# Patient Record
Sex: Female | Born: 2000
Health system: Southern US, Community
[De-identification: ages and names within clinical notes are randomized; demographics above are authoritative.]

## PROBLEM LIST (undated history)

## (undated) DIAGNOSIS — F329 Major depressive disorder, single episode, unspecified: Secondary | ICD-10-CM

## (undated) DIAGNOSIS — F32A Depression, unspecified: Secondary | ICD-10-CM

## (undated) DIAGNOSIS — N946 Dysmenorrhea, unspecified: Secondary | ICD-10-CM

## (undated) DIAGNOSIS — F419 Anxiety disorder, unspecified: Secondary | ICD-10-CM

## (undated) DIAGNOSIS — B259 Cytomegaloviral disease, unspecified: Secondary | ICD-10-CM

## (undated) HISTORY — DX: Depression, unspecified: F32.A

## (undated) HISTORY — DX: Cytomegaloviral disease, unspecified: B25.9

## (undated) HISTORY — DX: Major depressive disorder, single episode, unspecified: F32.9

## (undated) HISTORY — DX: Anxiety disorder, unspecified: F41.9

## (undated) HISTORY — PX: TONSILLECTOMY: SUR1361

---
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
## 2018-01-10 DIAGNOSIS — Z23 Encounter for immunization: Secondary | ICD-10-CM | POA: Diagnosis not present

## 2018-01-19 DIAGNOSIS — J301 Allergic rhinitis due to pollen: Secondary | ICD-10-CM | POA: Diagnosis not present

## 2018-01-19 DIAGNOSIS — J3081 Allergic rhinitis due to animal (cat) (dog) hair and dander: Secondary | ICD-10-CM | POA: Diagnosis not present

## 2018-01-19 DIAGNOSIS — J3089 Other allergic rhinitis: Secondary | ICD-10-CM | POA: Diagnosis not present

## 2018-03-10 DIAGNOSIS — J029 Acute pharyngitis, unspecified: Secondary | ICD-10-CM | POA: Diagnosis not present

## 2018-03-10 DIAGNOSIS — J069 Acute upper respiratory infection, unspecified: Secondary | ICD-10-CM | POA: Diagnosis not present

## 2018-03-11 ENCOUNTER — Emergency Department (HOSPITAL_COMMUNITY)
Admission: EM | Admit: 2018-03-11 | Discharge: 2018-03-11 | Disposition: A | Discharge status: Home / Self Care | Payer: 59 | Attending: Emergency Medicine | Admitting: Emergency Medicine

## 2018-03-11 ENCOUNTER — Other Ambulatory Visit: Payer: Self-pay

## 2018-03-11 ENCOUNTER — Encounter (HOSPITAL_COMMUNITY): Payer: Self-pay | Admitting: Emergency Medicine

## 2018-03-11 ENCOUNTER — Emergency Department (HOSPITAL_COMMUNITY): Payer: 59

## 2018-03-11 DIAGNOSIS — J111 Influenza due to unidentified influenza virus with other respiratory manifestations: Secondary | ICD-10-CM | POA: Insufficient documentation

## 2018-03-11 DIAGNOSIS — R69 Illness, unspecified: Secondary | ICD-10-CM

## 2018-03-11 DIAGNOSIS — R509 Fever, unspecified: Secondary | ICD-10-CM | POA: Diagnosis not present

## 2018-03-11 DIAGNOSIS — J029 Acute pharyngitis, unspecified: Secondary | ICD-10-CM | POA: Diagnosis not present

## 2018-03-11 DIAGNOSIS — R05 Cough: Secondary | ICD-10-CM | POA: Diagnosis not present

## 2018-03-11 HISTORY — DX: Dysmenorrhea, unspecified: N94.6

## 2018-03-11 MED ORDER — KETOROLAC TROMETHAMINE 30 MG/ML IJ SOLN
30.0000 mg | Freq: Once | INTRAMUSCULAR | Status: AC
  Administered 2018-03-11: 30 mg via INTRAVENOUS
  Filled 2018-03-11: qty 1

## 2018-03-11 MED ORDER — SODIUM CHLORIDE 0.9 % IV BOLUS
1000.0000 mL | Freq: Once | INTRAVENOUS | Status: AC
  Administered 2018-03-11: 1000 mL via INTRAVENOUS

## 2018-03-11 MED ORDER — ONDANSETRON 4 MG PO TBDP: ORAL_TABLET | ORAL | 0 refills | Status: DC

## 2018-03-11 MED ORDER — GUAIFENESIN 100 MG/5ML PO SOLN
10.0000 mL | Freq: Once | ORAL | Status: AC
  Administered 2018-03-11: 200 mg via ORAL
  Filled 2018-03-11: qty 20

## 2018-03-11 NOTE — ED Triage Notes (Signed)
Pt presents with flu like symptoms since Thursday. Patient complaining of cough with green sputum, chills, N/V and nasal congestion since Thursday. Patient complaining of tactile fever and chills.

## 2018-03-11 NOTE — Discharge Instructions (Addendum)
You have the flu this is a viral infection that will likely start to improve after 5-7 days, antibiotics are not helpful in treating viral infections.  You may use Zofran as needed for nausea. Since your symptoms have been present for more than 2 days Tamiflu will give no additional benefit.  Please make sure you are drinking plenty of fluids. You can treat your symptoms supportively with tylenol/ibuprofen for fevers and pains, Zyrtec and Flonase to heal with nasal congestion, and over the counter cough syrups and throat lozenges to help with cough. If your symptoms are not improving please follow up with you Primary doctor.  ° °If you develop persistent fevers, shortness of breath or difficulty breathing, chest pain, severe headache and neck pain, persistent nausea and vomiting or other new or concerning symptoms return to the Emergency department. ° °

## 2018-03-11 NOTE — ED Provider Notes (Signed)
[facility]-EMERGENCY DEPT Provider Note   CSN: [id number] Arrival date & time: 03/11/18  [id number]    History   Chief Complaint Chief Complaint  Patient presents with  . Flu Symptoms    HPI Julie [name] is a 17 y.o. female.  Julie [name] is a 17 y.o. Female who is otherwise healthy, presents to the emergency department for 2 days of subjective fevers, chills, myalgias, cough, rhinorrhea, sore throat, nausea with a few episodes of vomiting.  Since onset symptoms have been persistent and worsening.  She reports cough is intermittently productive of green sputum.  No chest pain or shortness of breath.  She has had 3 episodes of vomiting, no hematemesis, no abdominal pain.  She reports pain with swallowing and sore throat, saw her PCP for the symptoms yesterday and had negative strep test.  No pain in her ears.  Mild frontal headache but no neck stiffness or pain.  She is been taking Tylenol and Alka-Seltzer cold and flu with minimal improvement, denies any other aggravating or alleviating factors.  No specific sick contacts noted although she does work in [employer].     Past Medical History:  Diagnosis Date  . Dysmenorrhea     There are no active problems to display for this patient.   Past Surgical History:  Procedure Laterality Date  . TONSILLECTOMY       OB History   None      Home Medications    Prior to Admission medications   Not on File    Family History No family history on file.  Social History Social History   Tobacco Use  . Smoking status: Never Smoker  . Smokeless tobacco: Never Used  Substance Use Topics  . Alcohol use: Not Currently  . Drug use: Not Currently     Allergies   Patient has no known allergies.   Review of Systems Review of Systems  Constitutional: Positive for chills and fever.  HENT: Positive for congestion, rhinorrhea and sore throat. Negative for ear pain.   Eyes: Negative for visual disturbance.    Respiratory: Positive for cough. Negative for shortness of breath.   Cardiovascular: Negative for chest pain.  Gastrointestinal: Negative for abdominal pain, nausea and vomiting.  Genitourinary: Negative for dysuria and frequency.  Musculoskeletal: Negative for arthralgias and myalgias.  Skin: Negative for color change and rash.  Neurological: Negative for dizziness, syncope and headaches.     Physical Exam Updated Vital Signs BP (!) 152/107 (BP Location: Left Arm)   Pulse (!) 123   Temp 99.7 F (37.6 C) (Oral)   Resp (!) 30   LMP 02/15/2018   SpO2 97%   Physical Exam  Constitutional: She appears well-developed and well-nourished. No distress.  HENT:  Head: Normocephalic and atraumatic.  Mouth/Throat: Oropharynx is clear and moist.  TMs clear with good landmarks, moderate nasal mucosa edema with clear rhinorrhea, posterior oropharynx clear and moist, with some erythema, no edema or exudates  Eyes: Right eye exhibits no discharge. Left eye exhibits no discharge.  Neck: Neck supple.  No rigidity  Cardiovascular: Regular rhythm, normal heart sounds and intact distal pulses. Exam reveals no gallop and no friction rub.  No murmur heard. Tachycardic  Pulmonary/Chest: Effort normal and breath sounds normal. No respiratory distress.  Respirations equal and unlabored, patient able to speak in full sentences, lungs clear to auscultation bilaterally  Abdominal: Soft. Bowel sounds are normal. She exhibits no distension and no mass. There is no tenderness. There is  no guarding.  Abdomen soft, nondistended, nontender to palpation in all quadrants without guarding or peritoneal signs  Musculoskeletal: She exhibits no edema or deformity.  Neurological: She is alert. Coordination normal.  Skin: Skin is warm and dry. She is not diaphoretic.  Psychiatric: She has a normal mood and affect. Her behavior is normal.  Nursing note and vitals reviewed.    ED Treatments / Results  Labs (all  labs ordered are listed, but only abnormal results are displayed) Labs Reviewed - No data to display  EKG None  Radiology Dg Chest 2 View  Result Date: 03/11/2018 CLINICAL DATA:  17 year old female with cough. EXAM: CHEST - 2 VIEW COMPARISON:  None. FINDINGS: The heart size and mediastinal contours are within normal limits. Both lungs are clear. The visualized skeletal structures are unremarkable. IMPRESSION: No active cardiopulmonary disease. Electronically Signed   By: Elgie Collard M.D.   On: 03/11/2018 05:54    Procedures Procedures (including critical care time)  Medications Ordered in ED Medications  sodium chloride 0.9 % bolus 1,000 mL (1,000 mLs Intravenous New Bag/Given 03/11/18 0542)  ketorolac (TORADOL) 30 MG/ML injection 30 mg (30 mg Intravenous Given 03/11/18 0542)  guaiFENesin (ROBITUSSIN) 100 MG/5ML solution 200 mg (200 mg Oral Given 03/11/18 0532)     Initial Impression / Assessment and Plan / ED Course  I have reviewed the triage vital signs and the nursing notes.  Pertinent labs & imaging results that were available during my care of the patient were reviewed by me and considered in my medical decision making (see chart for details).  Patient with symptoms consistent with influenza.  Patient initially tachycardic with, low-grade fever. Improved with medication.  IV fluids given, pt tolerating PO.  Vitals significantly improved after treatment.  Lungs are clear. CXR without signs of pneumonia, or other active cardiopulmonary disease. The patient understands that symptoms are greater than the recommended 24-48 hour window of treatment, no tamiflu provided.  Patient will be discharged with instructions to orally hydrate, rest, and use over-the-counter medications such as anti-inflammatories ibuprofen and Aleve for muscle aches and Tylenol for fever.  Patient will also be given zofran for nausea.  Return precautions discussed.  Patient expresses understanding and is in  agreement with plan.  Stable for discharge home.    Final Clinical Impressions(s) / ED Diagnoses   Final diagnoses:  Influenza-like illness    ED Discharge Orders         Ordered    ondansetron (ZOFRAN ODT) 4 MG disintegrating tablet     03/11/18 0613           Dartha Lodge, PA-C 03/11/18 1610    Geoffery Lyons, MD 03/11/18 [phone]

## 2018-03-12 DIAGNOSIS — J029 Acute pharyngitis, unspecified: Secondary | ICD-10-CM | POA: Diagnosis not present

## 2018-03-14 ENCOUNTER — Emergency Department (HOSPITAL_COMMUNITY)
Admission: EM | Admit: 2018-03-14 | Discharge: 2018-03-14 | Disposition: A | Discharge status: Home / Self Care | Payer: 59 | Attending: Emergency Medicine | Admitting: Emergency Medicine

## 2018-03-14 ENCOUNTER — Encounter (HOSPITAL_COMMUNITY): Payer: Self-pay

## 2018-03-14 ENCOUNTER — Other Ambulatory Visit: Payer: Self-pay

## 2018-03-14 DIAGNOSIS — Z79899 Other long term (current) drug therapy: Secondary | ICD-10-CM | POA: Insufficient documentation

## 2018-03-14 DIAGNOSIS — M791 Myalgia, unspecified site: Secondary | ICD-10-CM | POA: Insufficient documentation

## 2018-03-14 DIAGNOSIS — R0981 Nasal congestion: Secondary | ICD-10-CM | POA: Diagnosis not present

## 2018-03-14 DIAGNOSIS — J029 Acute pharyngitis, unspecified: Secondary | ICD-10-CM

## 2018-03-14 DIAGNOSIS — R110 Nausea: Secondary | ICD-10-CM | POA: Insufficient documentation

## 2018-03-14 DIAGNOSIS — J028 Acute pharyngitis due to other specified organisms: Secondary | ICD-10-CM | POA: Diagnosis not present

## 2018-03-14 HISTORY — DX: Dysmenorrhea, unspecified: N94.6

## 2018-03-14 LAB — GROUP A STREP BY PCR: Group A Strep by PCR: NOT DETECTED

## 2018-03-14 MED ORDER — KETOROLAC TROMETHAMINE 30 MG/ML IJ SOLN
30.0000 mg | Freq: Once | INTRAMUSCULAR | Status: AC
  Administered 2018-03-14: 30 mg via INTRAVENOUS
  Filled 2018-03-14: qty 1

## 2018-03-14 MED ORDER — SODIUM CHLORIDE 0.9 % IV BOLUS
1000.0000 mL | Freq: Once | INTRAVENOUS | Status: AC
  Administered 2018-03-14: 1000 mL via INTRAVENOUS

## 2018-03-14 MED ORDER — LIDOCAINE VISCOUS HCL 2 % MT SOLN: 15.0000 mL | OROMUCOSAL | 0 refills | Status: DC | PRN

## 2018-03-14 NOTE — Discharge Instructions (Signed)
Your strep test was negative. Swish and spit lidocaine solution help with throat discomfort.  Do not swallow. Return to ED for worsening symptoms, trouble breathing or trouble swallowing, coughing or vomiting up blood, lightheadedness or loss of consciousness.

## 2018-03-14 NOTE — ED Triage Notes (Signed)
Patient c/o sore throat and nasal congestion x 5 days.

## 2018-03-14 NOTE — ED Provider Notes (Signed)
[facility]-EMERGENCY DEPT Provider Note   CSN: [id number] Arrival date & time: 03/14/18  1419     History   Chief Complaint Chief Complaint  Patient presents with  . Sore Throat  . Nasal Congestion    HPI Julie [name] is a 17 y.o. female who presents to ED for 5-day history of sore throat, generalized body aches, nasal congestion, subjective fever.  Patient seen and evaluated prior to my evaluation and was told she had flu symptoms.  She was given Augmentin.  She is here today because the pain in her throat has not resolved and she has decreased p.o. intake as a result of it.  Reports nausea but no vomiting.  No sick contacts with similar symptoms.  Denies any trouble breathing or trouble swallowing, wheezing, hemoptysis.  HPI  Past Medical History:  Diagnosis Date  . Painful menstrual periods     There are no active problems to display for this patient.   Past Surgical History:  Procedure Laterality Date  . TONSILLECTOMY       OB History   None      Home Medications    Prior to Admission medications   Medication Sig Start Date End Date Taking? Authorizing Provider  acetaminophen (TYLENOL) 500 MG tablet Take 1,000 mg by mouth daily as needed for mild pain or fever.   Yes [provider]  amoxicillin-clavulanate (AUGMENTIN) 875-125 MG tablet Take 1 tablet by mouth 2 (two) times daily. 03/12/18  Yes [provider]  cetirizine (ZYRTEC) 10 MG tablet Take 10 mg by mouth daily. 01/19/18  Yes [provider]  ibuprofen (ADVIL,MOTRIN) 200 MG tablet Take 800 mg by mouth daily as needed for fever or moderate pain.   Yes [provider]  menthol-cetylpyridinium (CEPACOL) 3 MG lozenge Take 1 lozenge by mouth as needed for sore throat.   Yes [provider]  Pseudoephedrine-APAP-DM (TYLENOL FLU PO) Take 2 tablets by mouth daily.   Yes [provider]  ibuprofen (ADVIL,MOTRIN) 800 MG tablet Take 1 tablet  (800 mg total) by mouth every 8 (eight) hours as needed for mild pain. Patient not taking: Reported on 03/14/2018 09/03/14   Ward, Layla Maw, DO  lidocaine (XYLOCAINE) 2 % solution Use as directed 15 mLs in the mouth or throat as needed for mouth pain. 03/14/18   Abrham Maslowski, PA-C  ondansetron (ZOFRAN ODT) 4 MG disintegrating tablet Take 1 tablet (4 mg total) by mouth every 8 (eight) hours as needed for nausea or vomiting. Patient not taking: Reported on 03/14/2018 09/03/14   Ward, Layla Maw, DO  traMADol (ULTRAM) 50 MG tablet Take 1 tablet (50 mg total) by mouth every 6 (six) hours as needed. Patient not taking: Reported on 03/14/2018 09/03/14   Ward, Layla Maw, DO    Family History Family History  Problem Relation Age of Onset  . Hypertension Mother   . Chronic Renal Failure Father     Social History Social History   Tobacco Use  . Smoking status: Never Smoker  . Smokeless tobacco: Never Used  Substance Use Topics  . Alcohol use: Never    Frequency: Never  . Drug use: Never     Allergies   Patient has no known allergies.   Review of Systems Review of Systems  Constitutional: Positive for chills. Negative for appetite change and fever.  HENT: Positive for congestion, rhinorrhea and sore throat. Negative for ear pain and sneezing.   Eyes: Negative for photophobia and visual  disturbance.  Respiratory: Positive for cough. Negative for chest tightness, shortness of breath and wheezing.   Cardiovascular: Negative for chest pain and palpitations.  Gastrointestinal: Positive for nausea. Negative for abdominal pain, blood in stool, constipation, diarrhea and vomiting.  Genitourinary: Negative for dysuria, hematuria and urgency.  Musculoskeletal: Positive for myalgias.  Skin: Negative for rash.  Neurological: Negative for dizziness, weakness and light-headedness.     Physical Exam Updated Vital Signs BP 117/81 (BP Location: Left Arm)   Pulse 100   Temp 99.4 F (37.4 C) (Oral)    Resp 16   Ht 5\' 6"  (1.676 m)   Wt 106.1 kg   LMP 02/15/2018 (Approximate)   SpO2 100%   BMI 37.77 kg/m   Physical Exam  Constitutional: She appears well-developed and well-nourished. No distress.  HENT:  Head: Normocephalic and atraumatic.  Nose: Nose normal.  Mouth/Throat: Uvula is midline and oropharynx is clear and moist. Tonsils are 1+ on the right. Tonsils are 1+ on the left. No tonsillar exudate.  Patient does not appear to be in acute distress. No trismus or drooling present. No pooling of secretions. Patient is tolerating secretions and is not in respiratory distress. No neck pain or tenderness to palpation of the neck. Full active and passive range of motion of the neck. No evidence of RPA or PTA.  Eyes: Conjunctivae and EOM are normal. Left eye exhibits no discharge. No scleral icterus.  Neck: Normal range of motion. Neck supple.  Cardiovascular: Regular rhythm, normal heart sounds and intact distal pulses. Tachycardia present. Exam reveals no gallop and no friction rub.  No murmur heard. Pulmonary/Chest: Effort normal and breath sounds normal. No respiratory distress.  Abdominal: Soft. Bowel sounds are normal. She exhibits no distension. There is no tenderness. There is no guarding.  Musculoskeletal: Normal range of motion. She exhibits no edema.  Neurological: She is alert. She exhibits normal muscle tone. Coordination normal.  Skin: Skin is warm and dry. No rash noted.  Psychiatric: She has a normal mood and affect.  Nursing note and vitals reviewed.    ED Treatments / Results  Labs (all labs ordered are listed, but only abnormal results are displayed) Labs Reviewed  GROUP A STREP BY PCR    EKG None  Radiology No results found.  Procedures Procedures (including critical care time)  Medications Ordered in ED Medications  sodium chloride 0.9 % bolus 1,000 mL (0 mLs Intravenous Stopped 03/14/18 1922)  ketorolac (TORADOL) 30 MG/ML injection 30 mg (30 mg  Intravenous Given 03/14/18 1802)     Initial Impression / Assessment and Plan / ED Course  I have reviewed the triage vital signs and the nursing notes.  Pertinent labs & imaging results that were available during my care of the patient were reviewed by me and considered in my medical decision making (see chart for details).     17 year old female presents for ongoing sore throat, nasal congestion and body aches.  She is already on Augmentin prescribed by another provider.  Patient initially tachycardic to 120s on arrival.  This improved with Toradol and IV fluids.  Suspect secondary to possible dehydration from decreased p.o. intake due to sore throat.  Pt rapid strep test negative. Pt is tolerating secretions, not in respiratory distress, no neck pain, no trismus. Presentation not concerning for peritonsillar abscess or spread of infection to deep spaces of the throat; patent airway. Pt will be discharged with lidocaine to swish and spit. Ibuprofen or Tylenol as needed for pain/fever. Specific return  precautions discussed. Recommended PCP follow up. Pt appears safe for discharge.    Portions of this note were generated with Scientist, clinical (histocompatibility and immunogenetics)Dragon dictation software. Dictation errors may occur despite best attempts at proofreading.  Final Clinical Impressions(s) / ED Diagnoses   Final diagnoses:  Viral pharyngitis    ED Discharge Orders         Ordered    lidocaine (XYLOCAINE) 2 % solution  As needed     03/14/18 1926           Dietrich PatesKhatri, Galo Sayed, PA-C 03/14/18 1930    Linwood DibblesKnapp, Jon, MD 03/17/18 1318

## 2018-03-16 ENCOUNTER — Encounter (HOSPITAL_COMMUNITY): Payer: Self-pay

## 2018-03-22 DIAGNOSIS — R509 Fever, unspecified: Secondary | ICD-10-CM | POA: Diagnosis not present

## 2018-03-22 DIAGNOSIS — R112 Nausea with vomiting, unspecified: Secondary | ICD-10-CM | POA: Diagnosis not present

## 2018-03-23 DIAGNOSIS — R509 Fever, unspecified: Secondary | ICD-10-CM | POA: Diagnosis not present

## 2018-04-06 DIAGNOSIS — R509 Fever, unspecified: Secondary | ICD-10-CM | POA: Diagnosis not present

## 2018-04-06 DIAGNOSIS — R634 Abnormal weight loss: Secondary | ICD-10-CM | POA: Diagnosis not present

## 2018-04-06 DIAGNOSIS — R112 Nausea with vomiting, unspecified: Secondary | ICD-10-CM | POA: Diagnosis not present

## 2018-04-11 ENCOUNTER — Encounter (HOSPITAL_COMMUNITY): Payer: Self-pay | Admitting: *Deleted

## 2018-04-11 ENCOUNTER — Inpatient Hospital Stay (HOSPITAL_COMMUNITY)
Admission: RE | Admit: 2018-04-11 | Discharge: 2018-04-13 | DRG: 885 | Disposition: A | Discharge status: Home / Self Care | Payer: 59 | Attending: Psychiatry | Admitting: Psychiatry

## 2018-04-11 ENCOUNTER — Other Ambulatory Visit: Payer: Self-pay

## 2018-04-11 DIAGNOSIS — F1210 Cannabis abuse, uncomplicated: Secondary | ICD-10-CM | POA: Diagnosis present

## 2018-04-11 DIAGNOSIS — F419 Anxiety disorder, unspecified: Secondary | ICD-10-CM | POA: Diagnosis present

## 2018-04-11 DIAGNOSIS — N390 Urinary tract infection, site not specified: Secondary | ICD-10-CM | POA: Diagnosis present

## 2018-04-11 DIAGNOSIS — R112 Nausea with vomiting, unspecified: Secondary | ICD-10-CM | POA: Diagnosis present

## 2018-04-11 DIAGNOSIS — F332 Major depressive disorder, recurrent severe without psychotic features: Secondary | ICD-10-CM | POA: Diagnosis present

## 2018-04-11 DIAGNOSIS — R45851 Suicidal ideations: Secondary | ICD-10-CM | POA: Diagnosis present

## 2018-04-11 DIAGNOSIS — G4700 Insomnia, unspecified: Secondary | ICD-10-CM | POA: Diagnosis present

## 2018-04-11 LAB — COMPREHENSIVE METABOLIC PANEL
ALK PHOS: 43 U/L (ref 38–126)
ALT: 14 U/L (ref 0–44)
AST: 11 U/L — ABNORMAL LOW (ref 15–41)
Albumin: 3.1 g/dL — ABNORMAL LOW (ref 3.5–5.0)
Anion gap: 5 (ref 5–15)
BUN: 7 mg/dL (ref 6–20)
CALCIUM: 8.5 mg/dL — AB (ref 8.9–10.3)
CO2: 23 mmol/L (ref 22–32)
CREATININE: 0.67 mg/dL (ref 0.44–1.00)
Chloride: 110 mmol/L (ref 98–111)
GFR calc non Af Amer: >60 mL/min (ref >60)
GLUCOSE: 103 mg/dL — AB (ref 70–99)
Potassium: 3.8 mmol/L (ref 3.5–5.1)
SODIUM: 138 mmol/L (ref 135–145)
Total Bilirubin: 0.1 mg/dL — ABNORMAL LOW (ref 0.3–1.2)
Total Protein: 7.1 g/dL (ref 6.5–8.1)

## 2018-04-11 LAB — CBC
HCT: 31.8 % — ABNORMAL LOW (ref 36.0–46.0)
HEMOGLOBIN: 9.5 g/dL — AB (ref 12.0–15.0)
MCH: 24.4 pg — ABNORMAL LOW (ref 26.0–34.0)
MCHC: 29.9 g/dL — ABNORMAL LOW (ref 30.0–36.0)
MCV: 81.5 fL (ref 80.0–100.0)
Platelets: 316 10*3/uL (ref 150–400)
RBC: 3.9 MIL/uL (ref 3.87–5.11)
RDW: 14.7 % (ref 11.5–15.5)
WBC: 7.9 10*3/uL (ref 4.0–10.5)
nRBC: 0 % (ref 0.0–0.2)

## 2018-04-11 LAB — URINALYSIS, ROUTINE W REFLEX MICROSCOPIC
Bilirubin Urine: NEGATIVE
GLUCOSE, UA: NEGATIVE mg/dL
Ketones, ur: NEGATIVE mg/dL
NITRITE: NEGATIVE
PH: 6 (ref 5.0–8.0)
Protein, ur: NEGATIVE mg/dL
SPECIFIC GRAVITY, URINE: 1.021 (ref 1.005–1.030)

## 2018-04-11 LAB — RAPID URINE DRUG SCREEN, HOSP PERFORMED
AMPHETAMINES: NOT DETECTED
BARBITURATES: NOT DETECTED
Benzodiazepines: NOT DETECTED
COCAINE: NOT DETECTED
Opiates: NOT DETECTED
Tetrahydrocannabinol: POSITIVE — AB

## 2018-04-11 LAB — LIPID PANEL
Cholesterol: 177 mg/dL (ref 0–200)
HDL: 40 mg/dL — ABNORMAL LOW (ref >40)
LDL CALC: 116 mg/dL — AB (ref 0–99)
TRIGLYCERIDES: 107 mg/dL (ref <150)
Total CHOL/HDL Ratio: 4.4 RATIO
VLDL: 21 mg/dL (ref 0–40)

## 2018-04-11 LAB — TSH: TSH: 2.682 u[IU]/mL (ref 0.350–4.500)

## 2018-04-11 LAB — PREGNANCY, URINE: Preg Test, Ur: NEGATIVE

## 2018-04-11 MED ORDER — ONDANSETRON HCL 4 MG PO TABS
4.0000 mg | ORAL_TABLET | Freq: Three times a day (TID) | ORAL | Status: DC | PRN
  Administered 2018-04-11: 4 mg via ORAL
  Filled 2018-04-11: qty 1

## 2018-04-11 MED ORDER — INFLUENZA VAC SPLIT QUAD 0.5 ML IM SUSY
0.5000 mL | PREFILLED_SYRINGE | INTRAMUSCULAR | Status: DC
  Filled 2018-04-11: qty 0.5

## 2018-04-11 MED ORDER — ACETAMINOPHEN 325 MG PO TABS: 650.0000 mg | ORAL_TABLET | Freq: Four times a day (QID) | ORAL | Status: DC | PRN

## 2018-04-11 MED ORDER — NORGESTIM-ETH ESTRAD TRIPHASIC 0.18/0.215/0.25 MG-35 MCG PO TABS
1.0000 | ORAL_TABLET | Freq: Every day | ORAL | Status: DC
  Administered 2018-04-11 – 2018-04-12 (×2): 1 via ORAL

## 2018-04-11 MED ORDER — HYDROXYZINE HCL 25 MG PO TABS
25.0000 mg | ORAL_TABLET | Freq: Four times a day (QID) | ORAL | Status: DC | PRN
  Administered 2018-04-11 – 2018-04-13 (×5): 25 mg via ORAL
  Filled 2018-04-11 (×5): qty 1

## 2018-04-11 MED ORDER — MAGNESIUM HYDROXIDE 400 MG/5ML PO SUSP: 30.0000 mL | Freq: Every day | ORAL | Status: DC | PRN

## 2018-04-11 MED ORDER — TRAZODONE HCL 50 MG PO TABS
50.0000 mg | ORAL_TABLET | Freq: Every evening | ORAL | Status: DC | PRN
  Administered 2018-04-11: 50 mg via ORAL
  Filled 2018-04-11 (×4): qty 1

## 2018-04-11 MED ORDER — ESCITALOPRAM OXALATE 5 MG PO TABS
5.0000 mg | ORAL_TABLET | Freq: Every day | ORAL | Status: DC
  Administered 2018-04-11 – 2018-04-13 (×3): 5 mg via ORAL
  Filled 2018-04-11 (×6): qty 1

## 2018-04-11 MED ORDER — NON FORMULARY: Freq: Every day | Status: DC

## 2018-04-11 MED ORDER — ALUM & MAG HYDROXIDE-SIMETH 200-200-20 MG/5ML PO SUSP: 30.0000 mL | ORAL | Status: DC | PRN

## 2018-04-11 NOTE — H&P (Signed)
Psychiatric Admission Assessment Adult  Patient Identification: Julie [name] MRN:  [id number] Date of Evaluation:  04/11/2018 Chief Complaint:  mdd Principal Diagnosis: <principal problem not specified> Diagnosis:   Patient Active Problem List   Diagnosis Date Noted  . MDD (major depressive disorder), recurrent episode, severe ([place]) [F33.2] 04/11/2018   History of Present Illness: Patient is seen and examined.  Patient is a 17 year old female with essentially negative past psychiatric history who presented to the behavioral health hospital as a walk-in.  Patient stated that she had been increasingly more depressed over the last several weeks, and began to think about killing herself.  She stated she was thinking about jumping off the back daily at her apartment.  She stated that it started approximately 2 weeks ago.  She is been having difficulty with nausea and vomiting.  They have been unable to come up with a cause for this.  She stated because she had been ill she had to drop out of college, and that bother her a great deal.  She admitted to helplessness, hopelessness and worthlessness.  She she admitted to crying spells.  She was admitted to the hospital for evaluation and stabilization.  Associated Signs/Symptoms: Depression Symptoms:  depressed mood, anhedonia, insomnia, psychomotor retardation, fatigue, feelings of worthlessness/guilt, difficulty concentrating, hopelessness, suicidal thoughts without plan, anxiety, loss of energy/fatigue, disturbed sleep, (Hypo) Manic Symptoms:  Impulsivity, Anxiety Symptoms:  Excessive Worry, Psychotic Symptoms:  Denied PTSD Symptoms: Negative Total Time spent with patient: 30 minutes  Past Psychiatric History: Patient admitted to one previous suicidal gesture.  She has had counseling in the past.  No medications.  Is the patient at risk to self? Yes.    Has the patient been a risk to self in the past 6 months? Yes.    Has the patient  been a risk to self within the distant past? Yes.    Is the patient a risk to others? No.  Has the patient been a risk to others in the past 6 months? No.  Has the patient been a risk to others within the distant past? No.   Prior Inpatient Therapy: Prior Inpatient Therapy: No Prior Outpatient Therapy: Prior Outpatient Therapy: No Does patient have an ACCT team?: No Does patient have Intensive In-House Services?  : No Does patient have [facility] services? : No Does patient have P4CC services?: No  Alcohol Screening: 1. How often do you have a drink containing alcohol?: Never 2. How many drinks containing alcohol do you have on a typical day when you are drinking?: 1 or 2(doesn't drink alcohol) 3. How often do you have six or more drinks on one occasion?: Never AUDIT-C Score: 0 9. Have you or someone else been injured as a result of your drinking?: No 10. Has a relative or friend or a doctor or another health worker been concerned about your drinking or suggested you cut down?: No Alcohol Use Disorder Identification Test Final Score (AUDIT): 0 Intervention/Follow-up: AUDIT Score <7 follow-up not indicated Substance Abuse History in the last 12 months:  No. Consequences of Substance Abuse: Negative Previous Psychotropic Medications: No  Psychological Evaluations: No  Past Medical History:  Past Medical History:  Diagnosis Date  . Dysmenorrhea   . Painful menstrual periods     Past Surgical History:  Procedure Laterality Date  . TONSILLECTOMY     Family History:  Family History  Problem Relation Age of Onset  . Hypertension Mother   . Chronic Renal Failure Father  Family Psychiatric  History: Patient stated her father had psychiatric issues. Tobacco Screening: Have you used any form of tobacco in the last 30 days? (Cigarettes, Smokeless Tobacco, Cigars, and/or Pipes): No Social History:  Social History   Substance and Sexual Activity  Alcohol Use Never  . Frequency: Never      Social History   Substance and Sexual Activity  Drug Use Never    Additional Social History: Marital status: Single Are you sexually active?: No What is your sexual orientation?: Heterosexual  Has your sexual activity been affected by drugs, alcohol, medication, or emotional stress?: No  Does patient have children?: No    Pain Medications: denies Prescriptions: denies Over the Counter: denies History of alcohol / drug use?: No history of alcohol / drug abuse Longest period of sobriety (when/how long): N/A                    Allergies:  No Known Allergies Lab Results:  Results for orders placed or performed during the hospital encounter of 04/11/18 (from the past 48 hour(s))  CBC     Status: Abnormal   Collection Time: 04/11/18  6:28 AM  Result Value Ref Range   WBC 7.9 4.0 - 10.5 K/uL   RBC 3.90 3.87 - 5.11 MIL/uL   Hemoglobin 9.5 (L) 12.0 - 15.0 g/dL   HCT 31.8 (L) 36.0 - 46.0 %   MCV 81.5 80.0 - 100.0 fL   MCH 24.4 (L) 26.0 - 34.0 pg   MCHC 29.9 (L) 30.0 - 36.0 g/dL   RDW 14.7 11.5 - 15.5 %   Platelets 316 150 - 400 K/uL   nRBC 0.0 0.0 - 0.2 %    Comment: Performed at [facility], [place] [address], [place], [place] [address]  Comprehensive metabolic panel     Status: Abnormal   Collection Time: 04/11/18  6:28 AM  Result Value Ref Range   Sodium 138 135 - 145 mmol/L   Potassium 3.8 3.5 - 5.1 mmol/L   Chloride 110 98 - 111 mmol/L   CO2 23 22 - 32 mmol/L   Glucose, Bld 103 (H) 70 - 99 mg/dL   BUN 7 6 - 20 mg/dL   Creatinine, Ser 0.67 0.44 - 1.00 mg/dL   Calcium 8.5 (L) 8.9 - 10.3 mg/dL   Total Protein 7.1 6.5 - 8.1 g/dL   Albumin 3.1 (L) 3.5 - 5.0 g/dL   AST 11 (L) 15 - 41 U/L   ALT 14 0 - 44 U/L   Alkaline Phosphatase 43 38 - 126 U/L   Total Bilirubin 0.1 (L) 0.3 - 1.2 mg/dL   GFR calc non Af Amer >60 >60 mL/min   GFR calc Af Amer >60 >60 mL/min    Comment: (NOTE) The eGFR has been calculated using the CKD EPI equation. This  calculation has not been validated in all clinical situations. eGFR's persistently <60 mL/min signify possible Chronic Kidney Disease.    Anion gap 5 5 - 15    Comment: Performed at [facility], [place] [address] [place], [place] [address]  Lipid panel     Status: Abnormal   Collection Time: 04/11/18  6:28 AM  Result Value Ref Range   Cholesterol 177 0 - 200 mg/dL   Triglycerides 107 <150 mg/dL   HDL 40 (L) >40 mg/dL   Total CHOL/HDL Ratio 4.4 RATIO   VLDL 21 0 - 40 mg/dL   LDL Cholesterol 116 (H) 0 - 99 mg/dL  Comment:        Total Cholesterol/HDL:CHD Risk Coronary Heart Disease Risk Table                     Men   Women  1/2 Average Risk   3.4   3.3  Average Risk       5.0   4.4  2 X Average Risk   9.6   7.1  3 X Average Risk  23.4   11.0        Use the calculated Patient Ratio above and the CHD Risk Table to determine the patient's CHD Risk.        ATP III CLASSIFICATION (LDL):  <100     mg/dL   Optimal  100-129  mg/dL   Near or Above                    Optimal  130-159  mg/dL   Borderline  160-189  mg/dL   High  >190     mg/dL   Very High Performed at [facility] [address] [place], [place] [address]   TSH     Status: None   Collection Time: 04/11/18  6:28 AM  Result Value Ref Range   TSH 2.682 0.350 - 4.500 uIU/mL    Comment: Performed by a 3rd Generation assay with a functional sensitivity of <=0.01 uIU/mL. Performed at [facility], [place] [address] [place], [place] [address]     Blood Alcohol level:  No results found for: [facility]  Metabolic Disorder Labs:  No results found for: HGBA1C, MPG No results found for: PROLACTIN Lab Results  Component Value Date   CHOL 177 04/11/2018   TRIG 107 04/11/2018   HDL 40 (L) 04/11/2018   CHOLHDL 4.4 04/11/2018   VLDL 21 04/11/2018   LDLCALC 116 (H) 04/11/2018    Current Medications: Current Facility-Administered Medications  Medication Dose Route  Frequency Provider Last Rate Last Dose  . acetaminophen (TYLENOL) tablet 650 mg  650 mg Oral Q6H PRN Laverle Hobby, PA-C      . alum & mag hydroxide-simeth (MAALOX/MYLANTA) 200-200-20 MG/5ML suspension 30 mL  30 mL Oral Q4H PRN Patriciaann Clan E, PA-C      . escitalopram (LEXAPRO) tablet 5 mg  5 mg Oral Daily Sharma Covert, MD   5 mg at 04/11/18 0839  . hydrOXYzine (ATARAX/VISTARIL) tablet 25 mg  25 mg Oral Q6H PRN Laverle Hobby, PA-C   25 mg at 04/11/18 1109  . [START ON 04/12/2018] Influenza vac split quadrivalent PF (FLUARIX) injection 0.5 mL  0.5 mL Intramuscular Tomorrow-1000 Cobos, Fernando A, MD      . magnesium hydroxide (MILK OF MAGNESIA) suspension 30 mL  30 mL Oral Daily PRN Patriciaann Clan E, PA-C      . ondansetron (ZOFRAN) tablet 4 mg  4 mg Oral Q8H PRN Sharma Covert, MD   4 mg at 04/11/18 1122  . traZODone (DESYREL) tablet 50 mg  50 mg Oral QHS,MR X 1 Simon, Spencer E, PA-C       PTA Medications: Medications Prior to Admission  Medication Sig Dispense Refill Last Dose  . acetaminophen (TYLENOL) 500 MG tablet Take 1,000 mg by mouth daily as needed for mild pain or fever.   03/13/2018 at Unknown time  . amoxicillin-clavulanate (AUGMENTIN) 875-125 MG tablet Take 1 tablet by mouth 2 (two) times daily.  0 03/13/2018 at Unknown time  . cetirizine (ZYRTEC)  10 MG tablet Take 10 mg by mouth daily.  5 Past Week at Unknown time  . ibuprofen (ADVIL,MOTRIN) 200 MG tablet Take 800 mg by mouth daily as needed for fever or moderate pain.   03/13/2018 at Unknown time  . ibuprofen (ADVIL,MOTRIN) 800 MG tablet Take 1 tablet (800 mg total) by mouth every 8 (eight) hours as needed for mild pain. (Patient not taking: Reported on 03/14/2018) 30 tablet 0 Not Taking at Unknown time  . lidocaine (XYLOCAINE) 2 % solution Use as directed 15 mLs in the mouth or throat as needed for mouth pain. 100 mL 0   . menthol-cetylpyridinium (CEPACOL) 3 MG lozenge Take 1 lozenge by mouth as needed for sore  throat.   Past Week at Unknown time  . ondansetron (ZOFRAN ODT) 4 MG disintegrating tablet Take 1 tablet (4 mg total) by mouth every 8 (eight) hours as needed for nausea or vomiting. (Patient not taking: Reported on 03/14/2018) 20 tablet 0 Not Taking at Unknown time  . ondansetron (ZOFRAN ODT) 4 MG disintegrating tablet 43m ODT q4 hours prn nausea/vomit (Patient taking differently: 8 mg 3 (three) times daily as needed for nausea or vomiting. 474mODT q4 hours prn nausea/vomit) 8 tablet 0   . Pseudoephedrine-APAP-DM (TYLENOL FLU PO) Take 2 tablets by mouth daily.   03/13/2018 at Unknown time  . traMADol (ULTRAM) 50 MG tablet Take 1 tablet (50 mg total) by mouth every 6 (six) hours as needed. (Patient not taking: Reported on 03/14/2018) 15 tablet 0 Not Taking at Unknown time    Musculoskeletal: Strength & Muscle Tone: within normal limits Gait & Station: normal Patient leans: N/A  Psychiatric Specialty Exam: Physical Exam  Nursing note and vitals reviewed. Constitutional: She is oriented to person, place, and time. She appears well-developed and well-nourished.  HENT:  Head: Normocephalic and atraumatic.  Respiratory: Effort normal.  Neurological: She is alert and oriented to person, place, and time.    ROS  Blood pressure 139/84, pulse 80, temperature 98 F (36.7 C), temperature source Oral, resp. rate 16, height 5' 6"  (1.676 m), weight 103.4 kg.Body mass index is 36.8 kg/m.  General Appearance: Disheveled  Eye Contact:  Fair  Speech:  Normal Rate  Volume:  Decreased  Mood:  Anxious and Depressed  Affect:  Congruent  Thought Process:  Coherent and Descriptions of Associations: Intact  Orientation:  Full (Time, Place, and Person)  Thought Content:  Logical  Suicidal Thoughts:  Yes.  without intent/plan  Homicidal Thoughts:  No  Memory:  Immediate;   Fair Recent;   Fair Remote;   Fair  Judgement:  Intact  Insight:  Fair  Psychomotor Activity:  Psychomotor Retardation   Concentration:  Concentration: Fair and Attention Span: Fair  Recall:  [employer] Knowledge:  Fair  Language:  Fair  Akathisia:  Negative  Handed:  Right  AIMS (if indicated):     Assets:  Desire for Improvement Financial Resources/Insurance Housing Physical Health Resilience Social Support Talents/Skills  ADL's:  Intact  Cognition:  WNL  Sleep:  Number of Hours: 2.5    Treatment Plan Summary: Daily contact with patient to assess and evaluate symptoms and progress in treatment, Medication management and Plan : Patient is seen and examined.  Patient is a [age]ear old female with essentially negative past psychiatric history with the first episode of major depression.  She will be admitted to the hospital.  She will be integrated into the milieu.  She will be encouraged to attend groups.  She will be started on Lexapro 5 mg p.o. daily.  She will also have available hydroxyzine 25 mg p.o. every 6 hours as needed anxiety.  She will be given Zofran 4 mg p.o. every 6 hours as needed nausea.  Observation Level/Precautions:  15 minute checks  Laboratory:  Chemistry Profile  Psychotherapy:    Medications:    Consultations:    Discharge Concerns:    Estimated LOS:  Other:     Physician Treatment Plan for Primary Diagnosis: <principal problem not specified> Long Term Goal(s): Improvement in symptoms so as ready for discharge  Short Term Goals: Ability to identify changes in lifestyle to reduce recurrence of condition will improve, Ability to verbalize feelings will improve, Ability to disclose and discuss suicidal ideas, Ability to demonstrate self-control will improve, Ability to identify and develop effective coping behaviors will improve and Ability to maintain clinical measurements within normal limits will improve  Physician Treatment Plan for Secondary Diagnosis: Active Problems:   MDD (major depressive disorder), recurrent episode, severe ([place])  Long Term Goal(s): Improvement  in symptoms so as ready for discharge  Short Term Goals: Ability to identify changes in lifestyle to reduce recurrence of condition will improve, Ability to verbalize feelings will improve, Ability to disclose and discuss suicidal ideas, Ability to demonstrate self-control will improve, Ability to identify and develop effective coping behaviors will improve and Ability to maintain clinical measurements within normal limits will improve  I certify that inpatient services furnished can reasonably be expected to improve the patient's condition.    Sharma Covert, MD 10/22/201912:24 PM

## 2018-04-11 NOTE — Progress Notes (Signed)
Pt is a 17 year old female admitted voluntarily to [facility] [place] for SI.  She reports she is here because "I don't really want to be alone."  She reports "having intense thoughts of jumping off my balcony."  Pt reports depression and SI for approximately 2 months that is "getting worse."  She endorses passive SI during admission assessment.  Pt verbally contracts for safety at [facility].  Denies HI, denies hallucinations, denies pain.  She is tearful at times during admission assessment.  Her voice is soft and she presents with anxious, depressed affect and mood.  She denies having support system.  She reports she doesn't know what triggers her SI.  Pt reports she lives alone and will return to same living situation.  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  She also reports she is "on leave" from her job at [employer].    Introduced self to pt.  Actively listened to pt and provided support and encouragement.  Admission process and paperwork completed with pt.  Non-invasive body assessment completed by female Charity fundraiser.  Pt has superficial scratches to abdomen and legs.  Belongings searched for contraband and items not allowed on unit are in locker.  Pt's birth control pills are in her medication drawer.  Fall prevention techniques reviewed with pt and she verbalized understanding.  Pt provided with PO fluids.  PRN medication for anxiety administered.  Pt oriented to unit and room.  Urine specimen obtained.  Pt is on Q15 minute safety checks.  Pt is cooperative with admission process.  She verbally contracts for safety and reports she will inform staff of needs and concerns.  She is safe on the unit.  Will continue to monitor and assess.

## 2018-04-11 NOTE — Plan of Care (Signed)
Nurse discussed anxiety, depression, coping skills with patient. 

## 2018-04-11 NOTE — [facility] Suicide Risk Assessment (Signed)
[facility] INPATIENT:  Family/Significant Other Suicide Prevention Education  Suicide Prevention Education:  Patient Refusal for Family/Significant Other Suicide Prevention Education: The patient Julie [name] has refused to provide written consent for family/significant other to be provided Family/Significant Other Suicide Prevention Education during admission and/or prior to discharge.  Physician notified.  SPE completed with patient, as patient refused to consent to family contact. Patient was encouraged to share information with support network, ask questions, and talk about any concerns relating to SPE. Patient denies access to guns/firearms and verbalized understanding of information provided. Mobile Crisis information also provided to patient.    [name] 04/11/2018, 10:54 AM

## 2018-04-11 NOTE — [facility] Suicide Risk Assessment (Signed)
[facility] Admission Suicide Risk Assessment   Nursing information obtained from:  Patient Demographic factors:  Living alone, Adolescent or young adult Current Mental Status:  Suicidal ideation indicated by patient Loss Factors:  NA Historical Factors:  Family history of mental illness or substance abuse Risk Reduction Factors:  Employed  Total Time spent with patient: 20 minutes Principal Problem: <principal problem not specified> Diagnosis:   Patient Active Problem List   Diagnosis Date Noted  . MDD (major depressive disorder), recurrent episode, severe (HCC) [F33.2] 04/11/2018   Subjective Data: Patient is seen and examined.  Patient is a 17 year old female with essentially negative past psychiatric history who presented to the behavioral health hospital as a walk-in.  Patient stated that she had been increasingly more depressed over the last several weeks, and began to think about killing herself.  She stated she was thinking about jumping off the back daily at her apartment.  She stated that it started approximately 2 weeks ago.  She is been having difficulty with nausea and vomiting.  They have been unable to come up with a cause for this.  She stated because she had been ill she had to drop out of college, and that bother her a great deal.  She admitted to helplessness, hopelessness and worthlessness.  She she admitted to crying spells.  She was admitted to the hospital for evaluation and stabilization.  Continued Clinical Symptoms:  Alcohol Use Disorder Identification Test Final Score (AUDIT): 0 The "Alcohol Use Disorders Identification Test", Guidelines for Use in Primary Care, Second Edition.  World Science writer [facility]). Score between 0-7:  no or low risk or alcohol related problems. Score between 8-15:  moderate risk of alcohol related problems. Score between 16-19:  high risk of alcohol related problems. Score 20 or above:  warrants further diagnostic evaluation for alcohol  dependence and treatment.   CLINICAL FACTORS:   Depression:   Anhedonia Hopelessness Impulsivity Insomnia   Musculoskeletal: Strength & Muscle Tone: within normal limits Gait & Station: normal Patient leans: N/A  Psychiatric Specialty Exam: Physical Exam  Nursing note and vitals reviewed. Constitutional: She is oriented to person, place, and time. She appears well-developed and well-nourished.  HENT:  Head: Normocephalic and atraumatic.  Respiratory: Effort normal.  Neurological: She is alert and oriented to person, place, and time.    ROS  Blood pressure 130/67, pulse 85, temperature 98.7 F (37.1 C), temperature source Oral, resp. rate 16, height 5\' 6"  (1.676 m), weight 103.4 kg.Body mass index is 36.8 kg/m.  General Appearance: Casual  Eye Contact:  Fair  Speech:  Slow  Volume:  Decreased  Mood:  Depressed  Affect:  Congruent  Thought Process:  Coherent and Descriptions of Associations: Intact  Orientation:  Full (Time, Place, and Person)  Thought Content:  Logical  Suicidal Thoughts:  Yes.  without intent/plan  Homicidal Thoughts:  No  Memory:  Immediate;   Fair Recent;   Fair Remote;   Fair  Judgement:  Intact  Insight:  Fair  Psychomotor Activity:  Decreased  Concentration:  Concentration: Fair and Attention Span: Fair  Recall:  [employer] of Knowledge:  Good  Language:  Fair  Akathisia:  Negative  Handed:  Right  AIMS (if indicated):     Assets:  Communication Skills Desire for Improvement Financial Resources/Insurance Housing Leisure Time Resilience Social Support Talents/Skills  ADL's:  Intact  Cognition:  WNL  Sleep:  Number of Hours: 2.5      COGNITIVE FEATURES THAT CONTRIBUTE TO  RISK:  None    SUICIDE RISK:   Mild:  Suicidal ideation of limited frequency, intensity, duration, and specificity.  There are no identifiable plans, no associated intent, mild dysphoria and related symptoms, good self-control (both objective and subjective  assessment), few other risk factors, and identifiable protective factors, including available and accessible social support.  PLAN OF CARE: Patient is seen and examined.  Patient is a 17 year old female with essentially negative past psychiatric history with the first episode of major depression.  She will be admitted to the hospital.  She will be integrated into the milieu.  She will be encouraged to attend groups.  She will be started on Lexapro 5 mg p.o. daily.  She will also have available hydroxyzine 25 mg p.o. every 6 hours as needed anxiety.  She will be given Zofran 4 mg p.o. every 6 hours as needed nausea.  I certify that inpatient services furnished can reasonably be expected to improve the patient's condition.   Antonieta Pert, MD 04/11/2018, 7:41 AM

## 2018-04-11 NOTE — [facility] Counselor (Signed)
Adult Comprehensive Assessment  Patient ID: Julie [name], female   DOB: 03-12-2001, 17 y.o.   MRN: [id number]  Information Source: Information source: Patient  Current Stressors:  Patient states their primary concerns and needs for treatment are:: "I wanted to jump off my balcony last night and nothing is wrong, I dont feel like I should be here anymore" Patient states their goals for this hospitilization and ongoing recovery are:: "Finding new ways to cope with what is making me sad" Educational / Learning stressors: Patient reports she recently dropped out of school. Patient reports she missed hmajority of her classes due to a physical illness Employment / Job issues: Employed; Patient reports she is currently on leave.  Family Relationships: Patient reports having a strained relationship with her parents due to family conflicts; Patient reports her father has behavioral episodes and her mother is "Psychiatric nurse / Lack of resources (include bankruptcy): Patient denies any stressors  Housing / Lack of housing: Lives in alone Physical health (include injuries & life threatening diseases): Patient reports she has been sick for 5 weeks due to a possible flu, however the patient reports consistent naseua and dizziness. Social relationships: Patient reports she does not feel like she has any social supports  Substance abuse: Patient denies any substance abuse issues  Bereavement / Loss: Patient reports her grandmother passed away in 07/23/2022  Living/Environment/Situation:  Living Arrangements: Alone Living conditions (as described by patient or guardian): "Good" Who else lives in the home?: Alone How long has patient lived in current situation?: Sicne August; 3 months  What is atmosphere in current home: Comfortable  Family History:  Marital status: Single Are you sexually active?: No What is your sexual orientation?: Heterosexual  Has your sexual activity been affected by drugs,  alcohol, medication, or emotional stress?: No  Does patient have children?: No  Childhood History:  By whom was/is the patient raised?: Both parents Description of patient's relationship with caregiver when they were a child: Patient reports having a "normal" relationship with her parents as a child.  Patient's description of current relationship with people who raised him/her: Patient reports she currently has a strained relationship with her parents currently.  How were you disciplined when you got in trouble as a child/adolescent?: Whoopings Does patient have siblings?: Yes Number of Siblings: 2 Description of patient's current relationship with siblings: Patient reports having a distant relationship with her older brother, however she and her younger brother are relatively close.  Did patient suffer any verbal/emotional/physical/sexual abuse as a child?: No Did patient suffer from severe childhood neglect?: No Has patient ever been sexually abused/assaulted/raped as an adolescent or adult?: No Was the patient ever a victim of a crime or a disaster?: No Witnessed domestic violence?: No Has patient been effected by domestic violence as an adult?: No  Education:  Highest grade of school patient has completed: Associate's degree Currently a student?: No Learning disability?: No  Employment/Work Situation:   Employment situation: Employed Where is patient currently employed?: [employer]  How long has patient been employed?: Since June Patient's job has been impacted by current illness: Yes Describe how patient's job has been impacted: Patient reports her physical illness has kept her from being able to be productive at work  What is the longest time patient has a held a job?: 6 months  Where was the patient employed at that time?: [employer]  Did You Receive Any Psychiatric Treatment/Services While in [employer]?: No Are There Guns or Other Weapons in  Your Home?: No  Financial Resources:    Financial resources: Income from employment, Private insurance Does patient have a representative payee or guardian?: No  Alcohol/Substance Abuse:   What has been your use of drugs/alcohol within the last 12 months?: Patient denies  If attempted suicide, did drugs/alcohol play a role in this?: No Alcohol/Substance Abuse Treatment Hx: Denies past history Has alcohol/substance abuse ever caused legal problems?: No  Social Support System:   Forensic psychologist System: None Describe Community Support System: None  Type of faith/religion: None  How does patient's faith help to cope with current illness?: N/A   Leisure/Recreation:   Leisure and Hobbies: Kniting  Strengths/Needs:   What is the patient's perception of their strengths?: " I am nice and I go out of my way for people" Patient states they can use these personal strengths during their treatment to contribute to their recovery: Yes  Patient states these barriers may affect/interfere with their treatment: No  Patient states these barriers may affect their return to the community: No  Other important information patient would like considered in planning for their treatment: No   Discharge Plan:   Currently receiving community mental health services: No Patient states concerns and preferences for aftercare planning are: Outpatient medication management and therapy services  Patient states they will know when they are safe and ready for discharge when: Decrease in depressive symtpoms; No longer suicidal  Does patient have access to transportation?: Yes Does patient have financial barriers related to discharge medications?: No Will patient be returning to same living situation after discharge?: Yes  Summary/Recommendations:   Summary and Recommendations (to be completed by the evaluator): Valerie is a 17 year old female who is diagnosed with MDD single episode, severe. She presented to the hospital seeking treatment for  increasing depression with suicidal thoughts. During the assessment, Cleotilde was pleasant and cooperative with providing information. Ayodele reports that she experienced an increase in depressive symptoms and suicidal ideation. Shamicka reports she has struggled with physicall illness for the last five weeks. Latrisha states that she does not know what is wrong with her and that she does not seem to get better. Gladine reports she would like to be referred to an outpatient provider for medication management and therapy services. Viktorya can benefit from crisis stabilization, medication mangement, therapeutic milieu and referral services.   [name]. 04/11/2018

## 2018-04-11 NOTE — Tx Team (Signed)
Initial Treatment Plan 04/11/2018 3:06 AM Arlyss Repress Heward UEA:[id number]    PATIENT STRESSORS: Other: recently dropped out of school, lack of support   PATIENT STRENGTHS: Average or above average intelligence Capable of independent living Communication skills General fund of knowledge Physical Health   PATIENT IDENTIFIED PROBLEMS: SI  depression    "trying to combat these thoughts"  "eating a bit more"              DISCHARGE CRITERIA:  Improved stabilization in mood, thinking, and/or behavior Motivation to continue treatment in a less acute level of care Reduction of life-threatening or endangering symptoms to within safe limits  PRELIMINARY DISCHARGE PLAN: Outpatient therapy Return to previous living arrangement  PATIENT/FAMILY INVOLVEMENT: This treatment plan has been presented to and reviewed with the patient, Julie [name].  The patient and family have been given the opportunity to ask questions and make suggestions.  Arrie Aran, [place] 04/11/2018, 3:06 AM

## 2018-04-11 NOTE — Progress Notes (Addendum)
D:  Patient's self inventory sheet, patient has fair sleep, no sleep medication.  Fair appetite, normal energy level, good concentration.  Rated depression 5, hopeless 3, anxiety 6.  Denied withdrawals, then checked nausea.  SI, sometimes, contracts for safety.  Physical problems, lightheaded.  Physical pain.  Goal is currently trying to participate.  Plans to stay out of soft bed.  No discharge plans. A:  Medications administered per MD orders.  Emotional support and encouragement given patient. R:  Denied HI, contracts for safety.  Denied A/V hallucinations.  Safety maintained with 15 minute checks. Patient stated she does have SI thoughts off/on, no plan, contracts for safety.   Stated she does have SI thoughts "generally, not so bad now, no plan, contracts for safety."

## 2018-04-11 NOTE — [facility] Group Notes (Signed)
LCSW Group Therapy Note 04/11/2018 12:11 PM  Type of Therapy/Topic: Group Therapy: Feelings about Diagnosis  Participation Level: Did Not Attend   Description of Group:  This group will allow patients to explore their thoughts and feelings about diagnoses they have received. Patients will be guided to explore their level of understanding and acceptance of these diagnoses. Facilitator will encourage patients to process their thoughts and feelings about the reactions of others to their diagnosis and will guide patients in identifying ways to discuss their diagnosis with significant others in their lives. This group will be process-oriented, with patients participating in exploration of their own experiences, giving and receiving support, and processing challenge from other group members.  Therapeutic Goals: 1. Patient will demonstrate understanding of diagnosis as evidenced by identifying two or more symptoms of the disorder 2. Patient will be able to express two feelings regarding the diagnosis 3. Patient will demonstrate their ability to communicate their needs through discussion and/or role play  Summary of Patient Progress:  Invited, chose not to attend.    Therapeutic Modalities:  Cognitive Behavioral Therapy Brief Therapy Feelings Identification    Rayvion Stumph Catalina Antigua Clinical Social Worker

## 2018-04-11 NOTE — BH Assessment (Signed)
Assessment Note  Julie [name] is an 17 y.o. female.  -Patient was a walk-in who came to [facility] by herself.  Patient says she has been having increasing depression with suicidal thoughts.  She has been depressed for over 2 months.  Over the last week she has been increasingly depressed and thinking about killing herself.  Patient said that she did not have a plan at this time.  She has had a previous suicidal gesture.    Patient has no HI or A/V hallucinations.  She denies any use of ETOH or illicit drugs.  Patient says she was sick for a few weeks before the last 2 weeks.  She had to drop out from [facility] due to medical reasons.  She says that she was depressed before her health problems.  She cannot identify the specific stressor that is making her feel this way.  Patient cried during assessment.  Patient denies any outpatient or inpatient care.  -Clinician discussed patient care with Donell Sievert, PA who recommended inpatient care.  Patient signed voluntary admission papers and has been admitted to [facility] 401-2 to Dr. Jama Flavors.    Diagnosis: F32.2 MDD single episode, severe  Past Medical History:  Past Medical History:  Diagnosis Date  . Dysmenorrhea   . Painful menstrual periods     Past Surgical History:  Procedure Laterality Date  . TONSILLECTOMY      Family History:  Family History  Problem Relation Age of Onset  . Hypertension Mother   . Chronic Renal Failure Father     Social History:  reports that she has never smoked. She has never used smokeless tobacco. She reports that she does not drink alcohol or use drugs.  Additional Social History:  Alcohol / Drug Use Pain Medications: None Prescriptions: Birth control Over the Counter: Ibuprophen, nausea medicine History of alcohol / drug use?: No history of alcohol / drug abuse  CIWA:   COWS:    Allergies: No Known Allergies  Home Medications:  Medications Prior to Admission  Medication Sig Dispense Refill  .  acetaminophen (TYLENOL) 500 MG tablet Take 1,000 mg by mouth daily as needed for mild pain or fever.    Marland Kitchen amoxicillin-clavulanate (AUGMENTIN) 875-125 MG tablet Take 1 tablet by mouth 2 (two) times daily.  0  . cetirizine (ZYRTEC) 10 MG tablet Take 10 mg by mouth daily.  5  . ibuprofen (ADVIL,MOTRIN) 200 MG tablet Take 800 mg by mouth daily as needed for fever or moderate pain.    Marland Kitchen ibuprofen (ADVIL,MOTRIN) 800 MG tablet Take 1 tablet (800 mg total) by mouth every 8 (eight) hours as needed for mild pain. (Patient not taking: Reported on 03/14/2018) 30 tablet 0  . lidocaine (XYLOCAINE) 2 % solution Use as directed 15 mLs in the mouth or throat as needed for mouth pain. 100 mL 0  . menthol-cetylpyridinium (CEPACOL) 3 MG lozenge Take 1 lozenge by mouth as needed for sore throat.    . ondansetron (ZOFRAN ODT) 4 MG disintegrating tablet Take 1 tablet (4 mg total) by mouth every 8 (eight) hours as needed for nausea or vomiting. (Patient not taking: Reported on 03/14/2018) 20 tablet 0  . ondansetron (ZOFRAN ODT) 4 MG disintegrating tablet 4mg  ODT q4 hours prn nausea/vomit 8 tablet 0  . Pseudoephedrine-APAP-DM (TYLENOL FLU PO) Take 2 tablets by mouth daily.    . traMADol (ULTRAM) 50 MG tablet Take 1 tablet (50 mg total) by mouth every 6 (six) hours as needed. (Patient not taking: Reported  on 03/14/2018) 15 tablet 0    OB/GYN Status:  No LMP recorded.  General Assessment Data Location of Assessment: [facility] Assessment Services TTS Assessment: In system Is this a Tele or Face-to-Face Assessment?: Face-to-Face Is this an Initial Assessment or a Re-assessment for this encounter?: Initial Assessment Patient Accompanied by:: N/A Language Other than English: No Living Arrangements: Other (Comment)(Lives alone in an apartment) What gender do you identify as?: Female Marital status: Single Pregnancy Status: No Living Arrangements: Alone Can pt return to current living arrangement?: Yes Admission Status:  Voluntary Is patient capable of signing voluntary admission?: Yes Referral Source: Self/Family/Friend Insurance type: [place] [employer]  Medical Screening Exam [facility] Walk-in ONLY) Medical Exam completed: Teacher, early years/pre, PA)  Crisis Care Plan Living Arrangements: Alone Name of Psychiatrist: None Name of Therapist: None  Education Status Is patient currently in school?: No Is the patient employed, unemployed or receiving disability?: Employed  Risk to self with the past 6 months Suicidal Ideation: Yes-Currently Present Has patient been a risk to self within the past 6 months prior to admission? : No Suicidal Intent: Yes-Currently Present Has patient had any suicidal intent within the past 6 months prior to admission? : No Is patient at risk for suicide?: Yes Suicidal Plan?: No Has patient had any suicidal plan within the past 6 months prior to admission? : No Access to Means: No What has been your use of drugs/alcohol within the last 12 months?: Pt denies Previous Attempts/Gestures: Yes How many times?: 1 Other Self Harm Risks: None Triggers for Past Attempts: Unknown Intentional Self Injurious Behavior: None Family Suicide History: No Recent stressful life event(s): Recent negative physical changes(Poor health a few weeks ago.) Persecutory voices/beliefs?: No Depression: Yes Depression Symptoms: Despondent, Insomnia, Tearfulness, Isolating, Loss of interest in usual pleasures, Feeling worthless/self pity Substance abuse history and/or treatment for substance abuse?: No Suicide prevention information given to non-admitted patients: Not applicable  Risk to Others within the past 6 months Homicidal Ideation: No Does patient have any lifetime risk of violence toward others beyond the six months prior to admission? : No Thoughts of Harm to Others: No Current Homicidal Intent: No Current Homicidal Plan: No Access to Homicidal Means: No Identified Victim: No one History of  harm to others?: No Assessment of Violence: None Noted Violent Behavior Description: None reported Does patient have access to weapons?: No Criminal Charges Pending?: No Does patient have a court date: No Is patient on probation?: No  Psychosis Hallucinations: None noted Delusions: None noted  Mental Status Report Appearance/Hygiene: Unremarkable Eye Contact: Good Motor Activity: Freedom of movement, Unremarkable Speech: Logical/coherent Level of Consciousness: Alert, Crying Mood: Depressed, Anxious, Sad Affect: Appropriate to circumstance Anxiety Level: Moderate Thought Processes: Coherent, Relevant Judgement: Impaired Orientation: Appropriate for developmental age Obsessive Compulsive Thoughts/Behaviors: None  Cognitive Functioning Concentration: Decreased Memory: Remote Intact, Recent Intact Is patient IDD: No Insight: Fair Impulse Control: Fair Appetite: Poor Have you had any weight changes? : Loss Amount of the weight change? (lbs): (Unsure) Sleep: Decreased Total Hours of Sleep: (Pt says she is up and down a lot.) Vegetative Symptoms: Staying in bed, Decreased grooming  ADLScreening [facility] Assessment Services) Patient's cognitive ability adequate to safely complete daily activities?: Yes Patient able to express need for assistance with ADLs?: Yes Independently performs ADLs?: Yes (appropriate for developmental age)  Prior Inpatient Therapy Prior Inpatient Therapy: No  Prior Outpatient Therapy Prior Outpatient Therapy: No Does patient have an ACCT team?: No Does patient have Intensive In-House Services?  : No  Does patient have [facility] services? : No Does patient have P4CC services?: No  ADL Screening (condition at time of admission) Patient's cognitive ability adequate to safely complete daily activities?: Yes Is the patient deaf or have difficulty hearing?: No Does the patient have difficulty seeing, even when wearing glasses/contacts?: No(Wears  glasses) Does the patient have difficulty concentrating, remembering, or making decisions?: Yes Patient able to express need for assistance with ADLs?: Yes Does the patient have difficulty dressing or bathing?: No Independently performs ADLs?: Yes (appropriate for developmental age) Does the patient have difficulty walking or climbing stairs?: No Weakness of Legs: None Weakness of Arms/Hands: None       Abuse/Neglect Assessment (Assessment to be complete while patient is alone) Abuse/Neglect Assessment Can Be Completed: Yes Physical Abuse: Denies Verbal Abuse: Denies Sexual Abuse: Denies Exploitation of patient/patient's resources: Denies Self-Neglect: Denies     Merchant navy officer (For Healthcare) Does Patient Have a Medical Advance Directive?: No Would patient like information on creating a medical advance directive?: No - Patient declined          Disposition:  Disposition Initial Assessment Completed for this Encounter: Yes Disposition of Patient: Admit Type of inpatient treatment program: Adult Patient refused recommended treatment: No Mode of transportation if patient is discharged?: N/A Patient referred to: Other (Comment)(Pt has been admitted [facility] 401-2)  On Site Evaluation by:   Reviewed with Physician:    Beatriz Stallion Ray 04/11/2018 2:15 AM

## 2018-04-11 NOTE — H&P (Signed)
Behavioral Health Medical Screening Exam  Julie [name] is an 17 y.o. female reports to [facility] endorsing worsening MDD with SI, without plan. She cannot presently contract for safety. She denies use of illicit drugs, acute ailments and or comorbid medical conditions  Total Time spent with patient: 20 minutes  Psychiatric Specialty Exam: Physical Exam  Constitutional: She is oriented to person, place, and time. She appears well-developed and well-nourished. No distress.  HENT:  Head: Normocephalic.  Eyes: Pupils are equal, round, and reactive to light.  Respiratory: Effort normal and breath sounds normal. No respiratory distress.  Neurological: She is alert and oriented to person, place, and time. No cranial nerve deficit.  Skin: Skin is warm and dry. She is not diaphoretic.  Psychiatric: Her speech is normal. Judgment normal. She is withdrawn. Cognition and memory are normal. She exhibits a depressed mood. She expresses suicidal ideation. She expresses no suicidal plans and no homicidal plans.    Review of Systems  Constitutional: Negative for chills, diaphoresis, fever, malaise/fatigue and weight loss.  Psychiatric/Behavioral: Positive for depression. Negative for substance abuse and suicidal ideas. The patient is not nervous/anxious.   All other systems reviewed and are negative.   There were no vitals taken for this visit.There is no height or weight on file to calculate BMI.  General Appearance: Casual  Eye Contact:  Good  Speech:  Clear and Coherent  Volume:  Normal  Mood:  Depressed  Affect:  Congruent  Thought Process:  Goal Directed  Orientation:  Full (Time, Place, and Person)  Thought Content:  Logical  Suicidal Thoughts:  Yes.  without intent/plan  Homicidal Thoughts:  No  Memory:  Immediate;   Fair  Judgement:  Good  Insight:  Good  Psychomotor Activity:  Normal  Concentration: Concentration: Fair  Recall:  Good  Fund of Knowledge:Fair  Language: Good   Akathisia:  Negative  Handed:  Right  AIMS (if indicated):     Assets:  Desire for Improvement  Sleep:       Musculoskeletal: Strength & Muscle Tone: within normal limits Gait & Station: normal Patient leans: N/A  There were no vitals taken for this visit.  Recommendations:  Based on my evaluation the patient does not appear to have an emergency medical condition.  Kerry Hough, PA-C 04/11/2018, 1:40 AM

## 2018-04-12 DIAGNOSIS — G4700 Insomnia, unspecified: Secondary | ICD-10-CM

## 2018-04-12 LAB — PROLACTIN: Prolactin: 38.2 ng/mL — ABNORMAL HIGH (ref 4.8–23.3)

## 2018-04-12 MED ORDER — TRAZODONE HCL 50 MG PO TABS
25.0000 mg | ORAL_TABLET | Freq: Every evening | ORAL | Status: DC | PRN
  Administered 2018-04-12: 25 mg via ORAL
  Filled 2018-04-12 (×6): qty 0.5

## 2018-04-12 NOTE — BHH Group Notes (Signed)
[facility] Mental [employer] Group Therapy      04/12/2018 2:09 PM  Type of Therapy: Mental Health Association Presentation  Participation Level: Active  Participation Quality: Attentive  Affect: Appropriate  Cognitive: Oriented  Insight: Developing/Improving  Engagement in Therapy: Engaged  Modes of Intervention: Discussion, Education and Socialization  Summary of Progress/Problems: Mental Health Association (MHA) Speaker came to talk about his personal journey with mental health. The pt processed ways by which to relate to the speaker. MHA speaker provided handouts and educational information pertaining to groups and services offered by the [facility]. Pt was engaged in speaker's presentation and was receptive to resources provided.    Alcario Drought Clinical Social Worker

## 2018-04-12 NOTE — Progress Notes (Signed)
D: Patient has been quiet today.  She has been visible in the milieu and is interacting well with her peers.  Patient's goal today is to "try to be more present."  She rates her depression and hopelessness as a 3; anxiety as a 2.  She continues to present with flat, blunted affect; she is soft-spoken. She denies any thoughts of self harm. Patient is a possible discharge in 1-2 days.  A: Continue to monitor medication management and MD orders.  Safety checks completed every 15 minutes per protocol.  Offer support and encouragement as needed.  R: Patient is receptive to staff; her behavior is appropriate.

## 2018-04-12 NOTE — Progress Notes (Signed)
[facility] MD Progress Note  04/12/2018 11:00 AM Julie [name]  MRN:  [id number] Subjective:  Patient is seen and examined. Patient is a 17 year old female with essentially negative past psychiatric history who presented to the behavioral health hospital as a walk-in. Patient stated that she had been increasingly more depressed over the last several weeks, and began to think about killing herself. She stated she was thinking about jumping off the back daily at her apartment. She stated that it started approximately 2 weeks ago. She is been having difficulty with nausea and vomiting. They have been unable to come up with a cause for this. She stated because she had been ill she had to drop out of college, and that bother her a great deal. She admitted to helplessness, hopelessness and worthlessness. She she admitted to crying spells. She was admitted to the hospital for evaluation and stabilization.  Objective: Patient is seen and examined.  Patient is a 17 year old female with a past psychiatric history significant for major depression and suicidal ideation.  She is seen in follow-up.  She states she feels little bit better today.  She stated her head does not feel as full.  She is a bit oversedated this morning.  We discussed the potential Benadryl like hangover from trazodone.  We discussed possibly decreasing that dose.  She stated she has not felt nauseated yet this morning.  She stated she is planning on going home after discharge.  Denied any current suicidal ideation.  Principal Problem: <principal problem not specified> Diagnosis:   Patient Active Problem List   Diagnosis Date Noted  . MDD (major depressive disorder), recurrent episode, severe ([place]) [F33.2] 04/11/2018   Total Time spent with patient: 15 minutes  Past Psychiatric History: See admission H&P  Past Medical History:  Past Medical History:  Diagnosis Date  . Dysmenorrhea   . Painful menstrual periods     Past Surgical  History:  Procedure Laterality Date  . TONSILLECTOMY     Family History:  Family History  Problem Relation Age of Onset  . Hypertension Mother   . Chronic Renal Failure Father    Family Psychiatric  History: See admission H&P Social History:  Social History   Substance and Sexual Activity  Alcohol Use Never  . Frequency: Never     Social History   Substance and Sexual Activity  Drug Use Never    Social History   Socioeconomic History  . Marital status: Single    Spouse name: Not on file  . Number of children: Not on file  . Years of education: Not on file  . Highest education level: Not on file  Occupational History  . Not on file  Social Needs  . Financial resource strain: Not on file  . Food insecurity:    Worry: Not on file    Inability: Not on file  . Transportation needs:    Medical: Not on file    Non-medical: Not on file  Tobacco Use  . Smoking status: Never Smoker  . Smokeless tobacco: Never Used  Substance and Sexual Activity  . Alcohol use: Never    Frequency: Never  . Drug use: Never  . Sexual activity: Not Currently  Lifestyle  . Physical activity:    Days per week: Not on file    Minutes per session: Not on file  . Stress: Not on file  Relationships  . Social connections:    Talks on phone: Not on file    Gets together:  Not on file    Attends religious service: Not on file    Active member of club or organization: Not on file    Attends meetings of clubs or organizations: Not on file    Relationship status: Not on file  Other Topics Concern  . Not on file  Social History Narrative   ** Merged History Encounter **       Additional Social History:    Pain Medications: denies Prescriptions: denies Over the Counter: denies History of alcohol / drug use?: No history of alcohol / drug abuse Longest period of sobriety (when/how long): N/A                    Sleep: Fair  Appetite:  Fair  Current Medications: Current  Facility-Administered Medications  Medication Dose Route Frequency Provider Last Rate Last Dose  . acetaminophen (TYLENOL) tablet 650 mg  650 mg Oral Q6H PRN Laverle Hobby, PA-C      . alum & mag hydroxide-simeth (MAALOX/MYLANTA) 200-200-20 MG/5ML suspension 30 mL  30 mL Oral Q4H PRN Patriciaann Clan E, PA-C      . escitalopram (LEXAPRO) tablet 5 mg  5 mg Oral Daily Sharma Covert, MD   5 mg at 04/12/18 2263  . hydrOXYzine (ATARAX/VISTARIL) tablet 25 mg  25 mg Oral Q6H PRN Laverle Hobby, PA-C   25 mg at 04/12/18 3354  . Influenza vac split quadrivalent PF (FLUARIX) injection 0.5 mL  0.5 mL Intramuscular Tomorrow-1000 Cobos, Fernando A, MD      . magnesium hydroxide (MILK OF MAGNESIA) suspension 30 mL  30 mL Oral Daily PRN Laverle Hobby, PA-C      . Norgestimate-Ethinyl Estradiol Triphasic 0.18/0.215/0.25 MG-35 MCG tablet 1 tablet  1 tablet Oral QHS Hampton Abbot, MD   1 tablet at 04/11/18 2231  . ondansetron (ZOFRAN) tablet 4 mg  4 mg Oral Q8H PRN Sharma Covert, MD   4 mg at 04/11/18 1122  . traZODone (DESYREL) tablet 25 mg  25 mg Oral QHS,MR X 1 Sharma Covert, MD        Lab Results:  Results for orders placed or performed during the hospital encounter of 04/11/18 (from the past 48 hour(s))  CBC     Status: Abnormal   Collection Time: 04/11/18  6:28 AM  Result Value Ref Range   WBC 7.9 4.0 - 10.5 K/uL   RBC 3.90 3.87 - 5.11 MIL/uL   Hemoglobin 9.5 (L) 12.0 - 15.0 g/dL   HCT 31.8 (L) 36.0 - 46.0 %   MCV 81.5 80.0 - 100.0 fL   MCH 24.4 (L) 26.0 - 34.0 pg   MCHC 29.9 (L) 30.0 - 36.0 g/dL   RDW 14.7 11.5 - 15.5 %   Platelets 316 150 - 400 K/uL   nRBC 0.0 0.0 - 0.2 %    Comment: Performed at [facility], [place] [address] [place], [place] [address]  Comprehensive metabolic panel     Status: Abnormal   Collection Time: 04/11/18  6:28 AM  Result Value Ref Range   Sodium 138 135 - 145 mmol/L   Potassium 3.8 3.5 - 5.1 mmol/L   Chloride 110 98 - 111  mmol/L   CO2 23 22 - 32 mmol/L   Glucose, Bld 103 (H) 70 - 99 mg/dL   BUN 7 6 - 20 mg/dL   Creatinine, Ser 0.67 0.44 - 1.00 mg/dL   Calcium 8.5 (L) 8.9 - 10.3 mg/dL  Total Protein 7.1 6.5 - 8.1 g/dL   Albumin 3.1 (L) 3.5 - 5.0 g/dL   AST 11 (L) 15 - 41 U/L   ALT 14 0 - 44 U/L   Alkaline Phosphatase 43 38 - 126 U/L   Total Bilirubin 0.1 (L) 0.3 - 1.2 mg/dL   GFR calc non Af Amer >60 >60 mL/min   GFR calc Af Amer >60 >60 mL/min    Comment: (NOTE) The eGFR has been calculated using the CKD EPI equation. This calculation has not been validated in all clinical situations. eGFR's persistently <60 mL/min signify possible Chronic Kidney Disease.    Anion gap 5 5 - 15    Comment: Performed at [facility], [place] [address], [place], [place] [address]  Lipid panel     Status: Abnormal   Collection Time: 04/11/18  6:28 AM  Result Value Ref Range   Cholesterol 177 0 - 200 mg/dL   Triglycerides 107 <150 mg/dL   HDL 40 (L) >40 mg/dL   Total CHOL/HDL Ratio 4.4 RATIO   VLDL 21 0 - 40 mg/dL   LDL Cholesterol 116 (H) 0 - 99 mg/dL    Comment:        Total Cholesterol/HDL:CHD Risk Coronary Heart Disease Risk Table                     Men   Women  1/2 Average Risk   3.4   3.3  Average Risk       5.0   4.4  2 X Average Risk   9.6   7.1  3 X Average Risk  23.4   11.0        Use the calculated Patient Ratio above and the CHD Risk Table to determine the patient's CHD Risk.        ATP III CLASSIFICATION (LDL):  <100     mg/dL   Optimal  100-129  mg/dL   Near or Above                    Optimal  130-159  mg/dL   Borderline  160-189  mg/dL   High  >190     mg/dL   Very High Performed at [place] [address] [place], [place] [address]   TSH     Status: None   Collection Time: 04/11/18  6:28 AM  Result Value Ref Range   TSH 2.682 0.350 - 4.500 uIU/mL    Comment: Performed by a 3rd Generation assay with a functional sensitivity of <=0.01  uIU/mL. Performed at [facility], [place] [address], [place], [place] [address]   Prolactin     Status: Abnormal   Collection Time: 04/11/18  6:28 AM  Result Value Ref Range   Prolactin 38.2 (H) 4.8 - 23.3 ng/mL    Comment: (NOTE) Performed At: [facility] [place], [place] [id number] Rush Farmer MD IO:[id number]   Urinalysis, Routine w reflex microscopic     Status: Abnormal   Collection Time: 04/11/18  2:55 PM  Result Value Ref Range   Color, Urine AMBER (A) YELLOW    Comment: BIOCHEMICALS MAY BE AFFECTED BY COLOR   APPearance CLOUDY (A) CLEAR   Specific Gravity, Urine 1.021 1.005 - 1.030   pH 6.0 5.0 - 8.0   Glucose, UA NEGATIVE NEGATIVE mg/dL   Hgb urine dipstick MODERATE (A) NEGATIVE   Bilirubin Urine NEGATIVE NEGATIVE   Ketones, ur  NEGATIVE NEGATIVE mg/dL   Protein, ur NEGATIVE NEGATIVE mg/dL   Nitrite NEGATIVE NEGATIVE   Leukocytes, UA TRACE (A) NEGATIVE   RBC / HPF 0-5 0 - 5 RBC/hpf   WBC, UA 0-5 0 - 5 WBC/hpf   Bacteria, UA MANY (A) NONE SEEN   Squamous Epithelial / LPF 6-10 0 - 5   Mucus PRESENT    Hyaline Casts, UA PRESENT     Comment: Performed at [facility], [place] [address] [place], [place] [address]  Pregnancy, urine     Status: None   Collection Time: 04/11/18  2:55 PM  Result Value Ref Range   Preg Test, Ur NEGATIVE NEGATIVE    Comment:        THE SENSITIVITY OF THIS METHODOLOGY IS >20 mIU/mL. Performed at [facility], [place] [address] [place], [place] [address]   Urine rapid drug screen (hosp performed)not at [facility]     Status: Abnormal   Collection Time: 04/11/18  2:55 PM  Result Value Ref Range   Opiates NONE DETECTED NONE DETECTED   Cocaine NONE DETECTED NONE DETECTED   Benzodiazepines NONE DETECTED NONE DETECTED   Amphetamines NONE DETECTED NONE DETECTED   Tetrahydrocannabinol POSITIVE (A) NONE DETECTED   Barbiturates NONE DETECTED NONE DETECTED    Comment:  (NOTE) DRUG SCREEN FOR MEDICAL PURPOSES ONLY.  IF CONFIRMATION IS NEEDED FOR ANY PURPOSE, NOTIFY LAB WITHIN 5 DAYS. LOWEST DETECTABLE LIMITS FOR URINE DRUG SCREEN Drug Class                     Cutoff (ng/mL) Amphetamine and metabolites    1000 Barbiturate and metabolites    200 Benzodiazepine                 121 Tricyclics and metabolites     300 Opiates and metabolites        300 Cocaine and metabolites        300 THC                            50 Performed at [facility], [place] [address], [place], [place] [address]     Blood Alcohol level:  No results found for: [facility]  Metabolic Disorder Labs: No results found for: HGBA1C, MPG Lab Results  Component Value Date   PROLACTIN 38.2 (H) 04/11/2018   Lab Results  Component Value Date   CHOL 177 04/11/2018   TRIG 107 04/11/2018   HDL 40 (L) 04/11/2018   CHOLHDL 4.4 04/11/2018   VLDL 21 04/11/2018   LDLCALC 116 (H) 04/11/2018    Physical Findings: AIMS: Facial and Oral Movements Muscles of Facial Expression: None, normal Lips and Perioral Area: None, normal Jaw: None, normal Tongue: None, normal,Extremity Movements Upper (arms, wrists, hands, fingers): None, normal Lower (legs, knees, ankles, toes): None, normal, Trunk Movements Neck, shoulders, hips: None, normal, Overall Severity Severity of abnormal movements (highest score from questions above): None, normal Incapacitation due to abnormal movements: None, normal Patient's awareness of abnormal movements (rate only patient's report): No Awareness, Dental Status Current problems with teeth and/or dentures?: No Does patient usually wear dentures?: No  CIWA:  CIWA-Ar Total: 1 COWS:  COWS Total Score: 2  Musculoskeletal: Strength & Muscle Tone: within normal limits Gait & Station: normal Patient leans: N/A  Psychiatric Specialty Exam: Physical Exam  Nursing note and vitals reviewed. Constitutional: She is oriented to person, place, and time.  She appears well-developed and well-nourished.  HENT:  Head: Normocephalic and atraumatic.  Respiratory: Effort normal.  Neurological: She is alert and oriented to person, place, and time.    ROS  Blood pressure 123/83, pulse 97, temperature 98 F (36.7 C), temperature source Oral, resp. rate 16, height 5' 6"  (1.676 m), weight 103.4 kg.Body mass index is 36.8 kg/m.  General Appearance: Casual  Eye Contact:  Fair  Speech:  Normal Rate  Volume:  Decreased  Mood:  Depressed  Affect:  Congruent  Thought Process:  Coherent and Descriptions of Associations: Intact  Orientation:  Full (Time, Place, and Person)  Thought Content:  Logical  Suicidal Thoughts:  No  Homicidal Thoughts:  No  Memory:  Immediate;   Fair Recent;   Fair Remote;   Fair  Judgement:  Intact  Insight:  Fair  Psychomotor Activity:  Normal  Concentration:  Concentration: Fair and Attention Span: Fair  Recall:  [employer] of Knowledge:  Fair  Language:  Fair  Akathisia:  Negative  Handed:  Right  AIMS (if indicated):     Assets:  Desire for Improvement Financial Resources/Insurance Housing Leisure Time Physical Health Resilience Social Support Talents/Skills  ADL's:  Intact  Cognition:  WNL  Sleep:  Number of Hours: 6.75     Treatment Plan Summary: Daily contact with patient to assess and evaluate symptoms and progress in treatment, Medication management and Plan : Patient is seen and examined.  Patient is a 17 year old female with a past psychiatric history significant for major depression.  #1 major depression-continue Lexapro 5 mg p.o. daily.  Continue hydroxyzine 25 mg p.o. every 6 hours as needed anxiety.  #2 insomnia-reduce trazodone to 25 mg p.o. nightly as needed insomnia.  #3 nausea-no nausea this morning, continue Zofran 4 mg p.o. every 8 hours as needed nausea.  #4 disposition planning-probable discharge in 1 to 2 days.  Sharma Covert, MD 04/12/2018, 11:00 AM

## 2018-04-12 NOTE — Progress Notes (Signed)
Pt is a new admit from early this morning on the previous night shift.  Pt reports she had a good day.  She says she is still having some passive suicidal thoughts, but contracts for safety with Clinical research associate.  She denies HI/AVH.  Pt says the doctor spoke with her about starting medications and started her on Lexapro.  She has been pleasant and cooperative with staff.  Pt makes her needs known to staff.  Support and encouragement offered.  Discharge plans are in process.  Safety maintained with q15 minute checks.

## 2018-04-12 NOTE — Progress Notes (Signed)
Recreation Therapy Notes  Date: 10.23.19 Time: 0930 Location: [address] Dayroom  Group Topic: Stress Management  Goal Area(s) Addresses:  Patient will verbalize importance of using healthy stress management.  Patient will identify positive emotions associated with healthy stress management.   Intervention: Stress Management  Activity :  Guided Imagery.  LRT introduced the stress management technique of guided imagery.  LRT read a script to allow patients to envision their peaceful place.  Patients were to follow along as script was read to engage in the activity.  Education:  Stress Management, Discharge Planning.   Education Outcome: Acknowledges edcuation/In group clarification offered/Needs additional education  Clinical Observations/Feedback: Pt did not attend group.      Caroll Rancher, LRT/CTRS         Lillia Abed, Segundo Makela A 04/12/2018 11:22 AM

## 2018-04-12 NOTE — Tx Team (Signed)
Interdisciplinary Treatment and Diagnostic Plan Update  04/12/2018 Time of Session: 9:35am Julie [name] MRN: [id number]  Principal Diagnosis: <principal problem not specified>  Secondary Diagnoses: Active Problems:   MDD (major depressive disorder), recurrent episode, severe ([facility])   Current Medications:  Current Facility-Administered Medications  Medication Dose Route Frequency Provider Last Rate Last Dose  . acetaminophen (TYLENOL) tablet 650 mg  650 mg Oral Q6H PRN Laverle Hobby, PA-C      . alum & mag hydroxide-simeth (MAALOX/MYLANTA) 200-200-20 MG/5ML suspension 30 mL  30 mL Oral Q4H PRN Patriciaann Clan E, PA-C      . escitalopram (LEXAPRO) tablet 5 mg  5 mg Oral Daily Sharma Covert, MD   5 mg at 04/12/18 7001  . hydrOXYzine (ATARAX/VISTARIL) tablet 25 mg  25 mg Oral Q6H PRN Laverle Hobby, PA-C   25 mg at 04/12/18 7494  . Influenza vac split quadrivalent PF (FLUARIX) injection 0.5 mL  0.5 mL Intramuscular Tomorrow-1000 Cobos, Fernando A, MD      . magnesium hydroxide (MILK OF MAGNESIA) suspension 30 mL  30 mL Oral Daily PRN Laverle Hobby, PA-C      . Norgestimate-Ethinyl Estradiol Triphasic 0.18/0.215/0.25 MG-35 MCG tablet 1 tablet  1 tablet Oral QHS Hampton Abbot, MD   1 tablet at 04/11/18 2231  . ondansetron (ZOFRAN) tablet 4 mg  4 mg Oral Q8H PRN Sharma Covert, MD   4 mg at 04/11/18 1122  . traZODone (DESYREL) tablet 25 mg  25 mg Oral QHS,MR X 1 Clary, Cordie Grice, MD       PTA Medications: Medications Prior to Admission  Medication Sig Dispense Refill Last Dose  . ibuprofen (ADVIL,MOTRIN) 800 MG tablet Take 1 tablet (800 mg total) by mouth every 8 (eight) hours as needed for mild pain. 30 tablet 0 Past Month at Unknown time  . Norgestim-Eth Estrad Triphasic (TRI-SPRINTEC PO) Take 1 tablet by mouth at bedtime.   04/10/2018 at Unknown time  . ondansetron (ZOFRAN) 8 MG tablet Take by mouth every 8 (eight) hours as needed for nausea or vomiting.   Past Month at  Unknown time    Patient Stressors: Other: recently dropped out of school, lack of support  Patient Strengths: Average or above average intelligence Capable of independent living Communication skills General fund of knowledge Physical Health  Treatment Modalities: Medication Management, Group therapy, Case management,  1 to 1 session with clinician, Psychoeducation, Recreational therapy.   Physician Treatment Plan for Primary Diagnosis: <principal problem not specified> Long Term Goal(s): Improvement in symptoms so as ready for discharge Improvement in symptoms so as ready for discharge   Short Term Goals: Ability to identify changes in lifestyle to reduce recurrence of condition will improve Ability to verbalize feelings will improve Ability to disclose and discuss suicidal ideas Ability to demonstrate self-control will improve Ability to identify and develop effective coping behaviors will improve Ability to maintain clinical measurements within normal limits will improve Ability to identify changes in lifestyle to reduce recurrence of condition will improve Ability to verbalize feelings will improve Ability to disclose and discuss suicidal ideas Ability to demonstrate self-control will improve Ability to identify and develop effective coping behaviors will improve Ability to maintain clinical measurements within normal limits will improve  Medication Management: Evaluate patient's response, side effects, and tolerance of medication regimen.  Therapeutic Interventions: 1 to 1 sessions, Unit Group sessions and Medication administration.  Evaluation of Outcomes: Not Met  Physician Treatment Plan for Secondary Diagnosis: Active Problems:  MDD (major depressive disorder), recurrent episode, severe ([place])  Long Term Goal(s): Improvement in symptoms so as ready for discharge Improvement in symptoms so as ready for discharge   Short Term Goals: Ability to identify changes in  lifestyle to reduce recurrence of condition will improve Ability to verbalize feelings will improve Ability to disclose and discuss suicidal ideas Ability to demonstrate self-control will improve Ability to identify and develop effective coping behaviors will improve Ability to maintain clinical measurements within normal limits will improve Ability to identify changes in lifestyle to reduce recurrence of condition will improve Ability to verbalize feelings will improve Ability to disclose and discuss suicidal ideas Ability to demonstrate self-control will improve Ability to identify and develop effective coping behaviors will improve Ability to maintain clinical measurements within normal limits will improve     Medication Management: Evaluate patient's response, side effects, and tolerance of medication regimen.  Therapeutic Interventions: 1 to 1 sessions, Unit Group sessions and Medication administration.  Evaluation of Outcomes: Not Met   RN Treatment Plan for Primary Diagnosis: <principal problem not specified> Long Term Goal(s): Knowledge of disease and therapeutic regimen to maintain health will improve  Short Term Goals: Ability to verbalize feelings will improve, Ability to disclose and discuss suicidal ideas and Ability to identify and develop effective coping behaviors will improve  Medication Management: RN will administer medications as ordered by provider, will assess and evaluate patient's response and provide education to patient for prescribed medication. RN will report any adverse and/or side effects to prescribing provider.  Therapeutic Interventions: 1 on 1 counseling sessions, Psychoeducation, Medication administration, Evaluate responses to treatment, Monitor vital signs and CBGs as ordered, Perform/monitor CIWA, COWS, AIMS and Fall Risk screenings as ordered, Perform wound care treatments as ordered.  Evaluation of Outcomes: Not Met   LCSW Treatment Plan for  Primary Diagnosis: <principal problem not specified> Long Term Goal(s): Safe transition to appropriate next level of care at discharge, Engage patient in therapeutic group addressing interpersonal concerns.  Short Term Goals: Engage patient in aftercare planning with referrals and resources  Therapeutic Interventions: Assess for all discharge needs, 1 to 1 time with Social worker, Explore available resources and support systems, Assess for adequacy in community support network, Educate family and significant other(s) on suicide prevention, Complete Psychosocial Assessment, Interpersonal group therapy.  Evaluation of Outcomes: Not Met   Progress in Treatment: Attending groups: Yes. Participating in groups: Yes. Taking medication as prescribed: Yes. Toleration medication: Yes. Family/Significant other contact made: No, will contact:  patient refuses consent at this time Patient understands diagnosis: Yes. Discussing patient identified problems/goals with staff: Yes. Medical problems stabilized or resolved: Yes. Denies suicidal/homicidal ideation: No. Issues/concerns per patient self-inventory: No. Other:   New problem(s) identified: None   New Short Term/Long Term Goal(s):medication stabilization, elimination of SI thoughts, development of comprehensive mental wellness plan.   Patient Goals:  "Find better ways to cope"  Discharge Plan or Barriers: CSW will assess for appropriate referrals and discharge planning.   Reason for Continuation of Hospitalization: Depression Medication stabilization Suicidal ideation  Estimated Length of Stay: 3-5 days   Attendees: Patient: Julie [name]  04/12/2018 11:19 AM  Physician: Dr. Myles Lipps, MD 04/12/2018 11:19 AM  Nursing: Chrys Racer.Jacinto Reap, RN 04/12/2018 11:19 AM  RN Care Manager: Rhunette Croft 04/12/2018 11:19 AM  Social Worker: Radonna Ricker, [place] 04/12/2018 11:19 AM  Recreational Therapist: Rhunette Croft 04/12/2018 11:19 AM  Other: Marvia Pickles, NP 04/12/2018  11:19 AM  Other:  04/12/2018 11:19 AM  Other: 04/12/2018 11:19 AM  Scribe for Treatment Team: Chevis Pretty 04/12/2018 11:19 AM

## 2018-04-12 NOTE — Therapy (Signed)
Occupational Therapy Group Note  Date:  04/12/2018 Time:  2:16 PM  Group Topic/Focus:  Yoga/Relaxation  Participation Level:  Active  Participation Quality:  Appropriate  Affect:  Depressed and Flat  Cognitive:  Appropriate  Insight: Improving  Engagement in Group:  Engaged  Modes of Intervention:  Activity, Discussion, Education and Socialization  Additional Comments:    S: pt did not give subjective report  O: Education given on stress management and relaxation to develop coping skills when reintegrating into community. Healthy stress management strategies brainstormed within group, pt encouraged to contribute responses. Pt guided through relaxation chair yoga. Pt asked if pain a factor in mobility, adapted exercises to mett mobility needs and safety. PMR delivered at end of session.   A: Pt presents to group, guarded with flat affect. Pt engaged and participatory, but did not speak throughout session. Pt did not contribute to brainstorming, but did participate fully in yoga and PMR. Increase relaxation response noted at end of session.  P: OT will continue to follow up for implementation of coping skills while pt acute.  Dalphine Handing, MSOT, OTR/L Behavioral Health OT/ Acute Relief OT  Dalphine Handing 04/12/2018, 2:16 PM

## 2018-04-13 DIAGNOSIS — B259 Cytomegaloviral disease, unspecified: Secondary | ICD-10-CM

## 2018-04-13 HISTORY — DX: Cytomegaloviral disease, unspecified: B25.9

## 2018-04-13 MED ORDER — ESCITALOPRAM OXALATE 5 MG PO TABS: 5.0000 mg | ORAL_TABLET | Freq: Every day | ORAL | 0 refills | Status: DC

## 2018-04-13 MED ORDER — SULFAMETHOXAZOLE-TRIMETHOPRIM 400-80 MG PO TABS
1.0000 | ORAL_TABLET | Freq: Two times a day (BID) | ORAL | Status: DC
  Administered 2018-04-13: 1 via ORAL
  Filled 2018-04-13 (×6): qty 1

## 2018-04-13 MED ORDER — SULFAMETHOXAZOLE-TRIMETHOPRIM 400-80 MG PO TABS: 1.0000 | ORAL_TABLET | Freq: Two times a day (BID) | ORAL | 0 refills | Status: DC

## 2018-04-13 MED ORDER — NORGESTIM-ETH ESTRAD TRIPHASIC 0.18/0.215/0.25 MG-35 MCG PO TABS: 1.0000 | ORAL_TABLET | Freq: Every day | ORAL | 11 refills | Status: DC

## 2018-04-13 MED ORDER — TRAZODONE HCL 50 MG PO TABS: 25.0000 mg | ORAL_TABLET | Freq: Every evening | ORAL | 0 refills | Status: DC | PRN

## 2018-04-13 MED ORDER — HYDROXYZINE HCL 25 MG PO TABS: 25.0000 mg | ORAL_TABLET | Freq: Four times a day (QID) | ORAL | 0 refills | Status: DC | PRN

## 2018-04-13 NOTE — Progress Notes (Signed)
Writer noticed that pt's UA looked suspect and notified the provider to review the results.  Provider ordered a urine culture to be done.  Writer also had pt's VS checked this evening and pt has a low grade fever of 99.45F.  Will continue to monitor for symptoms/complaints.

## 2018-04-13 NOTE — Progress Notes (Signed)
Discharge note: Patient reviewed discharge paperwork with RN including prescriptions, follow up appointments, and lab work. Patient given the opportunity to ask questions. All concerns were addressed. All belongings were returned to patient. Denied SI/HI/AVH. Patient thanked staff for their care while at the hospital.  Patient was discharged to lobby. Patient's car is in the parking lot and she is driving herself home.

## 2018-04-13 NOTE — Discharge Summary (Signed)
Physician Discharge Summary Note  Patient:  Julie [name] is an 17 y.o., female  MRN:  [id number]  DOB:  [birth year]  Patient phone:  [phone] (home)   Patient address:   [address] [place] [address],   Total Time spent with patient: Greater than 30 minutes  Date of Admission:  04/11/2018  Date of Discharge: 04-13-18  Reason for Admission: Worsening depression with suicidal thoughts.  Principal Problem: MDD (major depressive disorder), recurrent episode, severe [facility])  Discharge Diagnoses: Patient Active Problem List   Diagnosis Date Noted  . MDD (major depressive disorder), recurrent episode, severe (HCC) [F33.2] 04/11/2018    Priority: High   Past Psychiatric History: MDD  Past Medical History:  Past Medical History:  Diagnosis Date  . Dysmenorrhea   . Painful menstrual periods     Past Surgical History:  Procedure Laterality Date  . TONSILLECTOMY     Family History:  Family History  Problem Relation Age of Onset  . Hypertension Mother   . Chronic Renal Failure Father    Family Psychiatric  History: See H&P  Social History:  Social History   Substance and Sexual Activity  Alcohol Use Never  . Frequency: Never     Social History   Substance and Sexual Activity  Drug Use Never    Social History   Socioeconomic History  . Marital status: Single    Spouse name: Not on file  . Number of children: Not on file  . Years of education: Not on file  . Highest education level: Not on file  Occupational History  . Not on file  Social Needs  . Financial resource strain: Not on file  . Food insecurity:    Worry: Not on file    Inability: Not on file  . Transportation needs:    Medical: Not on file    Non-medical: Not on file  Tobacco Use  . Smoking status: Never Smoker  . Smokeless tobacco: Never Used  Substance and Sexual Activity  . Alcohol use: Never    Frequency: Never  . Drug use: Never  . Sexual activity: Not  Currently  Lifestyle  . Physical activity:    Days per week: Not on file    Minutes per session: Not on file  . Stress: Not on file  Relationships  . Social connections:    Talks on phone: Not on file    Gets together: Not on file    Attends religious service: Not on file    Active member of club or organization: Not on file    Attends meetings of clubs or organizations: Not on file    Relationship status: Not on file  Other Topics Concern  . Not on file  Social History Narrative   ** Merged History [facility] Course: (Per Md's admission evaluation): Patient is seen and examined. Patient is a 17 year old female with essentially negative past psychiatric history who presented to the behavioral health hospital as a walk-in. Patient stated that she had been increasingly more depressed over the last several weeks, and began to think about killing herself. She stated she was thinking about jumping off the back daily at her apartment. She stated that it started approximately 2 weeks ago. She is been having difficulty with nausea and vomiting. They have been unable to come up with a cause for this. She stated because she had been ill she had to drop out of college,  and that bother her a great deal. She admitted to helplessness, hopelessness and worthlessness. She she admitted to crying spells. She was admitted to the hospital for evaluation and stabilization.  After the above admission evaluation, Julie [name] was started on the medication regimen for her presenting symptoms. She received & was discharged on; Lexapro 5 mg for depression, Vistaril 25 mg prn for anxiety & Trazodone 50 mg prn for insomnia. She was also enrolled & participated in the group counseling sessions being offered & held on this unit. She presented other significant medical issues that requires treatment & or monitoring. She was started & discharged on an antibiotic therapy for urinary tract infection. She  tolerated her treatment regimen without any adverse effects or reactions reported.  At her discharge interview today with her attending psychiatrist, Julie [name] reports feeling much better.  Says being on medications has helped her a lot. She says she is no longer feeling depressed or anxious. She says she feels she is back to her old self. No thoughts of suicide.  No thoughts of homicide. No thoughts of violence. No access to weapons. Reports normal energy and interest. Has been maintaining normal biological functions. She is able to think clearly. She is able to focus on task. Her thoughts are not crowded or racing. No evidence of mania. No hallucination in any modality. She is not making any delusional statement. She is fully in touch with reality. No overwhelming anxiety. No craving for substances. She is looking forward to going home.   Julie [name]'s case was discussed at the treatment team meeting today. The nursing staff notes that she has been bright today. She has not been observed to be internally disturbed. She has not voiced any suicidal thoughts today. Patient has been cooperative with staff and has tolerated her medications well.  The treatment team members feel that patient is back to her baseline level of function. The team agreed with plan to discharge patient today to continue mental health care on an outpatient basis as noted below. She left [facility] with all personal belongings in no apparent distress.    Physical Findings: AIMS: Facial and Oral Movements Muscles of Facial Expression: None, normal Lips and Perioral Area: None, normal Jaw: None, normal Tongue: None, normal,Extremity Movements Upper (arms, wrists, hands, fingers): None, normal Lower (legs, knees, ankles, toes): None, normal, Trunk Movements Neck, shoulders, hips: None, normal, Overall Severity Severity of abnormal movements (highest score from questions above): None, normal Incapacitation due to abnormal movements: None,  normal Patient's awareness of abnormal movements (rate only patient's report): No Awareness, Dental Status Current problems with teeth and/or dentures?: No Does patient usually wear dentures?: No  CIWA:  CIWA-Ar Total: 1 COWS:  COWS Total Score: 2  Musculoskeletal: Strength & Muscle Tone: within normal limits Gait & Station: normal Patient leans: N/A  Psychiatric Specialty Exam: Physical Exam  Nursing note and vitals reviewed. Constitutional: She appears well-developed.  HENT:  Head: Normocephalic.  Eyes: Pupils are equal, round, and reactive to light.  Neck: Normal range of motion.  Cardiovascular: Normal rate.  Respiratory: Effort normal.  GI: Soft.  Genitourinary:  Genitourinary Comments: Deferred  Musculoskeletal: Normal range of motion.  Neurological: She is alert.  Skin: Skin is warm.    Review of Systems  Constitutional: Negative.   HENT: Negative.   Eyes: Negative.   Respiratory: Negative.   Cardiovascular: Negative.   Gastrointestinal: Negative.   Genitourinary: Negative.   Musculoskeletal: Negative.   Skin: Negative.   Neurological: Negative.   Endo/Heme/Allergies:  Negative.   Psychiatric/Behavioral: Positive for depression (Stable) and substance abuse (Hx. THC use disorder). Negative for hallucinations, memory loss and suicidal ideas. The patient has insomnia (Stable). The patient is not nervous/anxious.     Blood pressure 130/71, pulse (!) 133, temperature 98.4 F (36.9 C), temperature source Oral, resp. rate 16, height 5\' 6"  (1.676 m), weight 103.4 kg.Body mass index is 36.8 kg/m.  See Md's SRA   Have you used any form of tobacco in the last 30 days? (Cigarettes, Smokeless Tobacco, Cigars, and/or Pipes): No  Has this patient used any form of tobacco in the last 30 days? (Cigarettes, Smokeless Tobacco, Cigars, and/or Pipes): N/A  Blood Alcohol level:  No results found for: [facility]  Metabolic Disorder Labs:  No results found for: HGBA1C, MPG Lab Results   Component Value Date   PROLACTIN 38.2 (H) 04/11/2018   Lab Results  Component Value Date   CHOL 177 04/11/2018   TRIG 107 04/11/2018   HDL 40 (L) 04/11/2018   CHOLHDL 4.4 04/11/2018   VLDL 21 04/11/2018   LDLCALC 116 (H) 04/11/2018   See Psychiatric Specialty Exam and Suicide Risk Assessment completed by Attending Physician prior to discharge.  Discharge destination:  Home  Is patient on multiple antipsychotic therapies at discharge:  No   Has Patient had three or more failed trials of antipsychotic monotherapy by history:  No  Recommended Plan for Multiple Antipsychotic Therapies: NA  Allergies as of 04/13/2018   No Known Allergies     Medication List    STOP taking these medications   ibuprofen 800 MG tablet Commonly known as:  ADVIL,MOTRIN   ondansetron 8 MG tablet Commonly known as:  ZOFRAN     TAKE these medications     Indication  escitalopram 5 MG tablet Commonly known as:  LEXAPRO Take 1 tablet (5 mg total) by mouth daily. For depression Start taking on:  04/14/2018  Indication:  Major Depressive Disorder   hydrOXYzine 25 MG tablet Commonly known as:  ATARAX/VISTARIL Take 1 tablet (25 mg total) by mouth every 6 (six) hours as needed for anxiety.  Indication:  Feeling Anxious   Norgestimate-Ethinyl Estradiol Triphasic 0.18/0.215/0.25 MG-35 MCG tablet Take 1 tablet by mouth at bedtime. Birth control method What changed:    medication strength  additional instructions  Indication:  Birth Control Treatment   sulfamethoxazole-trimethoprim 400-80 MG tablet Commonly known as:  BACTRIM,SEPTRA Take 1 tablet by mouth every 12 (twelve) hours. For urinary tract infection.  Indication:  Urinary Tract Infection   traZODone 50 MG tablet Commonly known as:  DESYREL Take 0.5 tablets (25 mg total) by mouth at bedtime as needed for sleep.  Indication:  Trouble Sleeping      Follow-up Information    BEHAVIORAL HEALTH PARTIAL HOSPITALIZATION PROGRAM. Go on  04/17/2018.   Specialty:  Behavioral Health Why:  Appointment is Monday, 04/17/18 at 1:30pm to complete paperwork; Official appointment will start at 2:30pm. Please be sure to bring your Photo ID and any discharge paperwork from this hospitalization.  Contact information: [address] [id number] mc [place] [address] [phone]         Follow-up recommendations: Activity:  As tolerated Diet: As recommended by your primary care doctor. Keep all scheduled follow-up appointments as recommended.  Comments: Patient is instructed prior to discharge to: Take all medications as prescribed by his/her mental healthcare provider. Report any adverse effects and or reactions from the medicines to his/her outpatient provider promptly. Patient has been instructed &  cautioned: To not engage in alcohol and or illegal drug use while on prescription medicines. In the event of worsening symptoms, patient is instructed to call the crisis hotline, 911 and or go to the nearest ED for appropriate evaluation and treatment of symptoms. To follow-up with his/her primary care provider for your other medical issues, concerns and or health care needs.   Signed: Armandina Stammer, NP, PMHNP, FNP-BC 04/13/2018, 10:18 AM

## 2018-04-13 NOTE — BHH Suicide Risk Assessment (Signed)
[facility] Discharge Suicide Risk Assessment   Principal Problem: <principal problem not specified> Discharge Diagnoses:  Patient Active Problem List   Diagnosis Date Noted  . MDD (major depressive disorder), recurrent episode, severe (HCC) [F33.2] 04/11/2018    Total Time spent with patient: 15 minutes  Musculoskeletal: Strength & Muscle Tone: within normal limits Gait & Station: normal Patient leans: N/A  Psychiatric Specialty Exam: Review of Systems  All other systems reviewed and are negative.   Blood pressure 130/71, pulse (!) 133, temperature 98.4 F (36.9 C), temperature source Oral, resp. rate 16, height 5\' 6"  (1.676 m), weight 103.4 kg.Body mass index is 36.8 kg/m.  General Appearance: Casual  Eye Contact::  Fair  Speech:  Normal Rate409  Volume:  Decreased  Mood:  Euthymic  Affect:  Congruent  Thought Process:  Coherent and Descriptions of Associations: Intact  Orientation:  Full (Time, Place, and Person)  Thought Content:  Logical  Suicidal Thoughts:  No  Homicidal Thoughts:  No  Memory:  Immediate;   Fair Recent;   Fair Remote;   Fair  Judgement:  Intact  Insight:  Fair  Psychomotor Activity:  Normal  Concentration:  Fair  Recall:  [employer] of Knowledge:Good  Language: Good  Akathisia:  Negative  Handed:  Right  AIMS (if indicated):     Assets:  Desire for Improvement Financial Resources/Insurance Housing Physical Health Resilience Social Support  Sleep:  Number of Hours: 6.75  Cognition: WNL  ADL's:  Intact   Mental Status Per Nursing Assessment::   On Admission:  Suicidal ideation indicated by patient  Demographic Factors:  Adolescent or young adult  Loss Factors: Decline in physical health  Historical Factors: Impulsivity  Risk Reduction Factors:   Sense of responsibility to family, Living with another person, especially a relative and Positive social support  Continued Clinical Symptoms:  Depression:   Impulsivity  Cognitive  Features That Contribute To Risk:  None    Suicide Risk:  Minimal: No identifiable suicidal ideation.  Patients presenting with no risk factors but with morbid ruminations; may be classified as minimal risk based on the severity of the depressive symptoms    Plan Of Care/Follow-up recommendations:  Activity:  ad lib  Antonieta Pert, MD 04/13/2018, 7:56 AM

## 2018-04-13 NOTE — Plan of Care (Addendum)
Patient self inventory- Patient slept fair last night, sleep medication was not requested. Appetite has been fair, energy level normal, concentration good. Depression, hopelessness, and anxiety rated 3, 2, 5. Denies SI HI AVH. Denies physical pain and problems. Patient's goal is to "get out of here."  Patient is compliant with medications prescribed per provider. No side effects noted. Safety is maintained with 15 minute checks as well as environmental checks. Will continue to monitor.  Problem: Education: Goal: Knowledge of [facility] Education information/materials will improve Outcome: Progressing Goal: Mental status will improve Outcome: Progressing   Problem: Activity: Goal: Interest or engagement in activities will improve Outcome: Progressing Goal: Sleeping patterns will improve Outcome: Progressing   Problem: Coping: Goal: Ability to verbalize frustrations and anger appropriately will improve Outcome: Progressing   Problem: Safety: Goal: Periods of time without injury will increase Outcome: Progressing   Problem: Self-Concept: Goal: Ability to disclose and discuss suicidal ideas will improve Outcome: Progressing   Problem: Education: Goal: Emotional status will improve Outcome: Not Progressing   Problem: Coping: Goal: Coping ability will improve Outcome: Not Progressing

## 2018-04-13 NOTE — Progress Notes (Signed)
  [facility] Adult Case Management Discharge Plan :  Will you be returning to the same living situation after discharge:  Yes,  patient reports she is returning home to her apartment, however she plans on moving in with her parents soon At discharge, do you have transportation home?: Yes,  patient reports her car is in the [facility] parking lot Do you have the ability to pay for your medications: Yes,  [employer], support from parents  Release of information consent forms completed and in the chart;  Patient's signature needed at discharge.  Patient to Follow up at: Follow-up Information    BEHAVIORAL HEALTH PARTIAL HOSPITALIZATION PROGRAM. Go on 04/17/2018.   Specialty:  Behavioral Health Why:  Appointment is Monday, 04/17/18 at 1:30pm to complete paperwork; Official appointment will start at 2:30pm. Please be sure to bring your Photo ID and any discharge paperwork from this hospitalization.  Contact information: [address] [id number] mc [place] [address] [phone]          Next level of care provider has access to [facility] Link:yes  Safety Planning and Suicide Prevention discussed: Yes,  with the patient  Have you used any form of tobacco in the last 30 days? (Cigarettes, Smokeless Tobacco, Cigars, and/or Pipes): No  Has patient been referred to the Quitline?: N/A patient is not a smoker  Patient has been referred for addiction treatment: N/A  [name], LCSWA 04/13/2018, 10:13 AM

## 2018-04-13 NOTE — Progress Notes (Signed)
Pt reports she has a better day today.  She denies SI/HI/AVH.  She says she spent more time out of her room and attended groups.  She voiced no needs or concerns.  Pt says she may be discharging tomorrow or Friday.  Pt says her car is in the parking lot, so she has her own transportation.  Support and encouragement offered.  Discharge plans are in process.  Safety maintained with q15 minute checks.

## 2018-04-14 LAB — URINE CULTURE: Special Requests: NORMAL

## 2018-04-17 ENCOUNTER — Other Ambulatory Visit (HOSPITAL_COMMUNITY): Payer: 59 | Attending: Psychiatry | Admitting: Licensed Clinical Social Worker

## 2018-04-17 DIAGNOSIS — Z79899 Other long term (current) drug therapy: Secondary | ICD-10-CM | POA: Diagnosis not present

## 2018-04-17 DIAGNOSIS — R45851 Suicidal ideations: Secondary | ICD-10-CM | POA: Diagnosis not present

## 2018-04-17 DIAGNOSIS — F329 Major depressive disorder, single episode, unspecified: Secondary | ICD-10-CM | POA: Diagnosis not present

## 2018-04-17 DIAGNOSIS — F332 Major depressive disorder, recurrent severe without psychotic features: Secondary | ICD-10-CM

## 2018-04-18 ENCOUNTER — Encounter (HOSPITAL_COMMUNITY): Payer: Self-pay | Admitting: Occupational Therapy

## 2018-04-18 ENCOUNTER — Other Ambulatory Visit (HOSPITAL_COMMUNITY): Payer: 59 | Admitting: Licensed Clinical Social Worker

## 2018-04-18 ENCOUNTER — Encounter (HOSPITAL_COMMUNITY): Payer: Self-pay

## 2018-04-18 ENCOUNTER — Other Ambulatory Visit: Payer: Self-pay

## 2018-04-18 ENCOUNTER — Other Ambulatory Visit (HOSPITAL_COMMUNITY): Payer: 59 | Admitting: Occupational Therapy

## 2018-04-18 VITALS — BP 116/78 | HR 86 | Ht 65.75 in | Wt 223.0 lb

## 2018-04-18 DIAGNOSIS — F329 Major depressive disorder, single episode, unspecified: Secondary | ICD-10-CM | POA: Diagnosis not present

## 2018-04-18 DIAGNOSIS — F070 Personality change due to known physiological condition: Secondary | ICD-10-CM

## 2018-04-18 DIAGNOSIS — R4589 Other symptoms and signs involving emotional state: Secondary | ICD-10-CM

## 2018-04-18 DIAGNOSIS — F322 Major depressive disorder, single episode, severe without psychotic features: Secondary | ICD-10-CM

## 2018-04-18 DIAGNOSIS — Z79899 Other long term (current) drug therapy: Secondary | ICD-10-CM | POA: Diagnosis not present

## 2018-04-18 NOTE — Progress Notes (Signed)
Behavioral Health Partial Program Assessment Note  Date: 04/18/2018 Name: Julie [name] MRN: [id number]    HPI: Julie [name]  is a 17 y.o. African American female presents with worseing depression and suicidal ideations.patient reports battling depression since around age 23. Reports she has intermittent suicidal ideation without plan or intent, however reports more recently she has been dealing with multiple stressors.  Reports she recently dropped out of school.  Reports she feels like to go between between her parents recent separation.  States she is currently unemployed as she used to work for [employer].  Reports her younger brother is dealing with court situation.  Reports her oldest brother recently got kicked out of his house as his has to care for 4-year-old child.    Reports previous attempts to overdose however called the crisis hotline. Patient reports history of self injourse behaviors (cutting to leg). Reports she has cut more recently. Julie [name] reports racing thoughts, social anxiety and poor appetite with fluctuation in her weight.  Reports a family history of mental illness; father: depression, paternal  grandfather depression.  Patient was enrolled in partial psychiatric program on 04/18/18. Support, encouragement and reassurance was provided.  Primary complaints include: depression worse, feeling depressed, feeling suicidal, financial problems and poor concentration.  Onset of symptoms was gradual with stable course since that time. Psychosocial Stressors include the following: family and occupational.   I have reviewed the following documentation dated 04/18/2018: past psychiatric history  Complaints of Pain: nonear Past Psychiatric History: attempt to overdose First psychiatric contact  and Drug/alcohol THC  Currently in treatment with Lexapro 5 mg   Substance Abuse History: alcohol Use of Alcohol: occasional, social use  Use of Caffeine: denies use Use of over the  counter:   Past Surgical History:  Procedure Laterality Date  . TONSILLECTOMY      Past Medical History:  Diagnosis Date  . CMV (cytomegalovirus infection) (HCC) 04/13/2018  . Dysmenorrhea   . Painful menstrual periods    Outpatient Encounter Medications as of 04/18/2018  Medication Sig Note  . escitalopram (LEXAPRO) 5 MG tablet Take 1 tablet (5 mg total) by mouth daily. For depression   . hydrOXYzine (ATARAX/VISTARIL) 25 MG tablet Take 1 tablet (25 mg total) by mouth every 6 (six) hours as needed for anxiety. 04/18/2018: Takes once a day.   . Norgestimate-Ethinyl Estradiol Triphasic (TRI-SPRINTEC) 0.18/0.215/0.25 MG-35 MCG tablet Take 1 tablet by mouth at bedtime. Birth control method   . sulfamethoxazole-trimethoprim (BACTRIM,SEPTRA) 400-80 MG tablet Take 1 tablet by mouth every 12 (twelve) hours. For urinary tract infection.   . traZODone (DESYREL) 50 MG tablet Take 0.5 tablets (25 mg total) by mouth at bedtime as needed for sleep.    No facility-administered encounter medications on file as of 04/18/2018.    No Known Allergies  Social History   Tobacco Use  . Smoking status: Never Smoker  . Smokeless tobacco: Never Used  Substance Use Topics  . Alcohol use: Never    Frequency: Never   Functioning Relationships: good support system and good relationship with parents Education: College       Please specify degree:  AAS Other Pertinent History: None Family History  Problem Relation Age of Onset  . Hypertension Mother   . Chronic Renal Failure Father   . Depression Father   . Alcohol abuse Maternal Uncle   . Depression Paternal Uncle   . Dementia Maternal Grandmother   . Depression Paternal Grandfather   . Depression Paternal Grandmother  Review of Systems Constitutional: negative  Objective:  Vitals:   04/18/18 0912  BP: 116/78  Pulse: 86  SpO2: 98%    Physical Exam:   Mental Status Exam: Appearance:  Well groomed Psychomotor::  Within Normal  Limits Attention span and concentration: Normal Behavior: calm and cooperative Speech:  normal volume Mood:  depressed and anxious Affect:  Normal, blunted and flat but pleasant  Thought Process:  Coherent Thought Content:  Hallucinations: None Orientation:  person, place and year Cognition:  grossly intact Insight:  Intact Judgment:  Intact Estimate of Intelligence: Average Fund of knowledge: Aware of current events Memory: Recent and remote intact Abnormal movements: None Gait and station: Normal  Assessment:  Diagnosis: No primary diagnosis found. No diagnosis found.  Indications for admission: inpatient care required if not in partial hospital program  Plan: Orders placed for  occupational therapy (OT) patient enrolled in Partial Hospitalization Program, patient's current medications are to be continued, a comprehensive treatment plan will be developed and side effects of medications have been reviewed with patient  MDD:  Increased  Lexapro 5 mg to 10 mg Po QD  Continue Trazodone 25 mg PO QHS   Treatment options and alternatives reviewed with patient and patient understands the above plan, Treatment plan was reviewed and agreed upon by NP. T.Lewis and patient Julie [name] need for group services .    Oneta Rack, NP

## 2018-04-18 NOTE — Progress Notes (Signed)
Patient presented with sad affect, depressed mood but denied any current suicidal or homicidal ideations, no auditory or visual hallucinations and no plan or intent to want to harm self or others.  Patient reported she recently lost her job and dropped out of school.  States she was "dealing with a lot" as her Mother and Father recently separated and states she often felt like a parent to them.  Patient reported she is also concerned about her brother's pending charges and was feeling overwhelmed.  Patient reported at times positive suicidal ideations but no plan or intent and rated her current level of depression a 2, anxiety a 5, and hopelessness a 2 on a scale of 0-10 with 0 being none and 10 the worst he could manage.  Patient reported 2 years back she called a suicide hotline for thoughts of wanting to overdose then but did not act on thoughts.  Reports depression worsened around age 62 and thoughts of suicide then but no plan or intent and has not actively tried to harm self in the past.  Patient reported at times she gets a little groggy with taking Hydroxyzine medications, takes one a day typically in the mornings and encouraged to change the time she takes this to help with sedation effect.  Patient reported she has been cutting self periodically since she was 87-69 years of age with most recent cutting incident 1 week prior, mainly on her thigh areas but states these are superficial cuts.  Patient agreed with plan with Hillery Jacks, NP to increase Lexapro to 10 mg a day, and will consider trying a lower dosage of Hydroxyzine to avoid sedation but to still help with anxiety per patient's report this medication does help some with that currently.  Patient denied any current side effects to medications or other concerns and agreed to keep this nurse or PHP staff informed of any worsening of symptoms, problems with medications or negative changes in mood with PHP treatment option.

## 2018-04-18 NOTE — Psych (Signed)
Comprehensive Clinical Assessment (CCA) Note  04/18/2018 Julie [name] [id number]  Visit Diagnosis:      ICD-10-CM   1. Severe episode of recurrent major depressive disorder, without psychotic features (HCC) F33.2       CCA Part One  Part One has been completed on paper by the patient.  (See scanned document in Chart Review)  CCA Part Two A  Intake/Chief Complaint:  CCA Intake With Chief Complaint CCA Part Two Date: 04/17/18 CCA Part Two Time: 1430 Chief Complaint/Presenting Problem: Pt presents to PHP for ax per inpt. Pt was inpt due to SI with plan to jump off balcony at apt. Pt reports hx of depression and denies tx hx. Pt reports she has cut herself "a couple of times" since middle school; most recently last week on thighs. Pt states cuts did not need medical attention. Pt reports she dropped out of college and quit her job due to depression symptoms. Pt reports she also was sick "with maybe Mono" and felt weak for a couple of months. Pt reports weight loss from 250 to 218 in a couple of months; pt states she is back to 236lbs. Pt reports decrease in ADLs: Pt is showering about 1x a week; pt not cooking or cleaning. Pt reports lack of supports and identifies mother and father "but I don't talk to them about this stuff" and "maybe one friend." Pt denies HI/AVH and denies plan/intent with SI. Patients Currently Reported Symptoms/Problems: SI; increased depression and anxiety; decrease in appetite; tearfulness; decreased ADLs; hopelessness; worthlessness; trouble sleeping (pt reports meds are helping); problems with work and school; racing thoughts; memory problems; anhedonia; irritability; excessive worrying; self-harm; low energy; poor concentration Individual's Strengths: motivation for tx Type of Services Patient Feels Are Needed: "I enjoyed groups while in the hospital. I think that would really help me continue to get better."  Mental Health Symptoms Depression:  Depression: Change  in energy/activity, Difficulty Concentrating, Fatigue, Hopelessness, Increase/decrease in appetite, Irritability, Sleep (too much or little), Tearfulness, Weight gain/loss, Worthlessness  Mania:     Anxiety:   Anxiety: Restlessness, Worrying, Tension, Irritability, Fatigue, Difficulty concentrating, Sleep  Psychosis:     Trauma:     Obsessions:     Compulsions:     Inattention:     Hyperactivity/Impulsivity:     Oppositional/Defiant Behaviors:     Borderline Personality:     Other Mood/Personality Symptoms:      Mental Status Exam Appearance and self-care  Stature:  Stature: Average  Weight:  Weight: Overweight  Clothing:  Clothing: Casual  Grooming:  Grooming: Neglected(hair disheveled)  Cosmetic use:  Cosmetic Use: None  Posture/gait:  Posture/Gait: Normal  Motor activity:  Motor Activity: Not Remarkable  Sensorium  Attention:  Attention: Normal  Concentration:  Concentration: Normal  Orientation:  Orientation: X5  Recall/memory:  Recall/Memory: Normal  Affect and Mood  Affect:  Affect: Depressed  Mood:  Mood: Depressed  Relating  Eye contact:  Eye Contact: Fleeting  Facial expression:  Facial Expression: Depressed  Attitude toward examiner:  Attitude Toward Examiner: Cooperative  Thought and Language  Speech flow: Speech Flow: Normal  Thought content:  Thought Content: Appropriate to mood and circumstances  Preoccupation:     Hallucinations:     Organization:     Company secretary of Knowledge:  Fund of Knowledge: Average  Intelligence:  Intelligence: Average  Abstraction:  Abstraction: Normal  Judgement:  Judgement: Poor  Reality Testing:  Reality Testing: Adequate  Insight:  Insight: Poor  Decision Making:  Decision Making: Vacilates, Paralyzed, Impulsive  Social Functioning  Social Maturity:  Social Maturity: Isolates  Social Judgement:  Social Judgement: Normal  Stress  Stressors:  Stressors: Family conflict, Work, Transitions, Illness  Coping  Ability:  Coping Ability: Deficient supports  Skill Deficits:     Supports:      Family and Psychosocial History: Family history Marital status: Single Are you sexually active?: No What is your sexual orientation?: Heterosexual  Has your sexual activity been affected by drugs, alcohol, medication, or emotional stress?: No  Does patient have children?: No  Childhood History:  Childhood History By whom was/is the patient raised?: Both parents Additional childhood history information: Pt reports childhood was "tense" because parents did not get along most of the time. Description of patient's relationship with caregiver when they were a child: Patient reports having a "normal" relationship with her parents as a child.  Patient's description of current relationship with people who raised him/her: Patient reports she currently has a strained relationship with her parents due to parents seperation and "I'm in the middle." How were you disciplined when you got in trouble as a child/adolescent?: Whoopings Does patient have siblings?: Yes Number of Siblings: 2 Description of patient's current relationship with siblings: Patient reports having a distant relationship with her older brother, however she and her younger brother are relatively close.  Did patient suffer any verbal/emotional/physical/sexual abuse as a child?: No Did patient suffer from severe childhood neglect?: No Has patient ever been sexually abused/assaulted/raped as an adolescent or adult?: No Was the patient ever a victim of a crime or a disaster?: No Witnessed domestic violence?: No Has patient been effected by domestic violence as an adult?: No  CCA Part Two B  Employment/Work Situation: Employment / Work Psychologist, occupational Employment situation: Surveyor, minerals job has been impacted by current illness: Yes Describe how patient's job has been impacted: Patient reports her physical illness and depression symptoms caused her to  quit her job. What is the longest time patient has a held a job?: 6 months  Where was the patient employed at that time?: [employer]  Did You Receive Any Psychiatric Treatment/Services While in the [employer]?: No Are There Guns or Other Weapons in Your Home?: No  Education: Engineer, civil (consulting) Currently Attending: [employer] Name of High School: [employer] Did Garment/textile technologist From [employer]?: Yes Did Theme park manager?: Yes Did You Have An Individualized Education Program (IIEP): No Did You Have Any Difficulty At School?: Yes(Pt reports she recently dropped out due to symptoms.) Were Any Medications Ever Prescribed For These Difficulties?: Yes Medications Prescribed For School Difficulties?: "depression meds and all types of meds for flu"  Religion: Religion/Spirituality Are You A Religious Person?: No  Leisure/Recreation: Leisure / Recreation Leisure and Hobbies: Kniting, drawing  Exercise/Diet: Exercise/Diet Do You Exercise?: No Have You Gained or Lost A Significant Amount of Weight in the Past Six Months?: Yes-Lost Number of Pounds Lost?: 35(Pt reports she has gained about half of that weight back) Do You Follow a Special Diet?: No Do You Have Any Trouble Sleeping?: Yes Explanation of Sleeping Difficulties: Pt reports struggling with sleep prior to medications being rx.  CCA Part Two C  Alcohol/Drug Use: Alcohol / Drug Use Pain Medications: None Prescriptions: see MAR Over the Counter: Ibuprophen, nausea medicine History of alcohol / drug use?: No history of alcohol / drug abuse     CCA Part Three  ASAM's:  Six Dimensions of Multidimensional Assessment  Dimension 1:  Acute  Intoxication and/or Withdrawal Potential:     Dimension 2:  Biomedical Conditions and Complications:     Dimension 3:  Emotional, Behavioral, or Cognitive Conditions and Complications:     Dimension 4:  Readiness to Change:     Dimension 5:  Relapse, Continued use, or Continued Problem Potential:      Dimension 6:  Recovery/Living Environment:      Substance use Disorder (SUD)    Social Function:  Social Functioning Social Maturity: Isolates Social Judgement: Normal  Stress:  Stress Stressors: Family conflict, Work, Transitions, Illness Coping Ability: Deficient supports Patient Takes Medications The Way The Doctor Instructed?: Yes Priority Risk: Moderate Risk  Risk Assessment- Self-Harm Potential: Risk Assessment For Self-Harm Potential Thoughts of Self-Harm: Vague current thoughts Method: No plan Availability of Means: No access/NA Additional Information for Self-Harm Potential: Acts of Self-harm, Preoccupation with Death Additional Comments for Self-Harm Potential: Pt just d/c from inpt due to SI with plan. Pt reports she cut herself last week on the thigh.  Risk Assessment -Dangerous to Others Potential: Risk Assessment For Dangerous to Others Potential Method: No Plan  DSM5 Diagnoses: Patient Active Problem List   Diagnosis Date Noted  . MDD (major depressive disorder), recurrent episode, severe (HCC) 04/11/2018    Patient Centered Plan: Patient is on the following Treatment Plan(s):  Depression  Recommendations for Services/Supports/Treatments: Recommendations for Services/Supports/Treatments Recommendations For Services/Supports/Treatments: Partial Hospitalization(Pt just d/c from inpt due to SI with plan. Pt lacks support and wants to gain coping skills. Pt has hx of self-harm. Pt has no tx hx and needs providers.)  Treatment Plan Summary:  Pt states "I don't want to feel like this. I want to get back to school."  Referrals to Alternative Service(s): Referred to Alternative Service(s):   Place:   Date:   Time:    Referred to Alternative Service(s):   Place:   Date:   Time:    Referred to Alternative Service(s):   Place:   Date:   Time:    Referred to Alternative Service(s):   Place:   Date:   Time:     Kynlee Koenigsberg J Mylissa Lambe, LPCA, LCASA

## 2018-04-18 NOTE — Therapy (Signed)
[facility] [address], [place], [address] Phone: [phone]   Fax:  [phone]  Occupational Therapy Evaluation  Patient Details  Name: Julie [name] MRN: [id number] Date of Birth: [birth year] Referring Provider (OT): Ricky Ala, NP   Encounter Date: 04/18/2018  OT End of Session - 04/18/18 1241    Visit Number  1    Number of Visits  16    Date for OT Re-Evaluation  05/16/18    Authorization Type  [employer]    OT Start Time  1030    OT Stop Time  1130    OT Time Calculation (min)  60 min    Activity Tolerance  Patient tolerated treatment well    Behavior During Therapy  [facility] for tasks assessed/performed       Past Medical History:  Diagnosis Date  . CMV (cytomegalovirus infection) ([place]) 04/13/2018  . Dysmenorrhea   . Painful menstrual periods     Past Surgical History:  Procedure Laterality Date  . TONSILLECTOMY      There were no vitals filed for this visit.  Subjective Assessment - 04/18/18 0948    Currently in Pain?  No/denies        [facility] OT Assessment - 04/18/18 0001      Assessment   Medical Diagnosis  Major Depressive disorder, recurrent, severe    Referring Provider (OT)  Ricky Ala, NP    Onset Date/Surgical Date  04/18/18      Precautions   Precautions  None      Restrictions   Weight Bearing Restrictions  No      Balance Screen   Has the patient fallen in the past 6 months  No    Has the patient had a decrease in activity level because of a fear of falling?   No    Is the patient reluctant to leave their home because of a fear of falling?   No        OT assessment: OCAIRS  Diagnosis: Major depressive disorder, recurrent, severe  Past medical history/referral information: Pt presents to [facility] after a stay in [facility] for SI with a plan to jump off of her apartment balcony. Pt presents to evaluation with blunted affect and mentions of poor personal hygiene. This date pt  presents well groomed and causal.   Living situation: Pt was living in student apartment, but plans to move back in with father (has been staying there since d/c from [facility]).  ADLs/IADLs: Pt reports decreased engagement in this area and poor personal hygiene.  Sleep: Pt reports she sleeps 4-8 hours depending if she is compliant with her sleep medication or if she does/does not stay up late.  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 Pt currently is not working and not going to school and was not days leading to [facility] stay.  Leisure: Firefighter, on and off  Social support: mentions of father and some friends. Pt says she has not been isolating and being around others  Struggles: sleep, personal hygiene, coping strategies  [place] Summary of Client Scores:  FACILITATES PARTICIPATION IN [place]                X No current productive roles or meaningful engagement. Not working/going to school  HABITS  X No current functional routine after change in roles  PERSONAL CAUSATION                X Only identifies areas of pride as her hair, anticipates stress and worry in the future  VALUES               X  Identifies appropriate values, but does not consider herself to be following any of them  INTERESTS               X  Loosely identifies knitting  SKILLS               X  Difficulty concentrating, problem solving, STM  SHORT TERM GOALS               X  Just started setting 2 days ago and reports increased motivation and pride  LONG TERM GOALS                X None at this time  INTERPETATION OF PAST EXPERIENCES              X   Equal emphasis of bad and good  PHYSICAL ENVIRONMENT              X   Drives, gets physical needs met with some A from dad  [place]               X   Reports no increase in isolation  READINESS FOR CHANGE               X  In need of routine, does not currently have    Need for Occupational Therapy:  4 Shows positive occupational participation, no need for OT.   3 Need for minimal intervention/consultative participation     X 2 Need for OT intervention indicated to restore/improve participation   1 Need for extensive OT intervention indicated to improve participation.  Referral for follow up services also recommended.    Assessment:  Patient demonstrates behavior that inhibits participation in occupation.  Patient will benefit from occupational therapy intervention in order to improve time management, financial management, stress management, job readiness skills, social skills, and health management skills in preparation to return to full time community living and to be a productive community member.   Plan:  Patient will participate in skilled occupational therapy sessions individually or in a group setting to improve coping skills, psychosocial skills, and emotional skills required to return to prior level of function. Treatment will be 4-5 times per week for 3 weeks.     OT TREATMENT- STRESS MANAGEMENT  S: "Deep breathing does not work for me"  O: Stress management group completed to use as productive coping strategy, to help mitigate maladaptive coping to integrate in functional BADL/IADL.  Stress management tool worksheet discussed to educate on unhealthy vs healthy coping skills to manage stress to improve community integration. Coping strategies taught include: relaxation based- deep breathing, counting to 10, taking a 1 minute vacation, acceptance, stress balls, relaxation audio/video, visual/mental imagery. Positive mental attitude- gratitude, acceptance, cognitive reframing, positive self talk, anger management. Mood tracker given at end of session.  A: Pt presents to group with flat affect. Pt engaged and participatory with  facilitator and other group members this date. Stress management tools worksheet completed, with pt identifying that she suppresses emotions. She plans to implement sleep yoga into her routine for stress management. Pt willing to trail mood tracker as well  P: Pt provided with education on stress management activities. OT will continue to follow up with activities learned for successful implementation into daily life             OT Education - 04/18/18 1241    Education Details  education given on stress management this date    Person(s) Educated  Patient    Methods  Explanation;Handout    Comprehension  Verbalized understanding       OT Short Term Goals - 04/18/18 0952      OT SHORT TERM GOAL #1   Title  Pt will apply psychosocial skills and coping mechanisms to daily activities in order to function independently and reintegrate into community dwelling    Time  4    Period  Weeks    Status  New    Target Date  05/16/18      OT SHORT TERM GOAL #2   Title  Pt will be educated on strategies to improve psychosocial skills needed to participate fully in all daily, work, and leisure activities    Time  4    Period  Weeks    Status  New    Target Date  05/16/18      OT SHORT TERM GOAL #3   Title  Pt will recall and apply 1-3 sleep hygiene strategies to improve performance in BADL routine    Time  4    Period  Weeks    Status  New    Target Date  05/16/18      OT SHORT TERM GOAL #4   Title  Pt will create and apply BADL routine to improve quality and frequency of personal hygiene    Time  4    Period  Weeks    Status  New    Target Date  05/16/18               Plan - 04/18/18 8372    Occupational performance deficits (Please refer to evaluation for details):  ADL's;IADL's;Rest and Sleep;Education;Work;Leisure;Social Participation    Rehab Potential  Good    OT Frequency  5x / week    OT Duration  4 weeks    OT Treatment/Interventions  Psychosocial skills  training;Coping strategies training;Self-care/ADL training;Other (comment)   community reintegration   Consulted and Agree with Plan of Care  Patient       Patient will benefit from skilled therapeutic intervention in order to improve the following deficits and impairments:  Decreased coping skills, Decreased psychosocial skills, Other (comment)(decreased ability to engage in BADL and reintegrate into community)  Visit Diagnosis: Organic personality disorder  Difficulty coping    Problem List Patient Active Problem List   Diagnosis Date Noted  . MDD (major depressive disorder), recurrent episode, severe ([place]) 04/11/2018   Zenovia Jarred, MSOT, OTR/L Behavioral Health OT/ Acute Relief OT PHP Office: [place], MSOT, OTR/L Behavioral Health OT/ Acute Relief OT PHP Office: [phone]  Zenovia Jarred 04/18/2018, 12:43 PM  [facility] PARTIAL HOSPITALIZATION PROGRAM [place], [place], [address] Phone: [phone]   Fax:  [phone]  Name: Julie [name] MRN: [id number] Date of Birth: 02-17-2001

## 2018-04-19 ENCOUNTER — Other Ambulatory Visit (HOSPITAL_COMMUNITY): Payer: 59 | Admitting: Licensed Clinical Social Worker

## 2018-04-19 ENCOUNTER — Encounter (HOSPITAL_COMMUNITY): Payer: Self-pay | Admitting: Family

## 2018-04-19 DIAGNOSIS — F332 Major depressive disorder, recurrent severe without psychotic features: Secondary | ICD-10-CM

## 2018-04-19 DIAGNOSIS — F329 Major depressive disorder, single episode, unspecified: Secondary | ICD-10-CM | POA: Diagnosis not present

## 2018-04-19 MED ORDER — ESCITALOPRAM OXALATE 5 MG PO TABS: 10.0000 mg | ORAL_TABLET | Freq: Every day | ORAL | 0 refills | Status: DC

## 2018-04-19 NOTE — Psych (Signed)
   Julie [name]  DAVEN PINCKNEY [id number]  Session Time: 9:00 - 11:00  Participation Level: Active  Behavioral Response: CasualAlertDepressed  Type of Therapy: Group Therapy  Treatment Goals addressed: Coping  Interventions: CBT, DBT, Solution Focused, Supportive and Reframing  Summary: Clinician led check-in regarding current stressors and situation, and review of patient completed daily inventory. Clinician utilized active listening and empathetic response and validated patient emotions. Clinician facilitated processing group on pertinent issues.   Therapist Response: [name] is a 17 y.o. female who presents with depression symptoms. Patient arrived within time allowed and reports that she is feeling "pretty good." Patient rates her mood at a 8 on a scale of 1-10 with 10 being great. Pt reports she found out yesterday that the medical concern she had is likely a false positive and that provided a lot of relief for her. Pt states she spent time with her father yesterday and was able to share with him some of what she is going through and felt support from him. Patient states struggling with her sleep last night. Patient engaged in discussion.      Session Time: 11:00 -12:00  Participation Level: Active  Behavioral Response: CasualAlertDepressed  Type of Therapy: Group Therapy, psychotherapy  Treatment Goals addressed: Coping  Interventions: Psychosocial skills training, Supportive  Summary:  Occupational therapy group  Therapist Response: Patient engaged in group. See OT [name].         Session Time: 12:00 - 12:45  Participation Level: Active  Behavioral Response: CasualAlertDepressed  Type of Therapy: Group Therapy  Treatment Goals addressed: Coping  Interventions: Psychologist, occupational, Supportive  Summary:  Reflection Group: Patients encouraged to practice skills and interpersonal techniques or work on  mindfulness and relaxation techniques. The importance of self-care and making skills part of a routine to increase usage were stressed   Therapist Response: Patient engaged and participated appropriately.        Session Time: 12:45- 2:00  Participation Level: Active  Behavioral Response: CasualAlertDepressed  Type of Therapy: Group Therapy, Psychoeducation; Psychotherapy  Treatment Goals addressed: Coping  Interventions: CBT; Solution focused; Supportive; Reframing  Summary: 12:45 - 1:50: Clinician continued topic of cognitive distortions. Group reviewed cognitive distortion handout and worked to recognize examples from their own lives. 1:50 -2:00 Clinician led check-out. Clinician assessed for immediate needs, medication compliance and efficacy, and safety concerns   Therapist Response: Patient engaged activity and discussion. Pt able to apply topic to her life.  At check-out, patient rates her mood at a 8 on a scale of 1-10 with 10 being great. Patient reports afternoon plans of taking a nap, packing up some of her apartment, and researching a new pet.  Patient demonstrates some progress as evidenced by increased positivity. Patient denies SI/HI/self-harm at the end of group.      Suicidal/Homicidal: Nowithout intent/plan   Plan: Pt will continue in PHP while working to decrease depression symptoms and increase ability to manage symptoms in a healthy manner as they arise.    Diagnosis: Current severe episode of major depressive disorder without psychotic features without prior episode (HCC) [F32.2]    1. Current severe episode of major depressive disorder without psychotic features without prior episode (HCC)       Julie Guiles, LCSW 04/19/2018

## 2018-04-19 NOTE — Addendum Note (Signed)
Addended by: Oneta Rack on: 04/19/2018 08:48 AM   Modules accepted: Orders

## 2018-04-20 ENCOUNTER — Other Ambulatory Visit (HOSPITAL_COMMUNITY): Payer: 59 | Admitting: Occupational Therapy

## 2018-04-20 ENCOUNTER — Other Ambulatory Visit (HOSPITAL_COMMUNITY): Payer: 59 | Admitting: Licensed Clinical Social Worker

## 2018-04-20 ENCOUNTER — Encounter (HOSPITAL_COMMUNITY): Payer: Self-pay | Admitting: Occupational Therapy

## 2018-04-20 DIAGNOSIS — R4589 Other symptoms and signs involving emotional state: Secondary | ICD-10-CM

## 2018-04-20 DIAGNOSIS — F070 Personality change due to known physiological condition: Secondary | ICD-10-CM

## 2018-04-20 DIAGNOSIS — F329 Major depressive disorder, single episode, unspecified: Secondary | ICD-10-CM | POA: Diagnosis not present

## 2018-04-20 DIAGNOSIS — F332 Major depressive disorder, recurrent severe without psychotic features: Secondary | ICD-10-CM

## 2018-04-20 NOTE — Psych (Signed)
   [facility] BH PHP THERAPIST PROGRESS NOTE  Julie [name] [id number]  Session Time: 9:00 - 11:00  Participation Level: Active  Behavioral Response: CasualAlertDepressed  Type of Therapy: Group Therapy  Treatment Goals addressed: Coping  Interventions: CBT, DBT, Solution Focused, Supportive and Reframing  Summary: Clinician led check-in regarding current stressors and situation, and review of patient completed daily inventory. Clinician utilized active listening and empathetic response and validated patient emotions. Clinician facilitated processing group on pertinent issues.   Therapist Response: [name] is a 17 y.o. female who presents with depression symptoms. Patient arrived within time allowed and reports that she is felling sleepy. Patient rates his mood at a 6 on a scale of 1-10 with 10 being great. Pt states that she went to her apartment with her dad and picked up some stuff. Pt. states she believes that her friend is mad at her and is  concerned about a conversation that she is going to have with him Patient to struggle with support system. Patient engaged in discussion.       Session Time: 11:00 -12:15  Participation Level: Active  Behavioral Response: CasualAlertDepressed  Type of Therapy: Group Therapy, psychotherapy  Treatment Goals addressed: Coping  Interventions: Strengths based, reframing, Supportive,   Summary:  Spiritual Care group  Therapist Response: Patient engaged in group. See chaplain note.         Session Time: 12:15 - 1:00  Participation Level: Active  Behavioral Response: CasualAlertDepressed  Type of Therapy: Group Therapy  Treatment Goals addressed: Coping  Interventions: Psychologist, occupational, Supportive  Summary:  Reflection Group: Patients encouraged to practice skills and interpersonal techniques or work on mindfulness and relaxation techniques. The importance of self-care and making skills part of a routine to increase  usage were stressed   Therapist Response: Patient engaged and participated appropriately.        Session Time: 1:00- 2:00  Participation Level: Active  Behavioral Response: CasualAlertDepressed  Type of Therapy: Group Therapy, Psychoeducation  Treatment Goals addressed: Coping  Interventions: relaxation training; Supportive; Reframing  Summary: 12:45 - 1:50: Relaxation group: Cln led group focused on retraining the body's response to stress.   1:50 -2:00 Clinician led check-out. Clinician assessed for immediate needs, medication compliance and efficacy, and safety concerns   Therapist Response: Patient engaged in activity and discussion. At check-out, patient rates her mood at a 6 on a scale of 1-10 with 10 being great. Patient reports afternoon plans of continuing to clean out her apartment. Patient demonstrates some progress as evidenced by increased sharing. Patient denies SI/HI/self-harm thoughts at the end of group.        Suicidal/Homicidal: Nowithout intent/plan   Plan: Pt will continue in PHP while working to decrease depression symptoms and increase ability to manage symptoms in a healthy manner as they arise.    Diagnosis: Severe episode of recurrent major depressive disorder, without psychotic features (HCC) [F33.2]    1. Severe episode of recurrent major depressive disorder, without psychotic features (HCC)       Donia Guiles, LCSW 04/20/2018

## 2018-04-20 NOTE — Therapy (Signed)
[facility] PARTIAL HOSPITALIZATION PROGRAM [address] [place], [place], [address] Phone: [phone]   Fax:  [phone]  Occupational Therapy Treatment  Patient Details  Name: EISA CONAWAY MRN: [id number] Date of Birth: November 23, 2000 Referring Provider (OT): Hillery Jacks, NP   Encounter Date: 04/20/2018  OT End of Session - 04/20/18 1238    Visit Number  2    Number of Visits  16    Date for OT Re-Evaluation  05/16/18    Authorization Type  [employer]    OT Start Time  1100    OT Stop Time  1200    OT Time Calculation (min)  60 min    Activity Tolerance  Patient tolerated treatment well    Behavior During Therapy  [facility] for tasks assessed/performed       Past Medical History:  Diagnosis Date  . CMV (cytomegalovirus infection) (HCC) 04/13/2018  . Dysmenorrhea   . Painful menstrual periods     Past Surgical History:  Procedure Laterality Date  . TONSILLECTOMY      There were no vitals filed for this visit.  Subjective Assessment - 04/20/18 1238    Currently in Pain?  No/denies        S: "I have low self esteem"  O: Education given on definition and importance of positive self-esteem in daily life and relationships with focus on using positive self-talk. Further education given on the relationship between self-esteem and mental illness and both high and low self-esteem factors. Worksheet given for pt to identify a positive trait about themselves with each letter of the alphabet to use as reference for future times of need and to gain insight on numerous positive qualities. Pt asked to share activity with group members.  A: Pt presents to group with flat affect, engaged and participatory throughout session. Pt minimally engaged in discussion of self esteem education, but appeared to be active listening. A-Z activity completed with minimal VC's, pt sharing at end of session with notable improvement in affect. Will need further instruction and  implementation of self esteem education for continued improvement.  P: Pt provided with self-esteem boosting skills to implement into a variety of daily activities/routines. OT will continue to follow up for successful implementation into daily life.                      OT Education - 04/20/18 1238    Education Details  education given on self esteem     Person(s) Educated  Patient    Methods  Explanation;Handout    Comprehension  Verbalized understanding       OT Short Term Goals - 04/20/18 1239      OT SHORT TERM GOAL #1   Title  Pt will apply psychosocial skills and coping mechanisms to daily activities in order to function independently and reintegrate into community dwelling    Time  4    Period  Weeks    Status  On-going    Target Date  05/16/18      OT SHORT TERM GOAL #2   Title  Pt will be educated on strategies to improve psychosocial skills needed to participate fully in all daily, work, and leisure activities    Time  4    Period  Weeks    Status  On-going    Target Date  05/16/18      OT SHORT TERM GOAL #3   Title  Pt will recall and apply  1-3 sleep hygiene strategies to improve performance in BADL routine    Time  4    Period  Weeks    Status  On-going    Target Date  05/16/18      OT SHORT TERM GOAL #4   Title  Pt will create and apply BADL routine to improve quality and frequency of personal hygiene    Time  4    Period  Weeks    Status  On-going    Target Date  05/16/18               Plan - 04/20/18 1238    Occupational performance deficits (Please refer to evaluation for details):  ADL's;IADL's;Rest and Sleep;Education;Work;Leisure;Social Participation       Patient will benefit from skilled therapeutic intervention in order to improve the following deficits and impairments:  Decreased coping skills, Decreased psychosocial skills, Other (comment)(decreased ability to engage in BADL and reintegrte into community)  Visit  Diagnosis: Organic personality disorder  Difficulty coping    Problem List Patient Active Problem List   Diagnosis Date Noted  . MDD (major depressive disorder), recurrent episode, severe (HCC) 04/11/2018   Dalphine Handing, MSOT, OTR/L Behavioral Health OT/ Acute Relief OT PHP Office: [phone]  Dalphine Handing 04/20/2018, 12:40 PM  [facility] HOSPITALIZATION PROGRAM [address] [place], [place], [address] Phone: [phone]   Fax:  [phone]  Name: CLEATUS GOODIN MRN: [id number] Date of Birth: 08-30-2000

## 2018-04-21 ENCOUNTER — Other Ambulatory Visit (HOSPITAL_COMMUNITY): Payer: 59 | Attending: Psychiatry | Admitting: Licensed Clinical Social Worker

## 2018-04-21 ENCOUNTER — Encounter (HOSPITAL_COMMUNITY): Payer: Self-pay | Admitting: Occupational Therapy

## 2018-04-21 ENCOUNTER — Other Ambulatory Visit (HOSPITAL_COMMUNITY): Payer: 59 | Admitting: Occupational Therapy

## 2018-04-21 DIAGNOSIS — R45851 Suicidal ideations: Secondary | ICD-10-CM | POA: Diagnosis not present

## 2018-04-21 DIAGNOSIS — F070 Personality change due to known physiological condition: Secondary | ICD-10-CM

## 2018-04-21 DIAGNOSIS — R4589 Other symptoms and signs involving emotional state: Secondary | ICD-10-CM

## 2018-04-21 DIAGNOSIS — F332 Major depressive disorder, recurrent severe without psychotic features: Secondary | ICD-10-CM

## 2018-04-21 DIAGNOSIS — F329 Major depressive disorder, single episode, unspecified: Secondary | ICD-10-CM | POA: Insufficient documentation

## 2018-04-21 NOTE — Progress Notes (Signed)
GROUP NOTE - spiritual care group 04/19/2018 11:00 - 12:00 ?Facilitated by Wilkie Aye, MDiv, BCC.  ? Group focused on topic of strength. ?Group members reflected on what thoughts and feelings emerge when they hear this topic. ?They then engaged in facilitated dialog around how strength is present in their lives. This dialog focused on representing what strength had been to them in their lives (images and patterns given) and what they saw as helpful in their life now (what they needed / wanted). ?  ?  Activity drew on narrative framework.   Julie [name] was present throughout group.  Engaged in conversation when invited by facilitator, she presented with blunted affect.  [name] expressed agreement with conversation and stated it took strength for her to express how she was feeling.  Described having to "put on face" like everything was ok for her family.  She did not explore this dynamic with group.    Burnis Kingfisher, MDiv, [facility]

## 2018-04-21 NOTE — Therapy (Addendum)
[facility] PARTIAL HOSPITALIZATION PROGRAM [address] [place], [place], [address] Phone: [phone]   Fax:  [phone]  Occupational Therapy Treatment  Patient Details  Name: Julie [name] MRN: [id number] Date of Birth: 2000/09/27 Referring Provider (OT): Hillery Jacks, NP   Encounter Date: 04/21/2018  OT End of Session - 04/21/18 1231    Visit Number  3    Number of Visits  16    Date for OT Re-Evaluation  05/16/18    Authorization Type  UHC    OT Start Time  1100    OT Stop Time  1200    OT Time Calculation (min)  60 min    Activity Tolerance  Patient tolerated treatment well    Behavior During Therapy  [facility] for tasks assessed/performed       Past Medical History:  Diagnosis Date  . CMV (cytomegalovirus infection) (HCC) 04/13/2018  . Dysmenorrhea   . Painful menstrual periods     Past Surgical History:  Procedure Laterality Date  . TONSILLECTOMY      There were no vitals filed for this visit.  Subjective Assessment - 04/21/18 1230    Currently in Pain?  No/denies        S: "Some of my strengths are creativity and kindness"   O: Education given on the importance of utilizing personal strengths to the advantage in life. Pt to circle from a list of strengths they believe to possess, with the opportunity to write their own strengths. Pt then to discuss how strengths have been an advantage in past situations, and how different strengths can be of benefit in the future. This past vs future model was applied to explore relationships, professional life, and personal fulfillment. Pt encouraged to share throughout session and reflect with other group members. Self esteem tree completed with partner to help practice empathy and receiving compliments from others.   A: Pt presents to group with flat affect, occasionally tearful throughout session but stating that they are "happy tears". Pt affect very incongruent with claimed mood. Pt identified  strengths as "kindness and patience", which benefit her in her past workplace. She shares that she used to work at a [employer] and did not always agree with her coworkers and she did not realize, but her strengths of kindness and patience helped facilitate her workflow. Pt self esteem tree completed with much effort and color.  P: OT will continue to follow up to ensure knowledge of strengths is applied affectively when developing psychosocial skills for community reintegration.                    OT Education - 04/21/18 1230    Education Details  continued education given on self esteem    Person(s) Educated  Patient    Methods  Explanation;Handout    Comprehension  Verbalized understanding       OT Short Term Goals - 04/20/18 1239      OT SHORT TERM GOAL #1   Title  Pt will apply psychosocial skills and coping mechanisms to daily activities in order to function independently and reintegrate into community dwelling    Time  4    Period  Weeks    Status  On-going    Target Date  05/16/18      OT SHORT TERM GOAL #2   Title  Pt will be educated on strategies to improve psychosocial skills needed to participate fully in all daily, work, and  leisure activities    Time  4    Period  Weeks    Status  On-going    Target Date  05/16/18      OT SHORT TERM GOAL #3   Title  Pt will recall and apply 1-3 sleep hygiene strategies to improve performance in BADL routine    Time  4    Period  Weeks    Status  On-going    Target Date  05/16/18      OT SHORT TERM GOAL #4   Title  Pt will create and apply BADL routine to improve quality and frequency of personal hygiene    Time  4    Period  Weeks    Status  On-going    Target Date  05/16/18               Plan - 04/21/18 1231    Occupational performance deficits (Please refer to evaluation for details):  ADL's;IADL's;Rest and Sleep;Education;Work;Leisure;Social Participation       Patient will benefit  from skilled therapeutic intervention in order to improve the following deficits and impairments:  Decreased coping skills, Decreased psychosocial skills, Other (comment)(decreased ability to engage in BADL and reintegrate into community)  Visit Diagnosis: Organic personality disorder  Difficulty coping    Problem List Patient Active Problem List   Diagnosis Date Noted  . MDD (major depressive disorder), recurrent episode, severe (HCC) 04/11/2018   Dalphine Handing, MSOT, OTR/L Behavioral Health OT/ Acute Relief OT PHP Office: [phone]  Dalphine Handing 04/21/2018, 12:32 PM  [facility] PARTIAL HOSPITALIZATION PROGRAM [address] [place], [place], [address] Phone: [phone]   Fax:  [phone]  Name: Julie [name] MRN: [id number] Date of Birth: 2000-12-28

## 2018-04-24 ENCOUNTER — Encounter (HOSPITAL_COMMUNITY): Payer: Self-pay | Admitting: Occupational Therapy

## 2018-04-24 ENCOUNTER — Other Ambulatory Visit (HOSPITAL_COMMUNITY): Payer: 59 | Admitting: Licensed Clinical Social Worker

## 2018-04-24 ENCOUNTER — Other Ambulatory Visit (HOSPITAL_COMMUNITY): Payer: 59 | Admitting: Occupational Therapy

## 2018-04-24 DIAGNOSIS — R4589 Other symptoms and signs involving emotional state: Secondary | ICD-10-CM

## 2018-04-24 DIAGNOSIS — F329 Major depressive disorder, single episode, unspecified: Secondary | ICD-10-CM | POA: Diagnosis not present

## 2018-04-24 DIAGNOSIS — F332 Major depressive disorder, recurrent severe without psychotic features: Secondary | ICD-10-CM

## 2018-04-24 DIAGNOSIS — F070 Personality change due to known physiological condition: Secondary | ICD-10-CM

## 2018-04-24 NOTE — Therapy (Signed)
[facility] PARTIAL HOSPITALIZATION PROGRAM [address] [place], [place], [address] Phone: [phone]   Fax:  [phone]  Occupational Therapy Treatment  Patient Details  Name: PARA COSSEY MRN: [id number] Date of Birth: 2000/11/01 Referring Provider (OT): Hillery Jacks, NP   Encounter Date: 04/24/2018  OT End of Session - 04/24/18 1334    Visit Number  4    Number of Visits  16    Date for OT Re-Evaluation  05/16/18    Authorization Type  UHC    OT Start Time  1110    OT Stop Time  1215    OT Time Calculation (min)  65 min    Activity Tolerance  Patient tolerated treatment well    Behavior During Therapy  [facility] for tasks assessed/performed       Past Medical History:  Diagnosis Date  . CMV (cytomegalovirus infection) (HCC) 04/13/2018  . Dysmenorrhea   . Painful menstrual periods     Past Surgical History:  Procedure Laterality Date  . TONSILLECTOMY      There were no vitals filed for this visit.  Subjective Assessment - 04/24/18 1334    Currently in Pain?  No/denies       S: "I need to improve myself in work/education"   O: Education and focus of session on accountability with goals/values setting. Definition and explanation given about accountability. Education further given on self-accountability being in line with personal values and goals to maintain occupational balance in various community settings. Pt given goal identifying worksheet to list immediate, short term, medium term, and long-term goals using a SMART goal framework (specificity, meaningful, adaptive, realistic, and time bound). Goals created as guideline for pt to practice being accountable in various situations. Pt completed work sheet of goals and encouraged to share goals with the group, with emphasis on immediate goal for check in with pt for next session to maintain accountability.   A: Pt presents to group with blunted affect, engaged and participatory throughout  entirety of session. Pt engaged in verbal discussion of definitions of self-accountability with personal examples. Pt completed goals work sheet using SMART goal framework, while remaining in line with values for promotion of occupational balance to help foster continuous practice of accountability skills. Pt identified that she is going to search for part time jobs to manage in the meantime, and plans to long term return to school.  P: Pt provided with education on improving accountability. OT will continue to follow up with accountability skills for successful implementation into daily life.                   OT Education - 04/24/18 1334    Education Details  education given on goals/value setting    Person(s) Educated  Patient    Methods  Explanation;Handout    Comprehension  Verbalized understanding       OT Short Term Goals - 04/20/18 1239      OT SHORT TERM GOAL #1   Title  Pt will apply psychosocial skills and coping mechanisms to daily activities in order to function independently and reintegrate into community dwelling    Time  4    Period  Weeks    Status  On-going    Target Date  05/16/18      OT SHORT TERM GOAL #2   Title  Pt will be educated on strategies to improve psychosocial skills needed to participate fully in all daily, work, and leisure activities  Time  4    Period  Weeks    Status  On-going    Target Date  05/16/18      OT SHORT TERM GOAL #3   Title  Pt will recall and apply 1-3 sleep hygiene strategies to improve performance in BADL routine    Time  4    Period  Weeks    Status  On-going    Target Date  05/16/18      OT SHORT TERM GOAL #4   Title  Pt will create and apply BADL routine to improve quality and frequency of personal hygiene    Time  4    Period  Weeks    Status  On-going    Target Date  05/16/18               Plan - 04/24/18 1335    Occupational performance deficits (Please refer to evaluation for details):   ADL's;IADL's;Rest and Sleep;Education;Work;Leisure;Social Participation       Patient will benefit from skilled therapeutic intervention in order to improve the following deficits and impairments:  Decreased coping skills, Decreased psychosocial skills, Other (comment)(decreased ability ot engage in BADL and reintegrate into community)  Visit Diagnosis: Organic personality disorder  Difficulty coping    Problem List Patient Active Problem List   Diagnosis Date Noted  . MDD (major depressive disorder), recurrent episode, severe (HCC) 04/11/2018   Dalphine Handing, MSOT, OTR/L Behavioral Health OT/ Acute Relief OT PHP Office: [phone]  Dalphine Handing 04/24/2018, 1:37 PM  [facility] PARTIAL HOSPITALIZATION PROGRAM [address] [place], [place], [address] Phone: [phone]   Fax:  [phone]  Name: SAMEERA BETTON MRN: [id number] Date of Birth: 06-14-2001

## 2018-04-25 ENCOUNTER — Ambulatory Visit (HOSPITAL_COMMUNITY): Payer: Self-pay | Admitting: Licensed Clinical Social Worker

## 2018-04-25 ENCOUNTER — Encounter (HOSPITAL_COMMUNITY): Payer: Self-pay | Admitting: Occupational Therapy

## 2018-04-25 ENCOUNTER — Ambulatory Visit (HOSPITAL_COMMUNITY): Payer: Self-pay | Admitting: Occupational Therapy

## 2018-04-25 DIAGNOSIS — F329 Major depressive disorder, single episode, unspecified: Secondary | ICD-10-CM | POA: Diagnosis not present

## 2018-04-25 NOTE — Therapy (Signed)
[facility] PARTIAL HOSPITALIZATION PROGRAM [address] [place], [place], [address] Phone: [phone]   Fax:  [phone]  Occupational Therapy Treatment  Patient Details  Name: TEMISHA MURLEY MRN: [id number] Date of Birth: March 29, 2001 Referring Provider (OT): Hillery Jacks, NP   Encounter Date: 04/25/2018  OT End of Session - 04/25/18 1247    Visit Number  5    Number of Visits  16    Date for OT Re-Evaluation  05/16/18    Authorization Type  UHC    OT Start Time  1100    OT Stop Time  1200    OT Time Calculation (min)  60 min    Activity Tolerance  Patient tolerated treatment well    Behavior During Therapy  [facility] for tasks assessed/performed       Past Medical History:  Diagnosis Date  . CMV (cytomegalovirus infection) (HCC) 04/13/2018  . Dysmenorrhea   . Painful menstrual periods     Past Surgical History:  Procedure Laterality Date  . TONSILLECTOMY      There were no vitals filed for this visit.  Subjective Assessment - 04/25/18 1246    Currently in Pain?  No/denies       S: "I am 0/10 assertive"  O: Education given on assertiveness as it applies to conversation and context when reintegrating into the community. Education packet given on passive, aggressive, passive aggressive, and assertiveness communication. Additional education given on strategies to say "no". Pt encouraged to choose assertiveness mentor" that they can model in daily life. Role playing situations given for entire group to practice with partners with situations of health care providers, friends, work, and community members. Discussion facilitated after each role play.  A: Pt presents to group with blunted affect, engaged and participatory throughout session. Pt identifying that she is more passive and passive aggressive than she is assertive at baseline. Pt chose previous boss to help model assertive behavior in the future. Pt engaged in role play needing significant  VC's to implement assertive qualities. Pt still not showing proficient understanding and implementation at end of session, will need continued follow up for successful implementation after d/c.  P: Education given on assertiveness, OT will follow up in next treatment session to ensure implementation to daily life.                    OT Education - 04/25/18 1246    Education Details  education given on assertiveness communication as it applies to community    Person(s) Educated  Patient    Methods  Explanation;Handout    Comprehension  Verbalized understanding       OT Short Term Goals - 04/20/18 1239      OT SHORT TERM GOAL #1   Title  Pt will apply psychosocial skills and coping mechanisms to daily activities in order to function independently and reintegrate into community dwelling    Time  4    Period  Weeks    Status  On-going    Target Date  05/16/18      OT SHORT TERM GOAL #2   Title  Pt will be educated on strategies to improve psychosocial skills needed to participate fully in all daily, work, and leisure activities    Time  4    Period  Weeks    Status  On-going    Target Date  05/16/18      OT SHORT TERM GOAL #3   Title  Pt will recall and apply 1-3 sleep hygiene strategies to improve performance in BADL routine    Time  4    Period  Weeks    Status  On-going    Target Date  05/16/18      OT SHORT TERM GOAL #4   Title  Pt will create and apply BADL routine to improve quality and frequency of personal hygiene    Time  4    Period  Weeks    Status  On-going    Target Date  05/16/18               Plan - 04/25/18 1247    Occupational performance deficits (Please refer to evaluation for details):  ADL's;IADL's;Rest and Sleep;Education;Work;Leisure;Social Participation       Patient will benefit from skilled therapeutic intervention in order to improve the following deficits and impairments:  Decreased coping skills, Decreased  psychosocial skills, Other (comment)(decreased ability to engage in BADL and reintegrate into community)  Visit Diagnosis: Organic personality disorder  Difficulty coping    Problem List Patient Active Problem List   Diagnosis Date Noted  . MDD (major depressive disorder), recurrent episode, severe (HCC) 04/11/2018   Dalphine Handing, MSOT, OTR/L Behavioral Health OT/ Acute Relief OT PHP Office: [phone]  Dalphine Handing 04/25/2018, 12:49 PM  [facility] PARTIAL HOSPITALIZATION PROGRAM [address] [place], [place], [address] Phone: [phone]   Fax:  [phone]  Name: ELISAMA THISSEN MRN: [id number] Date of Birth: 2000-11-09

## 2018-04-26 ENCOUNTER — Other Ambulatory Visit (HOSPITAL_COMMUNITY): Payer: 59 | Admitting: Licensed Clinical Social Worker

## 2018-04-26 ENCOUNTER — Encounter (HOSPITAL_COMMUNITY): Payer: Self-pay | Admitting: Family

## 2018-04-26 DIAGNOSIS — F329 Major depressive disorder, single episode, unspecified: Secondary | ICD-10-CM | POA: Diagnosis not present

## 2018-04-26 DIAGNOSIS — F332 Major depressive disorder, recurrent severe without psychotic features: Secondary | ICD-10-CM

## 2018-04-26 NOTE — Psych (Signed)
   [facility] BH PHP THERAPIST PROGRESS NOTE  Julie [name] [id number]  Session Time: 9:00 - 10:15  Participation Level: Active  Behavioral Response: CasualAlertDepressed  Type of Therapy: Group Therapy  Treatment Goals addressed: Coping  Interventions: CBT, DBT, Solution Focused, Supportive and Reframing  Summary: Clinician led check-in regarding current stressors and situation, and review of patient completed daily inventory. Clinician utilized active listening and empathetic response and validated patient emotions. Clinician facilitated processing group on pertinent issues.   Therapist Response: Julie [name] is a 17 y.o. female who presents with depression symptoms. Patient arrived within time allowed and reports that she is feeling "normal." Patient rates her mood at a 6 on a scale of 1-10 with 10 being great. Pt states she did not see friends as planned yesterday and instead stayed in with her dad. Patient continues to struggle with sharing in group and isolation. Patient engaged in discussion.       Session Time: 10:15 - 11:00  Participation Level: Active  Behavioral Response: CasualAlertDepressed  Type of Therapy: Group Therapy, Psychotherapy  Treatment Goals addressed: Coping  Interventions: CBT, Solution focused, Supportive, Reframing  Summary:  Clinician led discussion on how discipline and effort factor into recovery.       Therapist Response: Pt reports feeling it is difficult to put forth effort and keep routine while depressed and not being able to then reinforces feelings of worthlessness. Pt is able to identify ways in which she is making efforts daily with help from group.         Session Time: 11:00 -12:00   Participation Level: Active   Behavioral Response: CasualAlertDepressed   Type of Therapy: Group Therapy, OT   Treatment Goals addressed: Coping   Interventions: Psychosocial skills training, Supportive,    Summary:  Occupational Therapy  group   Therapist Response: Patient engaged in group. See OT note.            Session Time: 12:00- 1:00  Participation Level: Active  Behavioral Response: CasualAlertDepressed  Type of Therapy: Group Therapy, Psychoeducation; Psychotherapy  Treatment Goals addressed: Coping  Interventions: CBT; Solution focused; Supportive; Reframing  Summary: 12:00 - 12:50 Group watched "On Being Wrong" TedTalk and connected it to cognitive distortions and the power of reframing perception.  12:50 -1:00 Clinician led check-out. Clinician assessed for immediate needs, medication compliance and efficacy, and safety concerns   Therapist Response:  Pt engaged in discussion on perception shifts and is able to identify an area in which doing this would improve her outlook.  At check-out, patient rates her mood at a 8 on a scale of 1-10 with 10 being great. Patient reports afternoon plans of seeing a friend and continuing to move out of her apartment throughout the weekend. Patient demonstrates some progress as evidenced by mood increase throughout session, however it seems that while pt gathers strength from group, she is not applying that strength outside of group and quickly devolves at home. Patient denies SI/HI/self-harm at the end of group.     Suicidal/Homicidal: Nowithout intent/plan   Plan: Pt will continue in PHP while working to decrease depression symptoms and increase ability to manage symptoms in a healthy manner as they arise.    Diagnosis: Severe episode of recurrent major depressive disorder, without psychotic features (HCC) [F33.2]    1. Severe episode of recurrent major depressive disorder, without psychotic features (HCC)       Donia Guiles, LCSW 04/26/2018

## 2018-04-26 NOTE — Progress Notes (Signed)
BH MD/PA/NP OP Progress Note  04/26/2018 8:26 AM Julie [name]  MRN:  [id number]   Rogenia seen attending group sessions. Patient presents with a flat and guarded affect during this assessment. Patient is currently denying suicidal or homicidal ideations. Report she has been working on ways to build "stronger" relationships between family members and friends. Hoda is denying hopelessness or mood irritably.  Reports her father continues to be very supportive.   Patient reports taken medications as prescribed and tolerating medications well. Reports  " I haven't noticed a differences" as she states she just started Lexapro 1 week prior. NP will continue to monitor and will consider titration next week. Patient is requesting medication refill to trazodone 50 mg as she reports " my father has been taken my medication to help him sleep". Education was provided with sharing and taken other's medications. Np will not refill this medication at this time as patient reports she doesn't  use the medication for insomnia. States she is resting well throughout the night and reports a fair appetite  . Patient continues to express concerns with unintentional weight loss. However reports she is followed by her Primary Care Provider for additional screening and testing. Support, encouragement and reassurance was provided.   History:  Per patient inpatient note: Patient is a 17 year old female with essentially negative past psychiatric history who presented to the behavioral health hospital as a walk-in. Patient stated that she had been increasingly more depressed over the last several weeks, and began to think about killing herself. She stated she was thinking about jumping off the back daily at her apartment. She stated that it started approximately 2 weeks ago. She is been having difficulty with nausea and vomiting. They have been unable to come up with a cause for this. She stated because she had been ill she had to  drop out of college, and that bother her a great deal. She admitted to helplessness, hopelessness and worthlessness. She she admitted to crying spells. She was admitted to the hospital for evaluation and stabilization.   Visit Diagnosis:    ICD-10-CM   1. Current severe episode of major depressive disorder without psychotic features without prior episode (HCC) F32.2     Past Psychiatric History:   Past Medical History:  Past Medical History:  Diagnosis Date  . CMV (cytomegalovirus infection) (HCC) 04/13/2018  . Dysmenorrhea   . Painful menstrual periods     Past Surgical History:  Procedure Laterality Date  . TONSILLECTOMY      Family Psychiatric History:   Family History:  Family History  Problem Relation Age of Onset  . Hypertension Mother   . Chronic Renal Failure Father   . Depression Father   . Alcohol abuse Maternal Uncle   . Depression Paternal Uncle   . Dementia Maternal Grandmother   . Depression Paternal Grandfather   . Depression Paternal Grandmother     Social History:  Social History   Socioeconomic History  . Marital status: Single    Spouse name: Not on file  . Number of children: Not on file  . Years of education: Not on file  . Highest education level: Not on file  Occupational History  . Not on file  Social Needs  . Financial resource strain: Not hard at all  . Food insecurity:    Worry: Often true    Inability: Not on file  . Transportation needs:    Medical: No    Non-medical: No  Tobacco Use  .  Smoking status: Never Smoker  . Smokeless tobacco: Never Used  Substance and Sexual Activity  . Alcohol use: Never    Frequency: Never  . Drug use: Not Currently    Types: Marijuana    Comment: Has done this twice and last time 1 week prior  . Sexual activity: Not Currently    Partners: Male    Birth control/protection: None  Lifestyle  . Physical activity:    Days per week: 0 days    Minutes per session: Not on file  . Stress: To  some extent  Relationships  . Social connections:    Talks on phone: More than three times a week    Gets together: Once a week    Attends religious service: Never    Active member of club or organization: No    Attends meetings of clubs or organizations: Never    Relationship status: Never married  Other Topics Concern  . Not on file  Social History Narrative   ** Merged History Encounter **        Allergies: No Known Allergies  Metabolic Disorder Labs: No results found for: HGBA1C, MPG Lab Results  Component Value Date   PROLACTIN 38.2 (H) 04/11/2018   Lab Results  Component Value Date   CHOL 177 04/11/2018   TRIG 107 04/11/2018   HDL 40 (L) 04/11/2018   CHOLHDL 4.4 04/11/2018   VLDL 21 04/11/2018   LDLCALC 116 (H) 04/11/2018   Lab Results  Component Value Date   TSH 2.682 04/11/2018    Therapeutic Level Labs: No results found for: LITHIUM No results found for: VALPROATE No components found for:  CBMZ  Current Medications: Current Outpatient Medications  Medication Sig Dispense Refill  . escitalopram (LEXAPRO) 5 MG tablet Take 2 tablets (10 mg total) by mouth daily. For depression 60 tablet 0  . hydrOXYzine (ATARAX/VISTARIL) 25 MG tablet Take 1 tablet (25 mg total) by mouth every 6 (six) hours as needed for anxiety. 60 tablet 0  . Norgestimate-Ethinyl Estradiol Triphasic (TRI-SPRINTEC) 0.18/0.215/0.25 MG-35 MCG tablet Take 1 tablet by mouth at bedtime. Birth control method 1 Package 11  . sulfamethoxazole-trimethoprim (BACTRIM,SEPTRA) 400-80 MG tablet Take 1 tablet by mouth every 12 (twelve) hours. For urinary tract infection. 13 tablet 0  . traZODone (DESYREL) 50 MG tablet Take 0.5 tablets (25 mg total) by mouth at bedtime as needed for sleep. 30 tablet 0   No current facility-administered medications for this visit.      Musculoskeletal: Strength & Muscle Tone: within normal limits Gait & Station: normal Patient leans: N/A  Psychiatric Specialty  Exam: ROS  There were no vitals taken for this visit.There is no height or weight on file to calculate BMI.  General Appearance: Casual and Guarded and flat affective    Eye Contact:  Good  Speech:  Clear and Coherent  Volume:  Decreased  Mood:  Depressed  Affect:  Congruent and Flat  Thought Process:  Coherent  Orientation:  Full (Time, Place, and Person)  Thought Content: Hallucinations: None   Suicidal Thoughts:  No  Homicidal Thoughts:  No  Memory:  Immediate;   Fair Recent;   Fair Remote;   Fair  Judgement:  Fair  Insight:  Fair  Psychomotor Activity:  Normal  Concentration:  Concentration: Fair  Recall:  [employer] of Knowledge: Fair  Language: Fair  Akathisia:  No  Handed:  Right  AIMS (if indicated):  Assets:  Communication Skills Resilience Social Support  ADL's:  Intact  Cognition: WNL  Sleep:  Fair   Screenings: AIMS     Ad[facility] (Discharged) [facility]  AIMS Total Score  0    AUDIT     Admission (Discharged) from [facility]  Alcohol Use Disorder Identification Test Final Score (AUDIT)  0    GAD-7     Counselor from 04/19/2018 [facility]  Total GAD-7 Score  9    PHQ2-9     Counselor from 04/18/2018 in [facility] Most recent reading at 04/18/2018 12:51 PM Counselor from 04/19/2018 in [facility] Most recent reading at 04/18/2018  9:00 AM  PHQ-2 Total Score  4  6  PHQ-9 Total Score  15  17       Assessment and Plan:  Continue Partial hospitalization programing   MDD:  Continue Lexapro 10 mg pr Q day ( titration)    Insomnia:   Discontinued  Trazodone 25 mg-50mg  Po QHS  as patient reports she doesn't take this medications. "reports I may need a refill, because my father is taken this medication."     Treatment plan was reviewed and agreed upon by N.P T.Denson Niccoli and patient Soley Kleist need for continued  group services    Oneta Rack, NP 04/26/2018, 8:26 AM

## 2018-04-26 NOTE — Progress Notes (Signed)
Spiritual care group 04/26/2018 11:00-12:00  Facilitated by Wilkie Aye, MDiv    Group focused on topic of "self-care"  Patients engaged in facilitated discussion about topic.  Explored quotes related to self care and chose one which they agreed with and one which they disliked.  Engaged in discussion around quote choices and their experience / understanding of care for themselves.    Julie [name] was present throughout group, presented with flat affect, which brightened when engaged by facilitator. Discussed her quotes with facilitator prompting.  In discussing her quotes, made jokes about quotes and laughed with group and was appropriately tearful..    Stated she needed something concrete from self care and did not resonate with a quote was abstract.  She processed a quote which stated "you, yourself, are worthy of love as much as anyone..."   Described longing to offer care to herself in the same way she offers to others.  Stated she had felt moments of worthlessness the night before, but described longing to be in a place where she could feel her own self-worth.  Was tearful congruent with topic.  Engaged with group about "practice" and "journey" in learning to love herself.      [facility] / [facility] Chaplain Burnis Kingfisher, MDiv

## 2018-04-26 NOTE — Psych (Signed)
   [facility] PHP THERAPIST PROGRESS NOTE  [name] [id number]  Session Time: 9:00 - 11:00  Participation Level: Active  Behavioral Response: CasualAlertDepressed  Type of Therapy: Group Therapy  Treatment Goals addressed: Coping  Interventions: CBT, DBT, Solution Focused, Supportive and Reframing  Summary: Clinician led check-in regarding current stressors and situation, and review of patient completed daily inventory. Clinician utilized active listening and empathetic response and validated patient emotions. Clinician facilitated processing group on pertinent issues.   Therapist Response: Julie [name] is a 17 y.o. female who presents with depression symptoms. Patient arrived within time allowed and reports that she is feeling "better." Pt. states that ate breakfast which she hasn't done in a long time. Patient rates her mood at a 8 on a scale of 1-10 with 10 being great. Pt states that she cleaned out her apartment, sang for the first time in a long time and danced while cleaning. Pt. states that she spoke with friend and had a good conversation. Patient continues to struggle with accepting help from friends. Patient engaged in discussion.       Session Time: 11:00 -12:00  Participation Level: Active  Behavioral Response: CasualAlertDepressed  Type of Therapy: Group Therapy, psychotherapy  Treatment Goals addressed: Coping  Interventions: Psychosocial skills training, Supportive  Summary:  Occupational therapy group  Therapist Response: Patient engaged in group. See OT note.         Session Time: 12:00 - 12:45  Participation Level: Active  Behavioral Response: CasualAlertDepressed  Type of Therapy: Group Therapy  Treatment Goals addressed: Coping  Interventions: Psychologist, occupational, Supportive  Summary:  Reflection Group: Patients encouraged to practice skills and interpersonal techniques or work on mindfulness and relaxation  techniques. The importance of self-care and making skills part of a routine to increase usage were stressed   Therapist Response: Patient engaged and participated appropriately.        Session Time: 12:45- 2:00  Participation Level: Active  Behavioral Response: CasualAlertDepressed  Type of Therapy: Group Therapy, Psychoeducation; Psychotherapy  Treatment Goals addressed: Coping  Interventions: CBT; Solution focused; Supportive; Reframing  Summary: 12:45 - 1:50 Cln led art therapy group. Group used creativity to depict how their mental health feels on bad days and then how they want to feel in the future. Pt's shared their artwork.  1:50 -2:00 Clinician led check-out. Clinician assessed for immediate needs, medication compliance and efficacy, and safety concerns     Therapist Response: Patient engaged in activity. Pt depicted bad days as being held captive by her giant depression monster. Pt depicted what she wants to feel is that her mind is clearing and she is growing.  At check-out, patient rates her mood at a 8 on a scale of 1-10 with 10 being great. Patient reports that she is going to pick up a costume and go out for Halloween. Patient demonstrates some progress as evidenced by dealing with uncomfortable situations rather than shying away. Patient denies SI/HI/self-harm thoughts at the end of group.       Suicidal/Homicidal: Nowithout intent/plan   Plan: Pt will continue in PHP while working to decrease depression symptoms and increase ability to manage symptoms in a healthy manner as they arise.    Diagnosis: Severe episode of recurrent major depressive disorder, without psychotic features (HCC) [F33.2]    1. Severe episode of recurrent major depressive disorder, without psychotic features (HCC)       Julie Guiles, LCSW 04/26/2018

## 2018-04-27 ENCOUNTER — Other Ambulatory Visit (HOSPITAL_COMMUNITY): Payer: 59 | Admitting: Occupational Therapy

## 2018-04-27 ENCOUNTER — Other Ambulatory Visit: Payer: Self-pay

## 2018-04-27 ENCOUNTER — Encounter (HOSPITAL_COMMUNITY): Payer: Self-pay

## 2018-04-27 ENCOUNTER — Other Ambulatory Visit (HOSPITAL_COMMUNITY): Payer: 59 | Admitting: Licensed Clinical Social Worker

## 2018-04-27 ENCOUNTER — Encounter (HOSPITAL_COMMUNITY): Payer: Self-pay | Admitting: Occupational Therapy

## 2018-04-27 VITALS — BP 141/83 | HR 81 | Resp 16 | Wt 219.0 lb

## 2018-04-27 DIAGNOSIS — R4589 Other symptoms and signs involving emotional state: Secondary | ICD-10-CM

## 2018-04-27 DIAGNOSIS — F329 Major depressive disorder, single episode, unspecified: Secondary | ICD-10-CM | POA: Diagnosis not present

## 2018-04-27 DIAGNOSIS — F070 Personality change due to known physiological condition: Secondary | ICD-10-CM

## 2018-04-27 DIAGNOSIS — F332 Major depressive disorder, recurrent severe without psychotic features: Secondary | ICD-10-CM

## 2018-04-27 NOTE — Progress Notes (Signed)
Patient presents with flat depressed affect, smiles when spoken to. Very soft spoken. On scale of 1-10 as 10 being worst patient rates depression at 3, anxiety at 2, and hopelessness at 3. Had fleeting suicidal thoughts 2 days ago. No plan or intent. States that she was able to remember what she learned in groups and acknowledged that its OK to have thoughts but not to act on them. Feels that this program has been helpful to her and she is learning a lot. Enjoys being around other people and hearing stories and sharing her own. Feels she has learned new ways to cope with depression and anxiety. No A/V hallucinations. Denies HI. Feels her Hydroxyzine makes her sleepy when she takes it so she only takes it when she absolutely needs it. Also feels like one of her medications is causing her to have a decreased appetite but not sure which medications. She states that she has lost 4 lbs since yesterday. She is not concerned about the weight loss but wanted NP to be aware. Very pleasant, and looking forward to continuing the program.

## 2018-04-27 NOTE — Therapy (Signed)
[facility] PARTIAL HOSPITALIZATION PROGRAM [address] [place], [place], [address] Phone: [phone]   Fax:  [phone]  Occupational Therapy Treatment  Patient Details  Name: Julie [name] MRN: [id number] Date of Birth: 24-Feb-2001 Referring Provider (OT): Hillery Jacks, NP   Encounter Date: 04/27/2018  OT End of Session - 04/27/18 1357    Visit Number  6    Number of Visits  16    Date for OT Re-Evaluation  05/16/18    Authorization Type  [employer]    OT Start Time  1100    OT Stop Time  1200    OT Time Calculation (min)  60 min    Activity Tolerance  Patient tolerated treatment well    Behavior During Therapy  [facility] for tasks assessed/performed       Past Medical History:  Diagnosis Date  . CMV (cytomegalovirus infection) (HCC) 04/13/2018  . Dysmenorrhea   . Painful menstrual periods     Past Surgical History:  Procedure Laterality Date  . TONSILLECTOMY      There were no vitals filed for this visit.  Subjective Assessment - 04/27/18 1357    Currently in Pain?  No/denies       S: "I currently am sleeping enough, and it's not interrupted"   O: Pt educated on sleep hygiene as it pertains to daily life/routines this date. Sleep hygiene questionnaire administered to increase insight on current sleep habits to help develop future goals. Education given on appropriate sleep routines, sleep disorders, detriments of too much/too little sleep with encouraged feedback of personal Experiences. Sleep diary handout given to challenge pt to track current habits and identify area for change. Further education given on relaxation techniques to implement before bed. Pt asked to identify one STG in relation to sleep hygiene to create better daily sleep habits.   A: Pt presents to group with flat affect, engaged and participatory throughout entirety of session. Pt completed sleep hygiene questionnaire, identifying current poor sleep habits to improve this date  including: better routine management and caffeine consumption. Pt with insight of current poor sleep hygiene habits with mindset of positive change. Education received in a positive manner to help improve sleep hygiene in daily life. Pt agreeable to use sleep diary this date. Pt identified goal of reducing caffeine and managing her routine more effectively.  P: Pt provided with skills to increase sleep hygiene habits into daily routine. OT will continue to follow up with communication skills for successful implementation into daily life.                     OT Education - 04/27/18 1357    Education Details  education given on sleep hygiene     Person(s) Educated  Patient    Methods  Explanation;Handout    Comprehension  Verbalized understanding       OT Short Term Goals - 04/20/18 1239      OT SHORT TERM GOAL #1   Title  Pt will apply psychosocial skills and coping mechanisms to daily activities in order to function independently and reintegrate into community dwelling    Time  4    Period  Weeks    Status  On-going    Target Date  05/16/18      OT SHORT TERM GOAL #2   Title  Pt will be educated on strategies to improve psychosocial skills needed to participate fully in all daily, work, and leisure activities  Time  4    Period  Weeks    Status  On-going    Target Date  05/16/18      OT SHORT TERM GOAL #3   Title  Pt will recall and apply 1-3 sleep hygiene strategies to improve performance in BADL routine    Time  4    Period  Weeks    Status  On-going    Target Date  05/16/18      OT SHORT TERM GOAL #4   Title  Pt will create and apply BADL routine to improve quality and frequency of personal hygiene    Time  4    Period  Weeks    Status  On-going    Target Date  05/16/18               Plan - 04/27/18 1358    Occupational performance deficits (Please refer to evaluation for details):  ADL's;IADL's;Rest and Sleep;Education;Work;Leisure;Social  Participation       Patient will benefit from skilled therapeutic intervention in order to improve the following deficits and impairments:  Decreased coping skills, Decreased psychosocial skills, Other (comment)(decreased ability to engage in BADL and reintegrate into community)  Visit Diagnosis: Organic personality disorder  Difficulty coping    Problem List Patient Active Problem List   Diagnosis Date Noted  . MDD (major depressive disorder), recurrent episode, severe (HCC) 04/11/2018   Dalphine Handing, MSOT, OTR/L Behavioral Health OT/ Acute Relief OT PHP Office: [phone]  Dalphine Handing 04/27/2018, 1:59 PM  [facility] PARTIAL HOSPITALIZATION PROGRAM [address] [place], [place], [address] Phone: [phone]   Fax:  [phone]  Name: Julie [name] MRN: [id number] Date of Birth: 11/18/2000

## 2018-04-28 ENCOUNTER — Other Ambulatory Visit (HOSPITAL_COMMUNITY): Payer: 59 | Admitting: Occupational Therapy

## 2018-04-28 ENCOUNTER — Encounter (HOSPITAL_COMMUNITY): Payer: Self-pay

## 2018-04-28 ENCOUNTER — Other Ambulatory Visit (HOSPITAL_COMMUNITY): Payer: 59 | Admitting: Licensed Clinical Social Worker

## 2018-04-28 DIAGNOSIS — F332 Major depressive disorder, recurrent severe without psychotic features: Secondary | ICD-10-CM

## 2018-04-28 DIAGNOSIS — R4589 Other symptoms and signs involving emotional state: Secondary | ICD-10-CM

## 2018-04-28 DIAGNOSIS — F329 Major depressive disorder, single episode, unspecified: Secondary | ICD-10-CM | POA: Diagnosis not present

## 2018-04-28 DIAGNOSIS — F070 Personality change due to known physiological condition: Secondary | ICD-10-CM

## 2018-04-28 NOTE — Therapy (Signed)
[facility] PARTIAL HOSPITALIZATION PROGRAM [address] [place], [place], [address] Phone: [phone]   Fax:  [phone]  Occupational Therapy Treatment  Patient Details  Name: Julie [name] MRN: [id number] Date of Birth: 11/24/2000 Referring Provider (OT): Hillery Jacks, NP   Encounter Date: 04/28/2018  OT End of Session - 04/28/18 1245    Visit Number  7    Number of Visits  16    Date for OT Re-Evaluation  05/16/18    Authorization Type  [employer]    OT Start Time  1100    OT Stop Time  1200    OT Time Calculation (min)  60 min    Activity Tolerance  Patient tolerated treatment well    Behavior During Therapy  [facility] for tasks assessed/performed       Past Medical History:  Diagnosis Date  . CMV (cytomegalovirus infection) (HCC) 04/13/2018  . Dysmenorrhea   . Painful menstrual periods     Past Surgical History:  Procedure Laterality Date  . TONSILLECTOMY      There were no vitals filed for this visit.  Subjective Assessment - 04/28/18 1244    Currently in Pain?  No/denies        S: "I did deep breathing once and it actually made it worse"   O: Education given on relaxation and mindfulness techniques to use in preparation to sleep or in the community when experiencing a heightening of symptoms of anxiety/stress. Handouts given to describe: deep breathing, imagery, progressive muscle relaxation and mindfulness. Guided progressive muscle relaxation given during session for pt follow along to facilitate relaxation response. Definition and explanation given on mindfulness and how ot apply into daily routine as additional coping skill. Pt then engaged in art therapy to make a personal/accessible tool to implement mindfulness at home.  A: Pt presents to treatment with flat affect, engaged and participatory throughout entirety of session. Pt mentions some experience with coping skills instructed upon today, but plans to continue the practice of deep  breathing, progressive muscle relaxation, and mindfulness. Pt in understanding of education given. Education and explanation given on reasons for pt feeling more anxious during deep breathing, encouraged caution to begin again. Pt engaged in PMR script with significant relaxation response. Pt eager to engage in art therapy to create personal means to start practicing mindfulness.  P: OT treatment will be next date. Will continue to follow up with relaxation/mindfulness skills to ensure successful implementation upon community reintegration.                   OT Education - 04/28/18 1244    Education Details  education given on relaxation strategies/mindfulness    Person(s) Educated  Patient    Methods  Explanation;Handout    Comprehension  Verbalized understanding       OT Short Term Goals - 04/20/18 1239      OT SHORT TERM GOAL #1   Title  Pt will apply psychosocial skills and coping mechanisms to daily activities in order to function independently and reintegrate into community dwelling    Time  4    Period  Weeks    Status  On-going    Target Date  05/16/18      OT SHORT TERM GOAL #2   Title  Pt will be educated on strategies to improve psychosocial skills needed to participate fully in all daily, work, and leisure activities    Time  4    Period  Weeks    Status  On-going    Target Date  05/16/18      OT SHORT TERM GOAL #3   Title  Pt will recall and apply 1-3 sleep hygiene strategies to improve performance in BADL routine    Time  4    Period  Weeks    Status  On-going    Target Date  05/16/18      OT SHORT TERM GOAL #4   Title  Pt will create and apply BADL routine to improve quality and frequency of personal hygiene    Time  4    Period  Weeks    Status  On-going    Target Date  05/16/18               Plan - 04/28/18 1245    Occupational performance deficits (Please refer to evaluation for details):  ADL's;IADL's;Rest and  Sleep;Education;Work;Leisure;Social Participation       Patient will benefit from skilled therapeutic intervention in order to improve the following deficits and impairments:  Decreased coping skills, Decreased psychosocial skills, Other (comment)(decreased ability to engage in BADL and reintegrate into community)  Visit Diagnosis: Organic personality disorder  Difficulty coping    Problem List Patient Active Problem List   Diagnosis Date Noted  . MDD (major depressive disorder), recurrent episode, severe (HCC) 04/11/2018   Dalphine Handing, MSOT, OTR/L Behavioral Health OT/ Acute Relief OT PHP Office: [phone]  Dalphine Handing 04/28/2018, 12:47 PM  [facility] PARTIAL HOSPITALIZATION PROGRAM [address] [place], [place], [address] Phone: [phone]   Fax:  [phone]  Name: Julie [name] MRN: [id number] Date of Birth: Feb 03, 2001

## 2018-05-01 ENCOUNTER — Other Ambulatory Visit (HOSPITAL_COMMUNITY): Payer: 59 | Admitting: Occupational Therapy

## 2018-05-01 ENCOUNTER — Other Ambulatory Visit (HOSPITAL_COMMUNITY): Payer: 59 | Admitting: Licensed Clinical Social Worker

## 2018-05-01 ENCOUNTER — Encounter (HOSPITAL_COMMUNITY): Payer: Self-pay | Admitting: Occupational Therapy

## 2018-05-01 DIAGNOSIS — F332 Major depressive disorder, recurrent severe without psychotic features: Secondary | ICD-10-CM

## 2018-05-01 DIAGNOSIS — F070 Personality change due to known physiological condition: Secondary | ICD-10-CM

## 2018-05-01 DIAGNOSIS — R4589 Other symptoms and signs involving emotional state: Secondary | ICD-10-CM

## 2018-05-01 DIAGNOSIS — F329 Major depressive disorder, single episode, unspecified: Secondary | ICD-10-CM | POA: Diagnosis not present

## 2018-05-01 NOTE — Therapy (Signed)
[facility] PARTIAL HOSPITALIZATION PROGRAM [address] [place], [place], [address] Phone: [phone]   Fax:  [phone]  Occupational Therapy Treatment  Patient Details  Name: Julie [name] MRN: [id number] Date of Birth: Mar 21, 2001 Referring Provider (OT): Hillery Jacks, NP   Encounter Date: 05/01/2018  OT End of Session - 05/01/18 1308    Visit Number  8    Number of Visits  16    Date for OT Re-Evaluation  05/16/18    Authorization Type  [employer]    OT Start Time  1100    OT Stop Time  1200    OT Time Calculation (min)  60 min    Activity Tolerance  Patient tolerated treatment well    Behavior During Therapy  [facility] for tasks assessed/performed       Past Medical History:  Diagnosis Date  . CMV (cytomegalovirus infection) (HCC) 04/13/2018  . Dysmenorrhea   . Painful menstrual periods     Past Surgical History:  Procedure Laterality Date  . TONSILLECTOMY      There were no vitals filed for this visit.  Subjective Assessment - 05/01/18 1308    Currently in Pain?  No/denies        S: "I have noticed improvement in these areas"   O: Education given on protective factors and their importance in building resiliency to face difficult life challenges. Protective factors worksheet completed. Pt to rate current protective factors of social support, coping skills, physical health, sense of purpose, self-esteem, and healthy thinking on a scale from weak-moderate-strong. Pt then to identify the most valuable protective factor, 2 protective factors to improve, and specific goals to accomplish this task.   A: Pt presents to group with not congruent affect, engaged and offering examples with other group members and facilitator. Pt completed protective factors worksheet, identifying that she scores moderate-weak on the protective factors. She values social support the most and can reflect on specific times that this was beneficial. Pt is planning to broaden  her sense of purpose by starting her new job and planning to return to school in the future.   P: Education given on protective factors and goal setting using art as therapeutic tool. OT will continue follow up with pt to ensure successful implementation in daily life.                     OT Education - 05/01/18 1308    Education Details  education given on protective factors as it apples to mental health    Person(s) Educated  Patient    Methods  Explanation;Handout    Comprehension  Verbalized understanding       OT Short Term Goals - 04/20/18 1239      OT SHORT TERM GOAL #1   Title  Pt will apply psychosocial skills and coping mechanisms to daily activities in order to function independently and reintegrate into community dwelling    Time  4    Period  Weeks    Status  On-going    Target Date  05/16/18      OT SHORT TERM GOAL #2   Title  Pt will be educated on strategies to improve psychosocial skills needed to participate fully in all daily, work, and leisure activities    Time  4    Period  Weeks    Status  On-going    Target Date  05/16/18      OT SHORT TERM GOAL #  3   Title  Pt will recall and apply 1-3 sleep hygiene strategies to improve performance in BADL routine    Time  4    Period  Weeks    Status  On-going    Target Date  05/16/18      OT SHORT TERM GOAL #4   Title  Pt will create and apply BADL routine to improve quality and frequency of personal hygiene    Time  4    Period  Weeks    Status  On-going    Target Date  05/16/18               Plan - 05/01/18 1308    Occupational performance deficits (Please refer to evaluation for details):  ADL's;IADL's;Rest and Sleep;Education;Work;Leisure;Social Participation       Patient will benefit from skilled therapeutic intervention in order to improve the following deficits and impairments:  Decreased coping skills, Decreased psychosocial skills, Other (comment)(decreased ability to  engage in BADL and reintegrate into community)  Visit Diagnosis: Organic personality disorder  Difficulty coping    Problem List Patient Active Problem List   Diagnosis Date Noted  . MDD (major depressive disorder), recurrent episode, severe (HCC) 04/11/2018   Dalphine Handing, MSOT, OTR/L Behavioral Health OT/ Acute Relief OT PHP Office: [phone]  Dalphine Handing 05/01/2018, 1:09 PM  [facility] PARTIAL HOSPITALIZATION PROGRAM [address] [place], [place], [address] Phone: [phone]   Fax:  [phone]  Name: Julie [name] MRN: [id number] Date of Birth: 2000-11-08

## 2018-05-02 ENCOUNTER — Other Ambulatory Visit (HOSPITAL_COMMUNITY): Payer: 59 | Admitting: Licensed Clinical Social Worker

## 2018-05-02 ENCOUNTER — Encounter (HOSPITAL_COMMUNITY): Payer: Self-pay | Admitting: Family

## 2018-05-02 ENCOUNTER — Other Ambulatory Visit (HOSPITAL_COMMUNITY): Payer: 59 | Admitting: Occupational Therapy

## 2018-05-02 ENCOUNTER — Encounter (HOSPITAL_COMMUNITY): Payer: Self-pay

## 2018-05-02 DIAGNOSIS — F070 Personality change due to known physiological condition: Secondary | ICD-10-CM

## 2018-05-02 DIAGNOSIS — R4589 Other symptoms and signs involving emotional state: Secondary | ICD-10-CM

## 2018-05-02 DIAGNOSIS — F329 Major depressive disorder, single episode, unspecified: Secondary | ICD-10-CM | POA: Diagnosis not present

## 2018-05-02 DIAGNOSIS — F322 Major depressive disorder, single episode, severe without psychotic features: Secondary | ICD-10-CM

## 2018-05-02 MED ORDER — ESCITALOPRAM OXALATE 10 MG PO TABS: 10.0000 mg | ORAL_TABLET | Freq: Every day | ORAL | 0 refills | Status: DC

## 2018-05-02 NOTE — Therapy (Signed)
[facility] [place], [place], [address] Phone: [phone]   Fax:  [phone]  Occupational Therapy Treatment  Patient Details  Name: Julie [name] MRN: [id number] Date of Birth: 2000-11-15 Referring Provider (OT): Ricky Ala, NP   Encounter Date: 05/02/2018  OT End of Session - 05/02/18 1312    Visit Number  9    Number of Visits  16    Date for OT Re-Evaluation  05/16/18    Authorization Type  [employer]    OT Start Time  1100    OT Stop Time  1200    OT Time Calculation (min)  60 min    Activity Tolerance  Patient tolerated treatment well    Behavior During Therapy  [facility] for tasks assessed/performed       Past Medical History:  Diagnosis Date  . CMV (cytomegalovirus infection) ([place]) 04/13/2018  . Dysmenorrhea   . Painful menstrual periods     Past Surgical History:  Procedure Laterality Date  . TONSILLECTOMY      There were no vitals filed for this visit.  Subjective Assessment - 05/02/18 1311    Currently in Pain?  No/denies       S: "I am low in a lot of areas, except physical"   O: Education given on self care and its importance in regular BADL/IADL routine. Pt completed self care assessment to identify areas of strength and weakness. Self care assessments covered areas of physical health, psychological health, spiritual health, and professional health. Pt asked to identifies area of weakness within each area and develop plans for improvement this date. Pt encouraged to brainstorm with other peers to begin goal setting in areas of desired change.  ?  A: Pt presents to group with not congruent affect, anxious body language, guarded this date. Self care assessment completed, pt stating she identified several areas to improve. She focused mostly on psychological and social categories. Pt shares that she is learning to implement coping skills consistently and how to talk to other people about her  feelings in a healthy way. She shares that her personal hygiene has been slowly improving, pt presents with hair done and mentions improvement at home, which was one of the OT goals in the care plan.   P: Pt educated on importance of self care in BADL/IADL routine. OT will continue to follow up with pt each treatment session to ensure carryover into daily routine to facilitate successful community integration. OT treatment will be 4 times a week                     OT Education - 05/02/18 1312    Education Details  education given on self care    Person(s) Educated  Patient    Methods  Explanation;Handout    Comprehension  Verbalized understanding       OT Short Term Goals - 05/02/18 1322      OT SHORT TERM GOAL #1   Title  Pt will apply psychosocial skills and coping mechanisms to daily activities in order to function independently and reintegrate into community dwelling    Time  4      OT SHORT TERM GOAL #2   Period  Weeks    Status  Achieved    Target Date  05/16/18      OT SHORT TERM GOAL #3   Title  Pt will recall and apply 1-3 sleep hygiene strategies to improve  performance in BADL routine    Time  4    Period  Weeks    Status  Achieved      OT SHORT TERM GOAL #4   Title  Pt will create and apply BADL routine to improve quality and frequency of personal hygiene    Time  4    Period  Weeks    Status  Achieved               Plan - 05/02/18 1313    Occupational performance deficits (Please refer to evaluation for details):  ADL's;IADL's;Rest and Sleep;Education;Work;Leisure;Social Participation       Patient will benefit from skilled therapeutic intervention in order to improve the following deficits and impairments:  Decreased coping skills, Decreased psychosocial skills, Other (comment)(decreased ability to engage in BADL and reintegrate into community)  Visit Diagnosis: Organic personality disorder  Difficulty coping    Problem  List Patient Active Problem List   Diagnosis Date Noted  . MDD (major depressive disorder), recurrent episode, severe ([place]) 04/11/2018   OCCUPATIONAL THERAPY DISCHARGE SUMMARY  Visits from Start of Care: 9  Current functional level related to goals / functional outcomes: Pt is stepping down to IOP level of care   Remaining deficits: Continuing to implement coping skills   Education / Equipment: Education given on psychosocial and coping mechanisms as it applies to improvement in functional BADL/IADL routine. Plan: Patient agrees to discharge.  Patient goals were met. Patient is being discharged due to meeting the stated rehab goals.  ?????         Zenovia Jarred, MSOT, OTR/L Behavioral Health OT/ Acute Relief OT PHP Office: [phone]  Zenovia Jarred 05/02/2018, 1:22 PM  [facility] HOSPITALIZATION PROGRAM [place], [place], [address] Phone: [phone]   Fax:  [phone]  Name: Julie [name] MRN: [id number] Date of Birth: 12/18/2000

## 2018-05-02 NOTE — Psych (Signed)
   [facility]  Jolene SchimkeXiane S [name] [id number]  Session Time: 9:00 - 11:00  Participation Level: Active  Behavioral Response: CasualAlertDepressed  Type of Therapy: Group Therapy  Treatment Goals addressed: Coping  Interventions: CBT, DBT, Solution Focused, Supportive and Reframing  Summary: Clinician led check-in regarding current stressors and situation, and review of patient completed daily inventory. Clinician utilized active listening and empathetic response and validated patient emotions. Clinician facilitated processing group on pertinent issues.   Therapist Response: Jolene SchimkeXiane S Schoneman is a 17 y.o. female who presents with depression symptoms. Patient arrived within time allowed and reports that she is feeling "normal." Pt reports her weekend was "okay" and that she is tired. Pt states she continued to move out of her apartment and spent time with her mom. Patient continues to struggle with sharing. Patient engaged in discussion.       Session Time: 11:00 -12:00  Participation Level: Active  Behavioral Response: CasualAlertDepressed  Type of Therapy: Group Therapy, psychotherapy  Treatment Goals addressed: Coping  Interventions: Psychosocial skills training, Supportive  Summary:  Occupational therapy group  Therapist Response: Patient engaged in group. See OT note.         Session Time: 12:00 - 12:45  Participation Level: Active  Behavioral Response: CasualAlertDepressed  Type of Therapy: Group Therapy  Treatment Goals addressed: Coping  Interventions: Psychologist, occupationalocial Skills Training, Supportive  Summary:  Reflection Group: Patients encouraged to practice skills and interpersonal techniques or work on mindfulness and relaxation techniques. The importance of self-care and making skills part of a routine to increase usage were stressed   Therapist Response: Patient engaged and participated appropriately.         Session Time: 12:45- 2:00  Participation Level: Active  Behavioral Response: CasualAlertDepressed  Type of Therapy: Group Therapy, Psychoeducation; Psychotherapy  Treatment Goals addressed: Coping  Interventions: CBT; Solution focused; Supportive; Reframing  Summary: 12:45 - 1:50 Clinician introduced topic of feelings and emotions. Clinician discussed the way to contextualize feelings as things that come and go and the ability to choose which feelings we attach to. Cln used metaphor of leaves in the wind. Group viewed TED talk "The power of vulnerability" and discussed how vulnerability and other difficult feelings can be thought of differently.  1:50 -2:00 Clinician led check-out. Clinician assessed for immediate needs, medication compliance and efficacy, and safety concerns     Therapist Response: Pt reports understanding of feelings and topics discussed. Pt is able to recognize ways in which this framework can affect how she views feelings.  At check-out, patient rates her mood at a 6 on a scale of 1-10 with 10 being great. Patient reports afternoon plans of cleaning her apartment and doing her hair. Patient demonstrates some progress as evidenced by identifying something that made her happy over the weekend. Patient denies SI/HI/self-harm thoughts at the end of group.       Suicidal/Homicidal: Nowithout intent/plan   Plan: Pt will continue in PHP while working to decrease depression symptoms and increase ability to manage symptoms in a healthy manner as they arise.    Diagnosis: Severe episode of recurrent major depressive disorder, without psychotic features (HCC) [F33.2]    1. Severe episode of recurrent major depressive disorder, without psychotic features (HCC)       Donia GuilesJenny Eural Holzschuh, LCSW 05/02/2018

## 2018-05-02 NOTE — Progress Notes (Signed)
  [facility] Behavioral Health Partial Hospitalization Outpatient Program Discharge Summary  Julie [name] [id number]  Admission date: 04/18/2018 Discharge date: 05/03/2018  Reason for admission:  Depression  Per patient inpatient note: Patient is a 17 year old female with essentially negative past psychiatric history who presented to the behavioral health hospital as a walk-in. Patient stated that she had been increasingly more depressed over the last several weeks, and began to think about killing herself. She stated she was thinking about jumping off the back daily at her apartment. She stated that it started approximately 2 weeks ago. She is been having difficulty with nausea and vomiting. They have been unable to come up with a cause for this. She stated because she had been ill she had to drop out of college, and that bother her a great deal. She admitted to helplessness, hopelessness and worthlessness. She she admitted to crying spells. She was admitted to the hospital for evaluation and stabilization.   Family of Origin Issues: Julie [name] continues to express that her parents are supportive during her admission and partial hospitalization in the recent transition into intensive outpatient programming.  Continues to ask report that she feels poor between both parents and they are going through a separation and potential divorce proceedings.  Progress in Program Toward Treatment Goals: Patient attended and participated with daily group session.  Patient continues to present flat and guarded but pleasant.  Patient has concerns with medication refills.  Paper prescription was provided at discharge.  Reports feeling slightly irritable today due to her car would not start, and states that this has a potential to run her whole day.  Patient was encouraged to utilize coping skills that was learned during her admission doing partial hospitalization.  Progress (rationale): Ongoing.  Patient continues  to deny suicidal or homicidal ideations.  Denies auditory visual hallucinations.  Rates his depression 3 out of 10 with 10 being the worst.  Overall she reports feeling better.  Patient continues to ruminate with unemployment and finances.  Support encouragement reassurance was provided  Treatment Plan Summary:  Stepping down to intensive outpatient programming (IOP)  Patient was provided with paper prescription for Lexapro 10 mg as she reported medication was not at pharmacy.   Take all medications as prescribed. Keep all follow-up appointments as scheduled.  Do not consume alcohol or use illegal drugs while on prescription medications. Report any adverse effects from your medications to your primary care provider promptly.  In the event of recurrent symptoms or worsening symptoms, call 911, a crisis hotline, or go to the nearest emergency department for evaluation.    Oneta Rack, NP 04/02/2018

## 2018-05-02 NOTE — Psych (Signed)
   [facility]  Julie SchimkeXiane S Covino [id number]  Session Time: 9:00 - 11:00  Participation Level: Active  Behavioral Response: CasualAlertDepressed  Type of Therapy: Group Therapy  Treatment Goals addressed: Coping  Interventions: CBT, DBT, Solution Focused, Supportive and Reframing  Summary: Clinician led check-in regarding current stressors and situation, and review of patient completed daily inventory. Clinician utilized active listening and empathetic response and validated patient emotions. Clinician facilitated processing group on pertinent issues.   Therapist Response: Julie [name] is a 17 y.o. female who presents with depression symptoms. Patient arrived within time allowed and reports that she is feeling "normal." Patient rates her mood at a 6 on a scale of 1-10 with 10 being great. Pt states she cleaned her apartment yesterday and should be able to be officially moved out  tomorrow. Pt shares spending the night at home otherwise and fell asleep before getting the chance to do her hair as she planned.  Patient continues to struggle with concentration. Patient engaged in discussion.       Session Time: 11:00 -12:00  Participation Level: Active  Behavioral Response: CasualAlertDepressed  Type of Therapy: Group Therapy, psychotherapy  Treatment Goals addressed: Coping  Interventions: Psychosocial skills training, Supportive  Summary:  Occupational therapy group  Therapist Response: Patient engaged in group. See OT note.         Session Time: 12:00 - 12:45  Participation Level: Active  Behavioral Response: CasualAlertDepressed  Type of Therapy: Group Therapy  Treatment Goals addressed: Coping  Interventions: Psychologist, occupationalocial Skills Training, Supportive  Summary:  Reflection Group: Patients encouraged to practice skills and interpersonal techniques or work on mindfulness and relaxation techniques. The importance of self-care  and making skills part of a routine to increase usage were stressed   Therapist Response: Patient engaged and participated appropriately.        Session Time: 12:45- 2:00  Participation Level: Active  Behavioral Response: CasualAlertDepressed  Type of Therapy: Group Therapy, Psychoeducation; Psychotherapy  Treatment Goals addressed: Coping  Interventions: CBT; Solution focused; Supportive; Reframing  Summary: 12:45 - 1:50 Clinician continued topic of feelings. Clinician led review of topic. Group played feelings Jenga and group members worked to access specificity with feeling recognition. 1:50 -2:00 Clinician led check-out. Clinician assessed for immediate needs, medication compliance and efficacy, and safety concerns     Therapist Response: Pt participated in activity. Pt is able to recognize and define a spectrum of feeling words and how she has experienced them. At check-out, patient rates her mood at a 7 on a scale of 1-10 with 10 being great. Patient reports afternoon plans of signing her apartment over to subleaser and trying to get her hair done. Patient demonstrates some progress as evidenced by reporting utilizing skill of different emotion last night. Patient denies SI/HI/self-harm thoughts at the end of group.       Suicidal/Homicidal: Nowithout intent/plan   Plan: Pt will continue in PHP while working to decrease depression symptoms and increase ability to manage symptoms in a healthy manner as they arise.    Diagnosis: Severe episode of recurrent major depressive disorder, without psychotic features (HCC) [F33.2]    1. Severe episode of recurrent major depressive disorder, without psychotic features (HCC)       Donia GuilesJenny Lamees Gable, LCSW 05/02/2018

## 2018-05-03 ENCOUNTER — Other Ambulatory Visit (HOSPITAL_COMMUNITY): Payer: 59 | Admitting: Licensed Clinical Social Worker

## 2018-05-03 DIAGNOSIS — F332 Major depressive disorder, recurrent severe without psychotic features: Secondary | ICD-10-CM

## 2018-05-03 DIAGNOSIS — F329 Major depressive disorder, single episode, unspecified: Secondary | ICD-10-CM | POA: Diagnosis not present

## 2018-05-03 NOTE — Psych (Signed)
   [facility]  Julie SchimkeXiane S [name] [id number]  Session Time: 9:00 - 11:00  Participation Level: Active  Behavioral Response: CasualAlertDepressed  Type of Therapy: Group Therapy  Treatment Goals addressed: Coping  Interventions: CBT, DBT, Solution Focused, Supportive and Reframing  Summary: Clinician led check-in regarding current stressors and situation, and review of patient completed daily inventory. Clinician utilized active listening and empathetic response and validated patient emotions. Clinician facilitated processing group on pertinent issues.   Therapist Response: Julie [name] is a 17 y.o. female who presents with depression symptoms. Patient arrived within time allowed and reports that she is feeling "normal." Patient rates her mood at a 5 on a scale of 1-10 with 10 being great. Patient reports passive suicidal ideation last night, with thoughts of "do I matter" and "what is the point of my being here." Pt reports she managed by going to bed. Pt states that she is fully done with and moved out of her apartment. Pt states that she had dinner with her father and watched T.V. In the evening. Patient continues to struggle with utilizing distraction skills. Patient engaged in discussion.        Session Time: 11:00 -12:15  Participation Level: Active  Behavioral Response: CasualAlertDepressed  Type of Therapy: Group Therapy, psychotherapy  Treatment Goals addressed: Coping  Interventions: Strengths based, reframing, Supportive,   Summary:  Spiritual Care group  Therapist Response: Patient engaged in group. See chaplain note.         Session Time: 12:15 - 1:00  Participation Level: Active  Behavioral Response: CasualAlertDepressed  Type of Therapy: Group Therapy  Treatment Goals addressed: Coping  Interventions: Psychologist, occupationalocial Skills Training, Supportive  Summary:  Reflection Group: Patients encouraged to practice skills and interpersonal  techniques or work on mindfulness and relaxation techniques. The importance of self-care and making skills part of a routine to increase usage were stressed   Therapist Response: Patient engaged and participated appropriately.        Session Time: 1:00- 2:00  Participation Level: Active  Behavioral Response: CasualAlertDepressed  Type of Therapy: Group Therapy, Psychoeducation  Treatment Goals addressed: Coping  Interventions: relaxation training; Supportive; Reframing  Summary: 12:45 - 1:50: Relaxation group: Cln led group focused on retraining the body's response to stress.   1:50 -2:00 Clinician led check-out. Clinician assessed for immediate needs, medication compliance and efficacy, and safety concerns   Therapist Response: Patient engaged in activity and discussion. At check-out, patient rates her mood at a 8 on a scale of 1-10 with 10 being great. Patient reports afternoon plans of doing nothing. Patient demonstrates some progress as evidenced by insight into source of internal conflict. Patient denies SI/HI/self-harm thoughts at the end of group.       Suicidal/Homicidal: Nowithout intent/plan   Plan: Pt will continue in PHP while working to decrease depression symptoms and increase ability to manage symptoms in a healthy manner as they arise.    Diagnosis: Severe episode of recurrent major depressive disorder, without psychotic features (HCC) [F33.2]    1. Severe episode of recurrent major depressive disorder, without psychotic features (HCC)       Donia GuilesJenny Theola Cuellar, LCSW 05/03/2018

## 2018-05-04 ENCOUNTER — Other Ambulatory Visit (HOSPITAL_COMMUNITY): Payer: 59 | Admitting: Psychiatry

## 2018-05-04 ENCOUNTER — Other Ambulatory Visit (HOSPITAL_COMMUNITY): Payer: Self-pay

## 2018-05-04 ENCOUNTER — Encounter (HOSPITAL_COMMUNITY): Payer: Self-pay | Admitting: Family

## 2018-05-04 ENCOUNTER — Ambulatory Visit (HOSPITAL_COMMUNITY): Payer: Self-pay

## 2018-05-04 DIAGNOSIS — F322 Major depressive disorder, single episode, severe without psychotic features: Secondary | ICD-10-CM

## 2018-05-04 NOTE — Progress Notes (Signed)
Arlyss RepressXiane S Salomone is a 17 y.o. single, employed, [place]:  As per previous [facility] note states:  [facility] Pt was inpt due to SI with plan to jump off balcony at apt. Pt reports hx of depression and denies tx hx. Pt reports she has cut herself "a couple of times" since middle school; most recently last week on thighs. Pt states cuts did not need medical attention. Pt reports she dropped out of college and quit her job due to depression symptoms. Pt reports she also was sick "with maybe Mono" and felt weak for a couple of months. Pt reports weight loss from 250 to 218 in a couple of months; pt states she is back to 236lbs. Pt reports decrease in ADLs: Pt is showering about 1x a week; pt not cooking or cleaning. Pt reports lack of supports and identifies mother and father "but I don't talk to them about this stuff" and "maybe one friend." Pt denies HI/AVH and denies plan/intent with SI. Pt transitioned to MH-IOP today.  Reports continued depressive symptoms.  Admits to passive on and off SI (denies a plan or intent).  Pt states she learned some skills in PHP.  Pt inquiring about aftercare plans. States she is currently residing with her father.  CC: other notes for hx A:  Oriented pt to MH-IOP.  Provided pt with an orientation folder.  Will refer pt to a psychiatrist and therapist.  Encouraged support groups.  R:  Pt receptive.        Chestine SporeLARK, RITA, M.Ed,CNA

## 2018-05-04 NOTE — Psych (Signed)
   [facility] THERAP[facility]ESS [facility]  Julie SchimkeXiane S [name] [id number]  Session Time: 9:00 - 11:00  Participation Level: Active  Behavioral Response: CasualAlertDepressed  Type of Therapy: Group Therapy  Treatment Goals addressed: Coping  Interventions: CBT, DBT, Solution Focused, Supportive and Reframing  Summary: Clinician led check-in regarding current stressors and situation, and review of patient completed daily inventory. Clinician utilized active listening and empathetic response and validated patient emotions. Clinician facilitated processing group on pertinent issues.   Therapist Response: Julie SchimkeXiane S [name] is a 17 y.o. female who presents with depression symptoms. Patient arrived within time allowed and reports that she is feeling "better than normal." Patient rates her mood at a 7 on a scale of 1-10 with 10 being great. Pt reports she didn't get to do her hair again yesterday. Pt reports having an interview scheduled tomorrow for a part time job and feeling happy about that. Patient continues to struggle with opening up. Patient engaged in discussion.       Session Time: 11:00 -12:00  Participation Level: Active  Behavioral Response: CasualAlertDepressed  Type of Therapy: Group Therapy, psychotherapy  Treatment Goals addressed: Coping  Interventions: Psychosocial skills training, Supportive  Summary:  Occupational therapy group  Therapist Response: Patient engaged in group. See OT note.         Session Time: 12:00 - 12:45  Participation Level: Active  Behavioral Response: CasualAlertDepressed  Type of Therapy: Group Therapy  Treatment Goals addressed: Coping  Interventions: Psychologist, occupationalocial Skills Training, Supportive  Summary:  Reflection Group: Patients encouraged to practice skills and interpersonal techniques or work on mindfulness and relaxation techniques. The importance of self-care and making skills part of a routine to increase usage were  stressed   Therapist Response: Patient engaged and participated appropriately.        Session Time: 12:45 - 2:00   Participation Level: Active   Behavioral Response: CasualAlertDepressed   Type of Therapy: Group Therapy, Psychotherapy; Psychoeducation   Treatment Goals addressed: Coping   Interventions: CBT, Solution focused, Supportive, Reframing   Summary: 12:45 - 1:50 Cln introduced topic of boundaries. Cln provided psychoeducation on what boundaries are, porous, rigid and healthy boundary characteristics, and the different types of boundaries. Group related the information to themselves to begin discovering potential boundary issues.  1:50 -2:00 Clinician led check-out. Clinician assessed for immediate needs, medication compliance and efficacy, and safety concerns      Therapist Response: Patient engaged in group. Pt reports understanding of boundaries and shares she has mainly porous boundaries.  At check-out, patient rates her mood at a 7 on a scale of 1-10 with 10 being great. Pt reports afternoon plans of spending time with her mom. Patient demonstrates some progress as evidenced by sleeping well. Patient denies SI/HI/self-harm thoughts at the end of group.       Suicidal/Homicidal: Nowithout intent/plan   Plan: Pt will continue in PHP while working to decrease depression symptoms and increase ability to manage symptoms in a healthy manner as they arise.    Diagnosis: Severe episode of recurrent major depressive disorder, without psychotic features (HCC) [F33.2]    1. Severe episode of recurrent major depressive disorder, without psychotic features (HCC)       Donia GuilesJenny Odesser Tourangeau, LCSW 05/04/2018

## 2018-05-04 NOTE — Psych (Signed)
[facility] BH PHP THERAPIST PROGRESS NOTE  Julie [name] [id number]  Session Time: 9:00 - 10:15  Participation Level: Active  Behavioral Response: CasualAlertDepressed  Type of Therapy: Group Therapy  Treatment Goals addressed: Coping  Interventions: CBT, DBT, Solution Focused, Supportive and Reframing  Summary: Clinician led check-in regarding current stressors and situation, and review of patient completed daily inventory. Clinician utilized active listening and empathetic response and validated patient emotions. Clinician facilitated processing group on pertinent issues.   Therapist Response: Julie [name] is a 17 y.o. female who presents with depression symptoms. Patient arrived within time allowed and reports that she is feeling "pretty good." Patient rates her mood at a 7 on a scale of 1-10 with 10 being great. Pt states she again did not get to her hair and just felt "too tired." Pt reports she struggled last night with more thoughts of "how little I matter." Pt states going to sleep to manage the thoughts.  Patient continues to struggle with sharing in group and isolation. Patient engaged in discussion.       Session Time: 10:15 -11:00  Participation Level: Active  Behavioral Response: CasualAlertDepressed  Type of Therapy: Group Therapy, psychoeducation, psychotherapy  Treatment Goals addressed: Coping  Interventions: CBT, DBT, Solution Focused, Supportive and Reframing  Summary:  Clinician led discussion on hopelessness and how to manage it. Group was able to share and process triggers for their hopelessness and how to reframe.      Therapist Response: Patient engaged and participated in discussion. Pt reports increased insight into ways to manage hopelessness when it arises.       Session Time: 11:00 -12:00   Participation Level: Active   Behavioral Response: CasualAlertDepressed   Type of Therapy: Group Therapy, OT   Treatment Goals addressed: Coping   Interventions: Psychosocial skills training, Supportive,    Summary:  Occupational Therapy group   Therapist Response: Patient engaged in group. See OT note.        Session Time: 12:00- 1:00  Participation Level: Active  Behavioral Response: CasualAlertDepressed  Type of Therapy: Group Therapy, Psychoeducation; Psychotherapy  Treatment Goals addressed: Coping  Interventions: CBT; Solution focused; Supportive; Reframing Summary: 12:00 - 12:50: Clinician introduced topic of "Positive Psychology." Group watched "The Happiness Advantage" TED talk and discussed how the "lens" through which they view life affects the way they feel. Pts identified a strategy they would be willing to try to change their "lens."  12:50 -1:00 Clinician led check-out. Clinician assessed for immediate needs, medication compliance and efficacy, and safety concerns.   Therapist Response:  Pt engaged in discussion regarding ways to train your mind to scan for the positive. Pt reports willingness to try daily gratitudes as a way to practice.   At check-out, patient rates her mood at a 7 on a scale of 1-10 with 10 being great. Patient reports afternoon plans of going to a job interview and [employer]. Patient demonstrates some progress as evidenced by reporting positive thoughts about her job interview when earlier int he week they were mainly defeating. Patient denies SI/HI/self-harm at the end of group.     Suicidal/Homicidal: Nowithout intent/plan   Plan: Pt will continue in PHP while working to decrease depression symptoms and increase ability to manage symptoms in a healthy manner as they arise.    Diagnosis: Severe episode of recurrent major depressive disorder, without psychotic features (HCC) [F33.2]    1. Severe episode of recurrent major depressive disorder, without psychotic features (HCC)  Julie GuilesJenny Tawan Corkern, LCSW 05/04/2018

## 2018-05-04 NOTE — Progress Notes (Signed)
Psychiatric Initial Adult Assessment   Patient Identification: Julie [name] MRN:  [id number] Date of Evaluation:  05/04/2018 Referral Source: Partial Hospitalization (PHP) Chief Complaint:   Depression  Visit Diagnosis:    ICD-10-CM   1. Current severe episode of major depressive disorder without psychotic features without prior episode (HCC) F32.2     History of Present Illness: [name]  is a 17 y.o. African American female presents with worseing depression and suicidal ideations.patient reports battling depression since around age 17. Reports she has intermittent suicidal ideation without plan or intent, however reports more recently she has been dealing with multiple stressors.  Reports she recently dropped out of school.  Reports she feels like to go between between her parents recent separation.  States she is currently unemployed as she used to work for [employer].  Reports her younger brother is dealing with court situation.  Reports her oldest brother recently got kicked out of his house as his has to care for 19-year-old child.   Evaluation: Chairty seen sitting in day room.  Presents flat guarded.  Reports some irritability related to her car not starting but pleasant.  Reports concerns with financial strain as she states she is not working currently however reports has interviews schedule.  Patient reports "good and bad days".  Reports taking Lexapro as prescribed and tolerating it well.  Denies homicidal or suicidal ideations.  Denies auditory or visual hallucinations.  Patient to be discharged from partial hospitalization and admitted to intensive outpatient programming on 05/04/2018.  Support encouragement reassurance was provided.  Associated Signs/Symptoms: Depression Symptoms:  depressed mood, feelings of worthlessness/guilt, difficulty concentrating, suicidal thoughts without plan, (Hypo) Manic Symptoms:  Impulsivity, Irritable  Mood, Anxiety Symptoms:  Excessive Worry, Psychotic Symptoms:  Hallucinations: None PTSD Symptoms: Avoidance:  Decreased Interest/Participation  Past Psychiatric History: Recent discharge from inpatient admission and completion of partial hosptilization   Previous Psychotropic Medications: No   Substance Abuse History in the last 12 months:  Yes.   reports intermittent THC use   Consequences of Substance Abuse: NA  Past Medical History:  Past Medical History:  Diagnosis Date  . CMV (cytomegalovirus infection) (HCC) 04/13/2018  . Dysmenorrhea   . Painful menstrual periods     Past Surgical History:  Procedure Laterality Date  . TONSILLECTOMY      Family Psychiatric History:   Family History:  Family History  Problem Relation Age of Onset  . Hypertension Mother   . Chronic Renal Failure Father   . Depression Father   . Alcohol abuse Maternal Uncle   . Depression Paternal Uncle   . Dementia Maternal Grandmother   . Depression Paternal Grandfather   . Depression Paternal Grandmother     Social History:   Social History   Socioeconomic History  . Marital status: Single    Spouse name: Not on file  . Number of children: Not on file  . Years of education: Not on file  . Highest education level: Not on file  Occupational History  . Not on file  Social Needs  . Financial resource strain: Not hard at all  . Food insecurity:    Worry: Often true    Inability: Not on file  . Transportation needs:    Medical: No    Non-medical: No  Tobacco Use  . Smoking status: Never Smoker  . Smokeless tobacco: Never Used  Substance and Sexual Activity  . Alcohol use: Never    Frequency: Never  .  Drug use: Not Currently    Types: Marijuana    Comment: Has done this twice and last time 1 week prior  . Sexual activity: Not Currently    Partners: Male    Birth control/protection: None  Lifestyle  . Physical activity:    Days per week: 0 days    Minutes per session: Not on  file  . Stress: To some extent  Relationships  . Social connections:    Talks on phone: More than three times a week    Gets together: Once a week    Attends religious service: Never    Active member of club or organization: No    Attends meetings of clubs or organizations: Never    Relationship status: Never married  Other Topics Concern  . Not on file  Social History Narrative   ** Merged History Encounter **        Additional Social History:   Allergies:  No Known Allergies  Metabolic Disorder Labs: No results found for: HGBA1C, MPG Lab Results  Component Value Date   PROLACTIN 38.2 (H) 04/11/2018   Lab Results  Component Value Date   CHOL 177 04/11/2018   TRIG 107 04/11/2018   HDL 40 (L) 04/11/2018   CHOLHDL 4.4 04/11/2018   VLDL 21 04/11/2018   LDLCALC 116 (H) 04/11/2018     Current Medications: Current Outpatient Medications  Medication Sig Dispense Refill  . cetirizine (ZYRTEC) 10 MG tablet Take 10 mg by mouth every morning.  4  . escitalopram (LEXAPRO) 10 MG tablet Take 1 tablet (10 mg total) by mouth daily. For depression 60 tablet 0  . hydrOXYzine (ATARAX/VISTARIL) 25 MG tablet Take 1 tablet (25 mg total) by mouth every 6 (six) hours as needed for anxiety. 60 tablet 0  . Norgestimate-Ethinyl Estradiol Triphasic (TRI-SPRINTEC) 0.18/0.215/0.25 MG-35 MCG tablet Take 1 tablet by mouth at bedtime. Birth control method (Patient not taking: Reported on 04/27/2018) 1 Package 11  . sulfamethoxazole-trimethoprim (BACTRIM,SEPTRA) 400-80 MG tablet Take 1 tablet by mouth every 12 (twelve) hours. For urinary tract infection. (Patient not taking: Reported on 04/27/2018) 13 tablet 0   No current facility-administered medications for this visit.     Neurologic: Headache: No Seizure: No Paresthesias:No  Musculoskeletal: Strength & Muscle Tone: within normal limits Gait & Station: normal Patient leans: N/A  Psychiatric Specialty Exam: ROS  There were no vitals taken  for this visit.There is no height or weight on file to calculate BMI.  General Appearance: Casual  Eye Contact:  Good  Speech:  Clear and Coherent  Volume:  Normal  Mood:  Anxious and Depressed  Affect:  Congruent  Thought Process:  Coherent  Orientation:  Full (Time, Place, and Person)  Thought Content:  Logical  Suicidal Thoughts:  No  Homicidal Thoughts:  No  Memory:  Immediate;   Fair Recent;   Fair Remote;   Fair  Judgement:  Fair  Insight:  Fair  Psychomotor Activity:  Normal  Concentration:  Concentration: Fair  Recall:  Good  Fund of Knowledge:Fair  Language: Fair  Akathisia:  No  Handed:  Right  AIMS (if indicated):   Assets:  Communication Skills Desire for Improvement Resilience Social Support  ADL's:  Intact  Cognition: WNL  Sleep:      Treatment Plan Summary: Admitted to  Intensive outpatient program (IOP)  Medication management    Continue Lexapro 10 mg po QD  Patient was provided with paper prescriptions at discharge from partial hospitalizations.  Treatment plan  was agreed and was reviewed upon by NP T. Melvyn Neth and patient Adair Lemar  need for continued group services     Oneta Rack, NP 11/14/201910:32 AM

## 2018-05-05 ENCOUNTER — Ambulatory Visit (HOSPITAL_COMMUNITY): Payer: Self-pay

## 2018-05-05 ENCOUNTER — Other Ambulatory Visit (HOSPITAL_COMMUNITY): Payer: 59 | Admitting: Psychiatry

## 2018-05-05 ENCOUNTER — Other Ambulatory Visit (HOSPITAL_COMMUNITY): Payer: Self-pay

## 2018-05-05 DIAGNOSIS — F329 Major depressive disorder, single episode, unspecified: Secondary | ICD-10-CM | POA: Diagnosis not present

## 2018-05-05 DIAGNOSIS — F322 Major depressive disorder, single episode, severe without psychotic features: Secondary | ICD-10-CM

## 2018-05-08 ENCOUNTER — Ambulatory Visit (HOSPITAL_COMMUNITY): Payer: Self-pay

## 2018-05-08 ENCOUNTER — Telehealth (HOSPITAL_COMMUNITY): Payer: Self-pay | Admitting: Psychiatry

## 2018-05-08 ENCOUNTER — Other Ambulatory Visit (HOSPITAL_COMMUNITY): Payer: 59 | Admitting: Psychiatry

## 2018-05-08 ENCOUNTER — Other Ambulatory Visit (HOSPITAL_COMMUNITY): Payer: Self-pay

## 2018-05-08 NOTE — Progress Notes (Signed)
    Daily Group Progress Note  Program: IOP  Group Time: 9am-12pm  Participation Level: Active  Behavioral Response: Appropriate and Motivated  Type of Therapy:  Group Therapy; process group, psycho-educational group.  Summary of Progress:  The purpose of this group is to utilize CBT and DBT skills in a group setting to increase utilization of healthy coping skills and decrease intensity of active mental health symptoms.  9am-10:30am Clinician presented the topic of Stress-Monitorying as effective CBT technique. Clinician reminded clients of the coneection of thoughts, emotions, and bheaviors. Clinician shared that stress management is a compondnet for relapse prevention which includes anticipating triggers, preparing alternative bheaviors, utilizing mindfulness and coping skills. Clinician reviewed with group members 'The Stress Thermometer.' Clinician and group members discussed what different levels of stress looked like for each client, including behaviors and body sensations.  10:30am-12pm Clinician and clients practiced skill building with 'Techniques for Lowering Stress.' Clinician reminded clients of the importance of practicing skills regularly, even when not feeling uncomfortable emotions to build mastery of skills. Clinician and group members practiced 5-7-9 breathing, body scanning, progressive muscle relaxation, guided imagery, and discussed creating gratitude lists as part of journaling. Client participated in group discussions and activities. Client role played in group what she felt comfortable sharing with the family at thanksgiving related to her mental health. Client practiced repeating phrases until comfortable. Client was receptive to feedback and support from group members. Client was able to identifying differences in body at different levels of emotions.   Harlon DittyKarissa A Alonda Weaber, LCSW

## 2018-05-09 ENCOUNTER — Other Ambulatory Visit (HOSPITAL_COMMUNITY): Payer: 59 | Admitting: Psychiatry

## 2018-05-09 ENCOUNTER — Other Ambulatory Visit (HOSPITAL_COMMUNITY): Payer: Self-pay

## 2018-05-09 ENCOUNTER — Ambulatory Visit (HOSPITAL_COMMUNITY): Payer: Self-pay

## 2018-05-09 ENCOUNTER — Telehealth (HOSPITAL_COMMUNITY): Payer: Self-pay | Admitting: Psychiatry

## 2018-05-09 NOTE — Telephone Encounter (Signed)
D:  Pt phoned and left vm that she did get writer's message.  Pt is requesting that she not be discharged.  Reports she took a whole Trazodone lastnight and was just waking up this afternoon.  "I hadn't taken a pill in awhile and forgot that I suppose to take half not whole." Pt states she will return to MH-IOP tomorrow.  A:  Inform treatment team.

## 2018-05-10 ENCOUNTER — Other Ambulatory Visit (HOSPITAL_COMMUNITY): Payer: 59 | Admitting: Licensed Clinical Social Worker

## 2018-05-10 DIAGNOSIS — F322 Major depressive disorder, single episode, severe without psychotic features: Secondary | ICD-10-CM

## 2018-05-10 DIAGNOSIS — F329 Major depressive disorder, single episode, unspecified: Secondary | ICD-10-CM | POA: Diagnosis not present

## 2018-05-10 NOTE — Psych (Signed)
   [facility]  Jolene SchimkeXiane S [name] [id number]  Session Time: 9:00 - 11:00  Participation Level: Active  Behavioral Response: CasualAlertDepressed  Type of Therapy: Group Therapy  Treatment Goals addressed: Coping  Interventions: CBT, DBT, Solution Focused, Supportive and Reframing  Summary: Clinician led check-in regarding current stressors and situation, and review of patient completed daily inventory. Clinician utilized active listening and empathetic response and validated patient emotions. Clinician facilitated processing group on pertinent issues.   Therapist Response: [name] is a 17 y.o. female who presents with depression symptoms. Patient arrived within time allowed and reports that she is feeling "normal." Patient rates her mood at a 5 on a scale of 1-10 with 10 being great. Pt states she went to a group interview on Friday and she got the job. Pt states feeling positive about the job. Pt shares she slept well throughout the weekend and organized her room to feel more settled. Pt reports struggling with loneliness. Patient able to process. Patient engaged in discussion.       Session Time: 11:00 -12:00  Participation Level: Active  Behavioral Response: CasualAlertDepressed  Type of Therapy: Group Therapy, psychotherapy  Treatment Goals addressed: Coping  Interventions: Psychosocial skills training, Supportive  Summary:  Occupational therapy group  Therapist Response: Patient engaged in group. See OT note.         Session Time: 12:00 - 12:45  Participation Level: Active  Behavioral Response: CasualAlertDepressed  Type of Therapy: Group Therapy  Treatment Goals addressed: Coping  Interventions: Psychologist, occupationalocial Skills Training, Supportive  Summary:  Reflection Group: Patients encouraged to practice skills and interpersonal techniques or work on mindfulness and relaxation techniques. The importance of self-care and  making skills part of a routine to increase usage were stressed   Therapist Response: Patient engaged and participated appropriately.        Session Time: 12:45 - 2:00  Participation Level: Alert  Behavioral Response: CasualAlertDepressed  Type of Therapy: Group Therapy, Psychoeducation; Psychotherapy  Treatment Goals addressed: Coping  Interventions: CBT; Solution focused; Supportive; Reframing  Summary: 12:45 - 1:50: Cln discussed how to set and maintain boundaries. Group reviewed handout "How to set boundaries" and walked through steps together.  1:50 -2:00 Clinician led check-out. Clinician assessed for immediate needs, medication compliance and efficacy, and safety concerns   Therapist Response: Patient engaged in activity. Pt reports understanding of how to set boundaries, however states feeling uneasy about implementing. At check-out, patient rates her mood at a 5 on a scale of 1-10 with 10 being great. Pt reports afternoon plans of picking out a new journal and going to fill out paperwork at her new job. Patient demonstrates some progress as evidenced by finding ways to fill time productively. Patient denies SI/HI/self-harm thoughts at the end of group.       Suicidal/Homicidal: Nowithout intent/plan   Plan: Pt will continue in PHP while working to decrease depression symptoms and increase ability to manage symptoms in a healthy manner as they arise.    Diagnosis: Severe episode of recurrent major depressive disorder, without psychotic features (HCC) [F33.2]    1. Severe episode of recurrent major depressive disorder, without psychotic features (HCC)       Donia GuilesJenny Hershy Flenner, LCSW 05/10/2018

## 2018-05-10 NOTE — Progress Notes (Signed)
    Daily Group Progress Note  Program: IOP  Group Time: 9am-12pm  Participation Level: Active  Behavioral Response: Appropriate  Type of Therapy:  Group Therapy; psycho-educational group, process group  Summary of Progress:  The purpose of this group is to utilize CBT and DBT skills in a group setting to increase use of healthy coping skills and decrease intensity of active mental health symptoms.  9am-10:30am Clinician provided psychoeducational material related to Emotional Freedom Techniques, or Tapping. Clinician demonstrated points for clients and provided options for focusing. Clinician explained the goal of the Tapping procedure is to accept a feeling fully, acknowledge the thoughts the feeling creates, and create more helpful or realistic thoughts which in turn decrease the intensity of an unhelpful feeling. Clinician facilitated discussion with group members on the importance of fully identifying and feeling an emotion, uncomfortable or not. Clinician and group members discussed why someone would chose not to feel some feelings and the possible consequences of "stuffing" feelings. Clinician and group members participated in a guided tapping exercise to address thoughts and feelings related to 'Quieting the voice that says You are not enough." Clinician facilitated discussion on clients' responses to activity. Clinician actively listed and validated the need to feel safe when expressing feelings and clients discussion on expectations and barriers to gaining support.  10:30am-12pm Clinician facilitated a discussion on self esteem utilizing admiration and positive self trait exercise Totika. Activity focused on using open-ended questions intended to promote discussions and processing about self-confidence, setting and achieving goals, role models, motivation, personal success and happiness. Client shared concerns about continuing group due to having to provide transportation for her brother.  Client verbalized staying busy to avoid focus on negative self talk and any feelings of worthlessness which she identifies still having some of. Client participated fully in group discussions and activities.   Harlon DittyKarissa A Eulalah Rupert, LCSW

## 2018-05-11 ENCOUNTER — Telehealth (HOSPITAL_COMMUNITY): Payer: Self-pay | Admitting: Psychiatry

## 2018-05-11 ENCOUNTER — Other Ambulatory Visit (HOSPITAL_COMMUNITY): Payer: 59 | Admitting: Psychiatry

## 2018-05-11 NOTE — Psych (Signed)
[facility] BH PHP THERAPIST PROGRESS NOTE  Julie [name] [id number]  Session Time: 9:00 - 11:00  Participation Level: Active  Behavioral Response: CasualAlertDepressed  Type of Therapy: Group Therapy  Treatment Goals addressed: Coping  Interventions: CBT, DBT, Solution Focused, Supportive and Reframing  Summary: Clinician led check-in regarding current stressors and situation, and review of patient completed daily inventory. Clinician utilized active listening and empathetic response and validated patient emotions. Clinician facilitated processing group on pertinent issues.   Therapist Response: Julie [name] is a 17 y.o. female who presents with depression symptoms. Patient arrived within time allowed and reports that she is feeling "irritated". Patient rates her mood at a 4 on a scale of 1-10 with 10 being great. Pt is visibly upset and reports anger with her father due to feeling he is immature and prideful and this is putting her at risk. Pt states they are being threatened with eviction because "he thinks he is smarter than everyone." Pt is able to problem solve ways to deal with this issue both practically and emotionally.  Pt states that yesterday she picked-up her work shoes and went to work orientation which went well. Patient engaged in discussion.        Session Time: 11:00 -12:15  Participation Level: Active  Behavioral Response: CasualAlertDepressed  Type of Therapy: Group Therapy, psychotherapy  Treatment Goals addressed: Coping  Interventions: Strengths based, reframing, Supportive,   Summary:  Spiritual Care group  Therapist Response: Patient engaged in group. See chaplain note.         Session Time: 12:15 - 1:00  Participation Level: Active  Behavioral Response: CasualAlertDepressed  Type of Therapy: Group Therapy  Treatment Goals addressed: Coping  Interventions: Psychologist, occupational, Supportive  Summary:  Reflection Group: Patients  encouraged to practice skills and interpersonal techniques or work on mindfulness and relaxation techniques. The importance of self-care and making skills part of a routine to increase usage were stressed   Therapist Response: Patient engaged and participated appropriately.        Session Time: 1:00- 2:00  Participation Level: Active  Behavioral Response: CasualAlertAnxious and Depressed  Type of Therapy: Group Therapy, Psychoeducation; Psychotherapy  Treatment Goals addressed: Coping  Interventions: CBT; Solution focused; Supportive; Reframing  Summary: 12:45 - 1:50: Cln continued topic of communication. Cln introduced "I" Statements and how to formulate them as well as common pitfalls and how to avoid them. 1:50 -2:00 Clinician led check-out. Clinician assessed for immediate needs, medication compliance and efficacy, and safety concerns   Therapist Response: Patient engaged in group. Pt reports understanding of "I" Statements and successfully formulated her own in practice.   At check-out, patient rates her mood at a 5 on a scale of 1-10 with 10 being great. Patient reports afternoon plans of posting some things for sale and hopefully talking to her father. Patient demonstrates some progress as evidenced by recognizing and processing anger effectively. Patient denies SI/HI/self-harm thoughts at the end of grou        Suicidal/Homicidal: Nowithout intent/plan   Plan: Pt will discharge from PHP due to meeting treatment goals of decreased depression symptoms and increased ability to manage symptoms in a healthy manner. Pt's progress noted by self report, observation, and scales. Pt will step down to IOP within this agency beginning 05/04/18. Pt and provider are aligned with discharge. Pt denies SI/HI/AVH at time of discharge.    Diagnosis: Severe episode of recurrent major depressive disorder, without psychotic features (HCC) [F33.2]    1. Severe  episode of recurrent major  depressive disorder, without psychotic features (HCC)       Julie GuilesJenny Linh Hedberg, LCSW 05/11/2018

## 2018-05-11 NOTE — Psych (Signed)
[facility] THERAPIST PROGRESS [facility]  Julie SchimkeXiane S [name] [id number]  Session Time: 9:00 - 11:00  Participation Level: Active  Behavioral Response: CasualAlertDepressed  Type of Therapy: Group Therapy  Treatment Goals addressed: Coping  Interventions: CBT, DBT, Solution Focused, Supportive and Reframing  Summary: Clinician led check-in regarding current stressors and situation, and review of patient completed daily inventory. Clinician utilized active listening and empathetic response and validated patient emotions. Clinician facilitated processing group on pertinent issues.   Therapist Response: Julie [name] is a 17 y.o. female who presents with depression symptoms. Patient arrived within time allowed and reports that she is feeling "frustrated." Patient rates her mood at a 4 on a scale of 1-10 with 10 being great. Pt states she had a rough morning because her car wouldn't start and she was stressed and annoyed. Pt states yesterday she completed her plans and was able to do her hair, which accomplishes a goal she has had for over a week. Pt reports having SI last night wondering "why am I here?" and denies plan or intent. Pt states she did her hair to manage her thoughts. Pt reports struggling with negative thoughts at night and is able to brainstorm ways to manage the night. Patient able to process. Patient engaged in discussion.       Session Time: 11:00 -12:00  Participation Level: Active  Behavioral Response: CasualAlertDepressed  Type of Therapy: Group Therapy, psychotherapy  Treatment Goals addressed: Coping  Interventions: Psychosocial skills training, Supportive  Summary:  Occupational therapy group  Therapist Response: Patient engaged in group. See OT note.         Session Time: 12:00 - 12:45  Participation Level: Active  Behavioral Response: CasualAlertDepressed  Type of Therapy: Group Therapy  Treatment Goals addressed:  Coping  Interventions: Psychologist, occupationalocial Skills Training, Supportive  Summary:  Reflection Group: Patients encouraged to practice skills and interpersonal techniques or work on mindfulness and relaxation techniques. The importance of self-care and making skills part of a routine to increase usage were stressed   Therapist Response: Patient engaged and participated appropriately.        Session Time: 12:45 - 2:00  Participation Level: Alert  Behavioral Response: CasualAlertDepressed  Type of Therapy: Group Therapy, Psychoeducation; Psychotherapy  Treatment Goals addressed: Coping  Interventions: CBT; Solution focused; Supportive; Reframing  Summary: 12:45 - 1:50: Cln facilitated boundaries workshop. Group members discussed boundary issues from their lives and group provided feedback and problem solving based on boundary education.  1:50 -2:00 Clinician led check-out. Clinician assessed for immediate needs, medication compliance and efficacy, and safety concerns   Therapist Response: Patient engaged in activity. Pt shared boundary issues with herself and struggling with saying no. Pt accepted and provided feedback and reports increased understanding of how to set and maintain boundaries.  At check-out, patient rates her mood at a 6 on a scale of 1-10 with 10 being great. Pt reports afternoon plans of getting work shoes and going to orientation. Patient demonstrates some progress as evidenced by effectively managing SI last night. Patient denies SI/HI/self-harm thoughts at the end of group.       Suicidal/Homicidal: Nowithout intent/plan   Plan: Pt will continue in PHP while working to decrease depression symptoms and increase ability to manage symptoms in a healthy manner as they arise.    Diagnosis: Current severe episode of major depressive disorder without psychotic features without prior episode (HCC) [F32.2]    1. Current severe episode of major depressive disorder  without psychotic features  without prior episode [facility])       Julie Guiles, LCSW 05/11/2018

## 2018-05-12 ENCOUNTER — Other Ambulatory Visit (HOSPITAL_COMMUNITY): Payer: 59 | Admitting: Family

## 2018-05-12 DIAGNOSIS — F322 Major depressive disorder, single episode, severe without psychotic features: Secondary | ICD-10-CM

## 2018-05-12 NOTE — Progress Notes (Signed)
  [facility]  Julie SchimkeXiane S [name] [id number]  Admission date: 05/04/2018 Discharge date: 05/12/2018  Reason for admission: Worsen Depression  Per [facility] assessment [facility]. Pt was inpt due to SI with plan to jump off balcony at apt. Pt reports hx of depression and denies tx hx. Pt reports she has cut herself "a couple of times" since middle school; most recently last week on thighs. Pt states cuts did not need medical attention. Pt reports she dropped out of college and quit her job due to depression symptoms. Pt reports she also was sick "with maybe Mono" and felt weak for a couple of months. Pt reports weight loss from 250 to 218 in a couple of months; pt states she is back to 236lbs. Pt reports decrease in ADLs: Pt is showering about 1x a week; pt not cooking or cleaning. Pt reports lack of supports and identifies mother and father "but I don't talk to them about this stuff" and "maybe one friend." Pt denies HI/AVH and denies plan/intent with SI  Chemical Use History:  Reported intermittent THC use  Family of Origin Issues: Julie FosterXiane continue to reports that her father has been supportive as she has attended Partial Hospitalization Program (PHP) and started Intensive Outpatient ( IOP). Julie FosterXiane has attended 2 day in IOP and has requested to discharge due to "family situation."   Reports she is hopeful to follow at a later date. Continue to deny suicidal or homicidal ideation at discharge. Treatment team to provided additional resources    Progress in Program Toward Treatment Goals: Ongoing, Patient has attended daily group session, with prompted participation. [facility] NP discontinued Trazodone due to reports of her father is taken her medications.    Progress (rationale): Patient to keep follow-up with Psychiatrist Julie [name] on 07/04/18@ 11 am and Julie [name], LCAS on 05/24/2018    Take all medications as prescribed. Keep all follow-up appointments as scheduled.  Do not consume alcohol or use illegal drugs while on prescription medications. Report any adverse effects from your medications to your primary care provider promptly.  In the event of recurrent symptoms or worsening symptoms, call 911, a crisis hotline, or go to the nearest emergency department for evaluation.     Julie Rackanika N Lewis, NP 05/12/2018

## 2018-05-12 NOTE — Patient Instructions (Addendum)
D:  Patient completed MH-IOP today.  A: Follow up with Dr. Lolly MustacheArfeen on 07-04-18 @ 11 a.m and Idalia NeedleBeth MacKenzie, LCAS on 05-24-18.  Encouraged support groups.  R:  Pt receptive.

## 2018-05-12 NOTE — Progress Notes (Signed)
Julie [name] is a 17 y.o. , single, employed, African American female:  As per previous [facility] note states:  [facility] Pt was inpt due to SI with plan to jump off balcony at apt. Pt reports hx of depression and denies tx hx. Pt reports she has cut herself "a couple of times" since middle school; most recently last week on thighs. Pt states cuts did not need medical attention. Pt reports she dropped out of college and quit her job due to depression symptoms. Pt reports she also was sick "with maybe Mono" and felt weak for a couple of months. Pt reports weight loss from 250 to 218 in a couple of months; pt states she is back to 236lbs. Pt reports decrease in ADLs: Pt is showering about 1x a week; pt not cooking or cleaning. Pt reports lack of supports and identifies mother and father "but I don't talk to them about this stuff" and "maybe one friend." Pt denies HI/AVH and denies plan/intent with SI. Pt transitioned to MH-IOP today.  Reports continued depressive symptoms.  Admits to passive on and off SI (denies a plan or intent).  Pt states she learned some skills in PHP.  Pt inquiring about aftercare plans. States she is currently residing with her father.  CC: other notes for hx Pt has only attended four days.  Pt has been non-compliant with attendance.  Reports feeling less depressed.  Denies SI/HI or A/V hallucinations. A:  D/C today.  F/U with Dr. Lolly MustacheArfeen on 07-04-18 @ 11 a.m and Idalia NeedleBeth MacKenzie, LCAS on 05-24-18.  Provided pt to  [facility].  Encouraged support groups.  R:  Pt receptive.         Chestine SporeLARK, RITA, M.Ed,CNA

## 2018-05-13 ENCOUNTER — Encounter (HOSPITAL_COMMUNITY): Payer: Self-pay | Admitting: Family

## 2018-05-15 ENCOUNTER — Other Ambulatory Visit (HOSPITAL_COMMUNITY): Payer: 59

## 2018-05-16 ENCOUNTER — Other Ambulatory Visit (HOSPITAL_COMMUNITY): Payer: 59

## 2018-05-17 ENCOUNTER — Other Ambulatory Visit (HOSPITAL_COMMUNITY): Payer: 59

## 2018-05-24 ENCOUNTER — Ambulatory Visit (HOSPITAL_COMMUNITY): Payer: 59 | Admitting: Licensed Clinical Social Worker

## 2018-06-01 ENCOUNTER — Ambulatory Visit (INDEPENDENT_AMBULATORY_CARE_PROVIDER_SITE_OTHER): Payer: 59 | Admitting: Licensed Clinical Social Worker

## 2018-06-01 ENCOUNTER — Other Ambulatory Visit (HOSPITAL_COMMUNITY): Payer: Self-pay

## 2018-06-01 DIAGNOSIS — F322 Major depressive disorder, single episode, severe without psychotic features: Secondary | ICD-10-CM

## 2018-06-01 DIAGNOSIS — F332 Major depressive disorder, recurrent severe without psychotic features: Secondary | ICD-10-CM

## 2018-06-01 MED ORDER — ESCITALOPRAM OXALATE 10 MG PO TABS: 10.0000 mg | ORAL_TABLET | Freq: Every day | ORAL | 0 refills | Status: DC

## 2018-06-05 NOTE — Progress Notes (Signed)
   THERAPIST PROGRESS NOTE  Session Time: 3pm-4pm  Participation Level: Active  Behavioral Response: CasualAlertAnxious  Type of Therapy: Individual Therapy  Treatment Goals addressed: Coping  Interventions: CBT, Motivational Interviewing and Supportive  Summary: Julie [name] is a 17 y.o. female who presents with Major Depressive Disorder.   Suicidal/Homicidal: Nowithout intent/plan  Therapist Response: Clinician met with client and discussed stressors and supportive skills used since discharge from PHP/IOP programs. Clinician and client created a 'life time line' with major events which impacted client's life. Client shared events related to losing and gaining best friends and how this impacted her socially at different points in her life. Client shared changes in family life and how this effected her living arrangement and how she viewed health and unhealthy relationships. Clinician and client discussed the role religion played in how she was raised and her decision making. Client noted feeling somewhat sheltered at a younger age. Client plans to continue using I-statements learned in group therapy and focus on practicing a mindfulness skill daily.  Plan: Return again in 2 weeks.  Diagnosis: Axis I: Major Depression, Recurrent severe      Olegario Messier, LCSW 06/01/2018

## 2018-06-12 ENCOUNTER — Ambulatory Visit (INDEPENDENT_AMBULATORY_CARE_PROVIDER_SITE_OTHER): Payer: 59 | Admitting: Licensed Clinical Social Worker

## 2018-06-12 DIAGNOSIS — F332 Major depressive disorder, recurrent severe without psychotic features: Secondary | ICD-10-CM

## 2018-06-12 NOTE — Progress Notes (Signed)
   THERAPIST PROGRESS NOTE  Session Time: 4pm-4:45pm  Participation Level: Active  Behavioral Response: Casual and Well GroomedAlertAnxious and Dysphoric  Type of Therapy: Individual Therapy  Treatment Goals addressed: Coping  Interventions: CBT, Motivational Interviewing and Supportive  Summary: Julie [name] is a 17 y.o. female who presents with major depressive disorder.   Suicidal/Homicidal: Nowithout intent/plan  Therapist Response: Clinician met with client, assessing for SI/HI/psychosis and substance use. Clinician inquired about skills used since last assessment. Clinician and client discussed desire to improve relationship with parents including 'deeper' conversations. Client shared family dynamics in conversations identifying most shut down or change the subject. Clinician and client utilized question list to practice continuing to engage in conversation despite feeling uncomfortable. Clinician praised client on sharing completed thoughts. Client identified she does want to participate but over-thinks about the possibility of the response of others. Clinician utilize socratic questioning, summarizing statements, and clarifying questions to maintain conversation with client. Clinician reminded client of previously learned 'I-statements' to help focus conversations on topic, rather than get sidetracked with emotions. Client's goal over the next 2 weeks is to have at least one 'deeper' conversation with each parent.  Plan: Return again in 2 weeks.  Diagnosis: Axis I: Major Depression, Recurrent severe     Olegario Messier, LCSW 06/12/2018

## 2018-06-23 ENCOUNTER — Other Ambulatory Visit (HOSPITAL_COMMUNITY): Payer: Self-pay | Admitting: Family

## 2018-06-23 DIAGNOSIS — F322 Major depressive disorder, single episode, severe without psychotic features: Secondary | ICD-10-CM

## 2018-06-28 DIAGNOSIS — Z0000 Encounter for general adult medical examination without abnormal findings: Secondary | ICD-10-CM | POA: Diagnosis not present

## 2018-06-28 DIAGNOSIS — Z23 Encounter for immunization: Secondary | ICD-10-CM | POA: Diagnosis not present

## 2018-07-03 ENCOUNTER — Other Ambulatory Visit (HOSPITAL_COMMUNITY): Payer: Self-pay | Admitting: Family

## 2018-07-03 ENCOUNTER — Other Ambulatory Visit (HOSPITAL_COMMUNITY): Payer: Self-pay

## 2018-07-03 DIAGNOSIS — F322 Major depressive disorder, single episode, severe without psychotic features: Secondary | ICD-10-CM

## 2018-07-03 MED ORDER — ESCITALOPRAM OXALATE 10 MG PO TABS: 10.0000 mg | ORAL_TABLET | Freq: Every day | ORAL | 0 refills | Status: DC

## 2018-07-04 ENCOUNTER — Ambulatory Visit (INDEPENDENT_AMBULATORY_CARE_PROVIDER_SITE_OTHER): Payer: 59 | Admitting: Psychiatry

## 2018-07-04 ENCOUNTER — Ambulatory Visit (HOSPITAL_COMMUNITY): Payer: 59 | Admitting: Licensed Clinical Social Worker

## 2018-07-04 VITALS — BP 122/70 | Ht 66.0 in | Wt 240.0 lb

## 2018-07-04 DIAGNOSIS — F411 Generalized anxiety disorder: Secondary | ICD-10-CM

## 2018-07-04 DIAGNOSIS — F331 Major depressive disorder, recurrent, moderate: Secondary | ICD-10-CM | POA: Diagnosis not present

## 2018-07-04 MED ORDER — ESCITALOPRAM OXALATE 10 MG PO TABS: 15.0000 mg | ORAL_TABLET | Freq: Every day | ORAL | 1 refills | Status: DC

## 2018-07-04 MED ORDER — HYDROXYZINE HCL 25 MG PO TABS: 25.0000 mg | ORAL_TABLET | Freq: Every day | ORAL | 0 refills | Status: DC | PRN

## 2018-07-04 NOTE — Progress Notes (Signed)
Psychiatric Initial Adult Assessment   Patient Identification: Julie [name] MRN:  [id number] Date of Evaluation:  07/04/2018 Referral Source: PHP  Chief Complaint:  Still have anxiety and nervousness.  Visit Diagnosis:    ICD-10-CM   1. MDD (major depressive disorder), recurrent episode, moderate (HCC) F33.1 escitalopram (LEXAPRO) 10 MG tablet  2. GAD (generalized anxiety disorder) F41.1 escitalopram (LEXAPRO) 10 MG tablet    hydrOXYzine (ATARAX/VISTARIL) 25 MG tablet    History of Present Illness:  Tsou is a 18 year old African-American single currently unemployed college student came to her appointment.  She was referred from [facility].  Patient was admitted in October due to increased depression and having suicidal thoughts and plan to jump off from the balcony of her apartment.  At that time she was under a lot of stress because her parents are getting separated and having argument, she was failing grades and decided to drop the school, moving out from her place and her brother accidentally shot himself under the influence of drugs.  In the hospital she was given Lexapro and hydroxyzine.  She was referred to [facility] and then IOP.  She is doing much better.  She denies any suicidal thoughts or any feeling of hopelessness but continued to endorse anxiety, nervousness and panic attacks.  She is no longer taking hydroxyzine but feel like she need to go back on it.  Sometimes she sleeps fair.  She still have some time racing thoughts.  She wants to go back to school but she is not ready to go back to work.  She used to work at [employer]  Patient denies any mania, psychosis, hallucination.  She admitted weight gain since not able to do job, exercise or walking.  Her parents are separated but living in the same apartment complex.  Patient stays with her father most of the time.  Her brother is also much better and getting help.  Patient told he was lucky because he only got community services other  than any jail time.  Patient denies any mania, aggression, self abusive behavior.  Her energy level is good.  She denies any nightmares, flashback, OCD or any PTSD symptoms.  She is seeing therapist in this office.  Past Psychiatric History: History of psychiatric inpatient and October 2019 due to severe depression and plan to kill herself.  No history of mania, psychosis or any hallucination.  Did PHP and then IOP.  Previous Psychotropic Medications: Yes   Substance Abuse History in the last 12 months:  No.  Consequences of Substance Abuse: Negative  Past Medical History:  Past Medical History:  Diagnosis Date  . Anxiety   . CMV (cytomegalovirus infection) ([place]) 04/13/2018  . Depression   . Dysmenorrhea   . Painful menstrual periods     Past Surgical History:  Procedure Laterality Date  . TONSILLECTOMY      Family Psychiatric History: Reviewed.  Family History:  Family History  Problem Relation Age of Onset  . Hypertension Mother   . Chronic Renal Failure Father   . Depression Father   . Alcohol abuse Maternal Uncle   . Depression Paternal Uncle   . Dementia Maternal Grandmother   . Depression Paternal Grandfather   . Depression Paternal Grandmother     Social History:   Social History   Socioeconomic History  . Marital status: Single    Spouse name: Not on file  . Number of children: Not on file  . Years of education: Not on file  .  Highest education level: Not on file  Occupational History  . Not on file  Social Needs  . Financial resource strain: Not hard at all  . Food insecurity:    Worry: Often true    Inability: Not on file  . Transportation needs:    Medical: No    Non-medical: No  Tobacco Use  . Smoking status: Never Smoker  . Smokeless tobacco: Never Used  Substance and Sexual Activity  . Alcohol use: Never    Frequency: Never  . Drug use: Not Currently    Types: Marijuana    Comment: Has done this twice and last time 1 week prior  .  Sexual activity: Not Currently    Partners: Male    Birth control/protection: None  Lifestyle  . Physical activity:    Days per week: 0 days    Minutes per session: Not on file  . Stress: To some extent  Relationships  . Social connections:    Talks on phone: More than three times a week    Gets together: Once a week    Attends religious service: Never    Active member of club or organization: No    Attends meetings of clubs or organizations: Never    Relationship status: Never married  Other Topics Concern  . Not on file  Social History Narrative   ** Merged History Encounter **        Additional Social History:.  Reviewed.  Allergies:  No Known Allergies  Metabolic Disorder Labs: Recent Results (from the past 2160 hour(s))  CBC     Status: Abnormal   Collection Time: 04/11/18  6:28 AM  Result Value Ref Range   WBC 7.9 4.0 - 10.5 K/uL   RBC 3.90 3.87 - 5.11 MIL/uL   Hemoglobin 9.5 (L) 12.0 - 15.0 g/dL   HCT 31.8 (L) 36.0 - 46.0 %   MCV 81.5 80.0 - 100.0 fL   MCH 24.4 (L) 26.0 - 34.0 pg   MCHC 29.9 (L) 30.0 - 36.0 g/dL   RDW 14.7 11.5 - 15.5 %   Platelets 316 150 - 400 K/uL   nRBC 0.0 0.0 - 0.2 %    Comment: Performed at [facility], [place] [address] [place], [place] [address]  Comprehensive metabolic panel     Status: Abnormal   Collection Time: 04/11/18  6:28 AM  Result Value Ref Range   Sodium 138 135 - 145 mmol/L   Potassium 3.8 3.5 - 5.1 mmol/L   Chloride 110 98 - 111 mmol/L   CO2 23 22 - 32 mmol/L   Glucose, Bld 103 (H) 70 - 99 mg/dL   BUN 7 6 - 20 mg/dL   Creatinine, Ser 0.67 0.44 - 1.00 mg/dL   Calcium 8.5 (L) 8.9 - 10.3 mg/dL   Total Protein 7.1 6.5 - 8.1 g/dL   Albumin 3.1 (L) 3.5 - 5.0 g/dL   AST 11 (L) 15 - 41 U/L   ALT 14 0 - 44 U/L   Alkaline Phosphatase 43 38 - 126 U/L   Total Bilirubin 0.1 (L) 0.3 - 1.2 mg/dL   GFR calc non Af Amer >60 >60 mL/min   GFR calc Af Amer >60 >60 mL/min    Comment: (NOTE) The eGFR has been  calculated using the CKD EPI equation. This calculation has not been validated in all clinical situations. eGFR's persistently <60 mL/min signify possible Chronic Kidney Disease.    Anion gap 5 5 - 15  Comment: Performed at [facility], [place] [address], [place], [place] [address]  Lipid panel     Status: Abnormal   Collection Time: 04/11/18  6:28 AM  Result Value Ref Range   Cholesterol 177 0 - 200 mg/dL   Triglycerides 107 <150 mg/dL   HDL 40 (L) >40 mg/dL   Total CHOL/HDL Ratio 4.4 RATIO   VLDL 21 0 - 40 mg/dL   LDL Cholesterol 116 (H) 0 - 99 mg/dL    Comment:        Total Cholesterol/HDL:CHD Risk Coronary Heart Disease Risk Table                     Men   Women  1/2 Average Risk   3.4   3.3  Average Risk       5.0   4.4  2 X Average Risk   9.6   7.1  3 X Average Risk  23.4   11.0        Use the calculated Patient Ratio above and the CHD Risk Table to determine the patient's CHD Risk.        ATP III CLASSIFICATION (LDL):  <100     mg/dL   Optimal  100-129  mg/dL   Near or Above                    Optimal  130-159  mg/dL   Borderline  160-189  mg/dL   High  >190     mg/dL   Very High Performed at [facility] [address], [place], [place] [address]   TSH     Status: None   Collection Time: 04/11/18  6:28 AM  Result Value Ref Range   TSH 2.682 0.350 - 4.500 uIU/mL    Comment: Performed by a 3rd Generation assay with a functional sensitivity of <=0.01 uIU/mL. Performed at [facility], [place] [address] [place], [place] [address]   Prolactin     Status: Abnormal   Collection Time: 04/11/18  6:28 AM  Result Value Ref Range   Prolactin 38.2 (H) 4.8 - 23.3 ng/mL    Comment: (NOTE) Performed At: [facility] [place], [place] [id number] Rush Farmer MD BM:[id number]   Urinalysis, Routine w reflex microscopic     Status: Abnormal   Collection Time: 04/11/18  2:55 PM  Result Value Ref  Range   Color, Urine AMBER (A) YELLOW    Comment: BIOCHEMICALS MAY BE AFFECTED BY COLOR   APPearance CLOUDY (A) CLEAR   Specific Gravity, Urine 1.021 1.005 - 1.030   pH 6.0 5.0 - 8.0   Glucose, UA NEGATIVE NEGATIVE mg/dL   Hgb urine dipstick MODERATE (A) NEGATIVE   Bilirubin Urine NEGATIVE NEGATIVE   Ketones, ur NEGATIVE NEGATIVE mg/dL   Protein, ur NEGATIVE NEGATIVE mg/dL   Nitrite NEGATIVE NEGATIVE   Leukocytes, UA TRACE (A) NEGATIVE   RBC / HPF 0-5 0 - 5 RBC/hpf   WBC, UA 0-5 0 - 5 WBC/hpf   Bacteria, UA MANY (A) NONE SEEN   Squamous Epithelial / LPF 6-10 0 - 5   Mucus PRESENT    Hyaline Casts, UA PRESENT     Comment: Performed at [facility], [place] [address] [place], [place] [address]  Pregnancy, urine     Status: None   Collection Time: 04/11/18  2:55 PM  Result Value Ref Range   Preg Test, Ur NEGATIVE NEGATIVE  Comment:        THE SENSITIVITY OF THIS METHODOLOGY IS >20 mIU/mL. Performed at [facility], [place] [address], [place], [place] [address]   Urine rapid drug screen (hosp performed)not at [facility]     Status: Abnormal   Collection Time: 04/11/18  2:55 PM  Result Value Ref Range   Opiates NONE DETECTED NONE DETECTED   Cocaine NONE DETECTED NONE DETECTED   Benzodiazepines NONE DETECTED NONE DETECTED   Amphetamines NONE DETECTED NONE DETECTED   Tetrahydrocannabinol POSITIVE (A) NONE DETECTED   Barbiturates NONE DETECTED NONE DETECTED    Comment: (NOTE) DRUG SCREEN FOR MEDICAL PURPOSES ONLY.  IF CONFIRMATION IS NEEDED FOR ANY PURPOSE, NOTIFY LAB WITHIN 5 DAYS. LOWEST DETECTABLE LIMITS FOR URINE DRUG SCREEN Drug Class                     Cutoff (ng/mL) Amphetamine and metabolites    1000 Barbiturate and metabolites    200 Benzodiazepine                 564 Tricyclics and metabolites     300 Opiates and metabolites        300 Cocaine and metabolites        300 THC                            50 Performed at [facility], [place] [address] [place], [place] [address]   Culture, Urine     Status: Abnormal   Collection Time: 04/13/18  6:43 AM  Result Value Ref Range   Specimen Description      URINE, CLEAN CATCH Performed at [facility], [place] [address], [place], [place] [address]    Special Requests      Normal Performed at [facility], [place] [address], [place], [place] [address]    Culture (A)     <10,000 COLONIES/mL INSIGNIFICANT GROWTH Performed at [facility] [address] [place], [place] [address]    Report Status 04/14/2018 FINAL    No results found for: HGBA1C, MPG Lab Results  Component Value Date   PROLACTIN 38.2 (H) 04/11/2018   Lab Results  Component Value Date   CHOL 177 04/11/2018   TRIG 107 04/11/2018   HDL 40 (L) 04/11/2018   CHOLHDL 4.4 04/11/2018   VLDL 21 04/11/2018   LDLCALC 116 (H) 04/11/2018   Lab Results  Component Value Date   TSH 2.682 04/11/2018    Therapeutic Level Labs: No results found for: LITHIUM No results found for: CBMZ No results found for: VALPROATE  Current Medications: Current Outpatient Medications  Medication Sig Dispense Refill  . cetirizine (ZYRTEC) 10 MG tablet Take 10 mg by mouth every morning.  4  . escitalopram (LEXAPRO) 10 MG tablet Take 1.5 tablets (15 mg total) by mouth daily. For depression 45 tablet 1  . hydrOXYzine (ATARAX/VISTARIL) 25 MG tablet Take 1 tablet (25 mg total) by mouth daily as needed for anxiety. 30 tablet 0   No current facility-administered medications for this visit.     Musculoskeletal: Strength & Muscle Tone: within normal limits Gait & Station: normal Patient leans: N/A  Psychiatric Specialty Exam: Review of Systems  Constitutional: Negative for weight loss.  Psychiatric/Behavioral: The patient is nervous/anxious and has insomnia.     Blood pressure 122/70, height 5' 6"  (1.676 m), weight 240 lb (108.9 kg).Body mass index  is 38.74 kg/m.   General Appearance: Casual  Eye Contact:  Fair  Speech:  Slow  Volume:  Normal  Mood:  Anxious and Dysphoric  Affect:  Congruent  Thought Process:  Goal Directed  Orientation:  Full (Time, Place, and Person)  Thought Content:  Logical  Suicidal Thoughts:  No  Homicidal Thoughts:  No  Memory:  Immediate;   Good Recent;   Good Remote;   Good  Judgement:  Good  Insight:  Good  Psychomotor Activity:  Normal  Concentration:  Concentration: Fair and Attention Span: Fair  Recall:  Good  Fund of Knowledge:Good  Language: Good  Akathisia:  No  Handed:  Right  AIMS (if indicated):  not done  Assets:  Communication Skills Desire for Improvement Housing Resilience Social Support  ADL's:  Intact  Cognition: WNL  Sleep:  Fair   Screenings: AIMS     Admission (Discharged) from OP Visit from 04/11/2018 in [place] [facility]  AIMS Total Score  0    AUDIT     Admission (Discharged) from OP Visit from 04/11/2018 in [place] [facility]  Alcohol Use Disorder Identification Test Final Score (AUDIT)  0    GAD-7     Counselor from 05/03/2018 in [place] Counselor from 04/19/2018 in [place]  Total GAD-7 Score  7  9    PHQ2-9     Counselor from 05/03/2018 in [place] Most recent reading at 05/03/2018  9:00 AM Counselor from 04/18/2018 in [place] Most recent reading at 04/18/2018 12:51 PM Counselor from 04/19/2018 in [place] Most recent reading at 04/18/2018  9:00 AM  PHQ-2 Total Score  3  4  6   PHQ-9 Total Score  12  15  17       Assessment and Plan: Patient is a 18 year old African-American college student referred from [facility] for the management of her depression and anxiety symptoms.  I reviewed records, notes, collateral  information, blood work and current medication.  She still struggle with anxiety and depression.  Recommended to try Lexapro 15 mg daily.  Restart hydroxyzine 25 mg which was helping her anxiety and sleep.  There is to continue therapy in this office with [facility].  Discuss safety concerns at any time having active suicidal thoughts or homicidal thought and she need to call 911 or go to local emergency room.  Discussed healthy lifestyle and watch her calorie intake.  Patient gained 17 pounds since October.  Recommended to call us back if she has any question or any concern.  Follow-up in 6 weeks to 8 weeks.   Kathlee Nations, MD 1/14/202011:33 AM

## 2018-07-18 ENCOUNTER — Ambulatory Visit (HOSPITAL_COMMUNITY): Payer: 59 | Admitting: Licensed Clinical Social Worker

## 2018-07-20 ENCOUNTER — Ambulatory Visit (HOSPITAL_COMMUNITY): Payer: 59 | Admitting: Licensed Clinical Social Worker

## 2018-08-01 DIAGNOSIS — R6884 Jaw pain: Secondary | ICD-10-CM | POA: Diagnosis not present

## 2018-08-03 ENCOUNTER — Ambulatory Visit (HOSPITAL_COMMUNITY): Payer: 59 | Admitting: Licensed Clinical Social Worker

## 2018-08-20 ENCOUNTER — Other Ambulatory Visit (HOSPITAL_COMMUNITY): Payer: Self-pay | Admitting: Family

## 2018-08-20 DIAGNOSIS — F411 Generalized anxiety disorder: Secondary | ICD-10-CM

## 2018-08-20 DIAGNOSIS — F331 Major depressive disorder, recurrent, moderate: Secondary | ICD-10-CM

## 2018-08-28 ENCOUNTER — Ambulatory Visit (INDEPENDENT_AMBULATORY_CARE_PROVIDER_SITE_OTHER): Payer: 59 | Admitting: Psychiatry

## 2018-08-28 ENCOUNTER — Encounter (HOSPITAL_COMMUNITY): Payer: Self-pay | Admitting: Psychiatry

## 2018-08-28 VITALS — BP 137/80 | HR 81 | Ht 66.0 in | Wt 251.0 lb

## 2018-08-28 DIAGNOSIS — F411 Generalized anxiety disorder: Secondary | ICD-10-CM | POA: Diagnosis not present

## 2018-08-28 DIAGNOSIS — F331 Major depressive disorder, recurrent, moderate: Secondary | ICD-10-CM

## 2018-08-28 MED ORDER — ESCITALOPRAM OXALATE 20 MG PO TABS: 20.0000 mg | ORAL_TABLET | Freq: Every day | ORAL | 2 refills | Status: DC

## 2018-08-28 MED ORDER — HYDROXYZINE HCL 25 MG PO TABS: 25.0000 mg | ORAL_TABLET | Freq: Every day | ORAL | 0 refills | Status: DC | PRN

## 2018-08-28 NOTE — Progress Notes (Signed)
BH MD/PA/NP OP Progress Note  08/28/2018 9:20 AM Julie [name]  MRN:  [id number]  Chief Complaint: Feeling better with increase Lexapro.  I still have anxiety and depression.  HPI: Patient came for her appointment.  She is taking Lexapro 50 mg which was increased on her last visit.  She still have anxiety and depression but no more suicidal thoughts.  She resume her school.  She started going to [employer] [facility] [employer].  She has no tremors shakes or any EPS.  She takes hydroxyzine as needed when she is very anxious.  Her sleep is improved.  She denies any irritability, anger, mania or any psychosis.  She admitted missing appointment with [facility] but like to resume soon.  She denies any crying spells or any feeling of hopelessness or worthlessness.  She is open to go up on Lexapro to 20 mg since it is working well.  Her energy level is okay.  She denies drinking but occasional smokes marijuana as a recreational.  Visit Diagnosis:    ICD-10-CM   1. MDD (major depressive disorder), recurrent episode, moderate (HCC) F33.1 escitalopram (LEXAPRO) 20 MG tablet  2. GAD (generalized anxiety disorder) F41.1 escitalopram (LEXAPRO) 20 MG tablet    hydrOXYzine (ATARAX/VISTARIL) 25 MG tablet    Past Psychiatric History: Reviewed H/O inpatient in October 2019 due to severe depression and plan to kill herself.  Did PHP and IOP. No h/o mania, psychosis or any hallucination.    Past Medical History:  Past Medical History:  Diagnosis Date  . Anxiety   . CMV (cytomegalovirus infection) ([facility]) 04/13/2018  . Depression   . Dysmenorrhea   . Painful menstrual periods     Past Surgical History:  Procedure Laterality Date  . TONSILLECTOMY      Family Psychiatric History: Reviewed.  Family History:  Family History  Problem Relation Age of Onset  . Hypertension Mother   . Chronic Renal Failure Father   . Depression Father   . Alcohol abuse Maternal Uncle   . Depression Paternal Uncle   . Dementia Maternal  Grandmother   . Depression Paternal Grandfather   . Depression Paternal Grandmother     Social History:  Social History   Socioeconomic History  . Marital status: Single    Spouse name: Not on file  . Number of children: Not on file  . Years of education: Not on file  . Highest education level: Not on file  Occupational History  . Not on file  Social Needs  . Financial resource strain: Not hard at all  . Food insecurity:    Worry: Often true    Inability: Not on file  . Transportation needs:    Medical: No    Non-medical: No  Tobacco Use  . Smoking status: Never Smoker  . Smokeless tobacco: Never Used  Substance and Sexual Activity  . Alcohol use: Never    Frequency: Never  . Drug use: Not Currently    Types: Marijuana    Comment: Has done this twice and last time 1 week prior  . Sexual activity: Not Currently    Partners: Male    Birth control/protection: None  Lifestyle  . Physical activity:    Days per week: 0 days    Minutes per session: Not on file  . Stress: To some extent  Relationships  . Social connections:    Talks on phone: More than three times a week    Gets together: Once a week    Attends religious  service: Never    Active member of club or organization: No    Attends meetings of clubs or organizations: Never    Relationship status: Never married  Other Topics Concern  . Not on file  Social History Narrative   ** Merged History Encounter **        Allergies: No Known Allergies  Metabolic Disorder Labs: No results found for: HGBA1C, MPG Lab Results  Component Value Date   PROLACTIN 38.2 (H) 04/11/2018   Lab Results  Component Value Date   CHOL 177 04/11/2018   TRIG 107 04/11/2018   HDL 40 (L) 04/11/2018   CHOLHDL 4.4 04/11/2018   VLDL 21 04/11/2018   LDLCALC 116 (H) 04/11/2018   Lab Results  Component Value Date   TSH 2.682 04/11/2018    Therapeutic Level Labs: No results found for: LITHIUM No results found for:  VALPROATE No components found for:  CBMZ  Current Medications: Current Outpatient Medications  Medication Sig Dispense Refill  . cetirizine (ZYRTEC) 10 MG tablet Take 10 mg by mouth every morning.  4  . escitalopram (LEXAPRO) 10 MG tablet Take 1.5 tablets (15 mg total) by mouth daily. For depression 45 tablet 1  . hydrOXYzine (ATARAX/VISTARIL) 25 MG tablet Take 1 tablet (25 mg total) by mouth daily as needed for anxiety. 30 tablet 0   No current facility-administered medications for this visit.      Musculoskeletal: Strength & Muscle Tone: within normal limits Gait & Station: normal Patient leans: N/A  Psychiatric Specialty Exam: ROS  Blood pressure 137/80, pulse 81, height  (1.676 m), weight 251 lb (113.9 kg).There is no height or weight on file to calculate BMI.  General Appearance: Casual  Eye Contact:  Fair  Speech:  Clear and Coherent  Volume:  Normal  Mood:  Anxious and Dysphoric  Affect:  Appropriate  Thought Process:  Goal Directed  Orientation:  Full (Time, Place, and Person)  Thought Content: Logical   Suicidal Thoughts:  No  Homicidal Thoughts:  No  Memory:  Immediate;   Good Recent;   Good Remote;   Good  Judgement:  Good  Insight:  Good  Psychomotor Activity:  Normal  Concentration:  Concentration: Fair and Attention Span: Fair  Recall:  Good  Fund of Knowledge: Good  Language: Good  Akathisia:  No  Handed:  Right  AIMS (if indicated): not done  Assets:  Communication Skills Desire for Improvement Housing Vocational/Educational  ADL's:  Intact  Cognition: WNL  Sleep:  Fair   Screenings: AIMS     Ad[facility] ([facility]  AIMS Total Score  0    AUDIT     Admission (Discharged) from [facility]  Alcohol Use Disorder Identification Test Final Score (AUDIT)  0    GAD-7     Counselor from 05/03/2018 in  [facility] Counselor from 04/19/2018 in [facility]  Total GAD-7 Score  7  9    PHQ2-9     Counselor from 05/03/2018 in [facility] Most recent reading at 05/03/2018  9:00 AM Counselor from 04/18/2018 in [facility] Most recent reading at 04/18/2018 12:51 PM Counselor from 04/19/2018 in [facility] Most recent reading at 04/18/2018  9:00 AM  PHQ-2 Total Score  PHQ-9 Total Score  12  15  17        Assessment and Plan: Major depressive disorder, recurrent.  Generalized anxiety disorder.  Patient overall doing much better since taking the higher dose of Lexapro.  She still have residual anxiety and sometimes she gets depressed.  She is open to try higher dose of Lexapro.  Recommended to try Lexapro 20 mg daily and take hydroxyzine 25 mg as needed for severe anxiety.  Encouraged to restart therapy with [facility].  Recommended to call us back if she is any question or any concern.  I will see her again in 3 months.   Cleotis Nipper, MD 08/28/2018, 9:20 AM

## 2018-11-15 ENCOUNTER — Emergency Department (HOSPITAL_COMMUNITY)
Admission: EM | Admit: 2018-11-15 | Discharge: 2018-11-16 | Disposition: A | Discharge status: Home / Self Care | Payer: 59 | Attending: Emergency Medicine | Admitting: Emergency Medicine

## 2018-11-15 ENCOUNTER — Encounter (HOSPITAL_COMMUNITY): Payer: Self-pay | Admitting: Emergency Medicine

## 2018-11-15 ENCOUNTER — Other Ambulatory Visit: Payer: Self-pay

## 2018-11-15 DIAGNOSIS — Z79899 Other long term (current) drug therapy: Secondary | ICD-10-CM | POA: Diagnosis not present

## 2018-11-15 DIAGNOSIS — Z03818 Encounter for observation for suspected exposure to other biological agents ruled out: Secondary | ICD-10-CM | POA: Insufficient documentation

## 2018-11-15 DIAGNOSIS — R509 Fever, unspecified: Secondary | ICD-10-CM | POA: Diagnosis present

## 2018-11-15 LAB — GROUP A STREP BY PCR: Group A Strep by PCR: NOT DETECTED

## 2018-11-15 LAB — URINALYSIS, ROUTINE W REFLEX MICROSCOPIC
Bilirubin Urine: NEGATIVE
Glucose, UA: NEGATIVE mg/dL
Hgb urine dipstick: NEGATIVE
Ketones, ur: NEGATIVE mg/dL
Leukocytes,Ua: NEGATIVE
Nitrite: NEGATIVE
Protein, ur: NEGATIVE mg/dL
Specific Gravity, Urine: 1.017 (ref 1.005–1.030)
pH: 6 (ref 5.0–8.0)

## 2018-11-15 LAB — POC URINE PREG, ED: Preg Test, Ur: NEGATIVE

## 2018-11-15 MED ORDER — ACETAMINOPHEN 325 MG PO TABS
650.0000 mg | ORAL_TABLET | Freq: Once | ORAL | Status: AC | PRN
  Administered 2018-11-15: 22:00:00 650 mg via ORAL
  Filled 2018-11-15: qty 2

## 2018-11-15 NOTE — ED Triage Notes (Signed)
Patient c/o fatigue, headache, sore throat, and body aches today. Febrile in triage. Denies cough, SOB.

## 2018-11-15 NOTE — ED Notes (Signed)
Pelvic supplies at bedside. 

## 2018-11-15 NOTE — ED Provider Notes (Signed)
[facility]-EMERGENCY DEPT Provider Note   CSN: [id number] Arrival date & time: 11/15/18  2113    History   Chief Complaint Chief Complaint  Patient presents with  . Fever    HPI Julie [name] is a 18 y.o. female.     Patient with history of anxiety, previous tonsillectomy --presents to the emergency department with complaint of fever, body aches, sore throat, fatigue starting today.  Patient took ibuprofen for symptoms about noon and was given Tylenol upon arrival to the emergency department.  She denies any chest pain or shortness of breath.  No nausea, vomiting, or diarrhea.  No dysuria, hematuria, increased frequency urgency.  No skin rashes.  Patient also states that she is concerned about sexually transmitted infection as she had unprotected sex which a new partner 2 days ago.  She denies any vaginal discomfort, bleeding, discharge.  No known sick contacts or contacts with COVID-19.  Julie [name] was evaluated in Emergency Department on 11/15/2018 for the symptoms described in the history of present illness. She was evaluated in the context of the global COVID-19 pandemic, which necessitated consideration that the patient might be at risk for infection with the SARS-CoV-2 virus that causes COVID-19. Institutional protocols and algorithms that pertain to the evaluation of patients at risk for COVID-19 are in a state of rapid change based on information released by regulatory bodies including the CDC and federal and state organizations. These policies and algorithms were followed during the patient's care in the ED.      Past Medical History:  Diagnosis Date  . Anxiety   . CMV (cytomegalovirus infection) (HCC) 04/13/2018  . Depression   . Dysmenorrhea   . Painful menstrual periods     Patient Active Problem List   Diagnosis Date Noted  . MDD (major depressive disorder), recurrent episode, severe (HCC) 04/11/2018    Past Surgical History:  Procedure  Laterality Date  . TONSILLECTOMY       OB History   No obstetric history on file.      Home Medications    Prior to Admission medications   Medication Sig Start Date End Date Taking? Authorizing Provider  cetirizine (ZYRTEC) 10 MG tablet Take 10 mg by mouth every morning. 04/23/18   [provider]  escitalopram (LEXAPRO) 20 MG tablet Take 1 tablet (20 mg total) by mouth daily. 08/28/18 08/28/19  Arfeen, Phillips GroutSyed T, MD  hydrOXYzine (ATARAX/VISTARIL) 25 MG tablet Take 1 tablet (25 mg total) by mouth daily as needed for anxiety. 08/28/18   Arfeen, Phillips GroutSyed T, MD  TRI-SPRINTEC 0.18/0.215/0.25 MG-35 MCG tablet TK 1 T PO QD TO MANAGE MENSTRUAL CRAMPS 08/07/18   [provider]    Family History Family History  Problem Relation Age of Onset  . Hypertension Mother   . Chronic Renal Failure Father   . Depression Father   . Alcohol abuse Maternal Uncle   . Depression Paternal Uncle   . Dementia Maternal Grandmother   . Depression Paternal Grandfather   . Depression Paternal Grandmother     Social History Social History   Tobacco Use  . Smoking status: Never Smoker  . Smokeless tobacco: Never Used  Substance Use Topics  . Alcohol use: Never    Frequency: Never  . Drug use: Not Currently    Types: Marijuana    Comment: Has done this twice and last time 1 week prior     Allergies   Patient has no known allergies.   Review  of Systems Review of Systems  Constitutional: Positive for chills, fatigue and fever.  HENT: Positive for sore throat. Negative for rhinorrhea.   Eyes: Negative for redness.  Respiratory: Negative for cough and shortness of breath.   Cardiovascular: Negative for chest pain.  Gastrointestinal: Negative for abdominal pain, diarrhea, nausea and vomiting.  Genitourinary: Negative for dysuria, hematuria, pelvic pain, vaginal bleeding and vaginal discharge.  Musculoskeletal: Negative for myalgias.  Skin: Negative for rash.  Neurological: Negative for  headaches.     Physical Exam Updated Vital Signs BP (!) 153/88   Pulse (!) 120   Temp 100.2 F (37.9 C) (Oral)   Resp (!) 23   SpO2 97%   Physical Exam Vitals signs and nursing note reviewed.  Constitutional:      Appearance: She is well-developed.     Comments: Skin warm to touch  HENT:     Head: Normocephalic and atraumatic.     Jaw: No trismus.     Right Ear: Tympanic membrane, ear canal and external ear normal.     Left Ear: Tympanic membrane, ear canal and external ear normal.     Nose: Nose normal. No mucosal edema or rhinorrhea.     Mouth/Throat:     Mouth: Mucous membranes are not dry. No oral lesions.     Pharynx: Uvula midline. Posterior oropharyngeal erythema present. No oropharyngeal exudate or uvula swelling.     Tonsils: No tonsillar abscesses.  Eyes:     General:        Right eye: No discharge.        Left eye: No discharge.     Conjunctiva/sclera: Conjunctivae normal.  Neck:     Musculoskeletal: Normal range of motion and neck supple.  Cardiovascular:     Rate and Rhythm: Regular rhythm. Tachycardia present.     Heart sounds: Normal heart sounds.  Pulmonary:     Effort: Pulmonary effort is normal. No respiratory distress.     Breath sounds: Normal breath sounds. No wheezing or rales.  Abdominal:     Palpations: Abdomen is soft.     Tenderness: There is no abdominal tenderness.  Lymphadenopathy:     Cervical: No cervical adenopathy.  Skin:    General: Skin is warm and dry.  Neurological:     Mental Status: She is alert.      ED Treatments / Results  Labs (all labs ordered are listed, but only abnormal results are displayed) Labs Reviewed  GROUP A STREP BY PCR  NOVEL CORONAVIRUS, NAA (HOSPITAL ORDER, SEND-OUT TO REF LAB)  URINALYSIS, ROUTINE W REFLEX MICROSCOPIC  POC URINE PREG, ED  GC/CHLAMYDIA PROBE AMP ([place]) NOT AT [facility]    EKG None  Radiology No results found.  Procedures Procedures (including critical care time)   Medications Ordered in ED Medications  acetaminophen (TYLENOL) tablet 650 mg (650 mg Oral Given 11/15/18 2150)     Initial Impression / Assessment and Plan / ED Course  I have reviewed the triage vital signs and the nursing notes.  Pertinent labs & imaging results that were available during my care of the patient were reviewed by me and considered in my medical decision making (see chart for details).        Patient seen and examined. Pt looks well, pending strep. Will send COVID testing if negative, but no indications for admission. No significant respiratory symptoms.   Vital signs reviewed and are as follows: BP (!) 153/88   Pulse (!) 120   Temp  100.2 F (37.9 C) (Oral)   Resp (!) 23   SpO2 97%   Strep test is negative.  UA reviewed.  Pregnancy negative.  Testing for GC/chlamydia is pending.  Patient continues to do well.  No respiratory distress.  Heart rate and temperature improved with treatment.  Given uncertain etiology of her fever today, will order send out COVID testing.  Patient will be discharged home with conservative management.  We discussed isolation until her test results return and if positive, isolating for at least 10 days with no fewer than 3 days of improvement.  Encouraged return to the emergency department if worsening symptoms, difficulty breathing, increased shortness of breath, or other concerns.  She verbalizes understanding agrees with plan.  BP (!) 147/76   Pulse (!) 102   Temp 100.2 F (37.9 C) (Oral)   Resp (!) 25   SpO2 100%     Final Clinical Impressions(s) / ED Diagnoses   Final diagnoses:  Febrile illness   Well-appearing patient with fever, sore throat, body aches starting in the past 12 hours.  No known COVID contacts.  She is not having any chest pain, shortness of breath or cough.  Strep testing negative.  Requesting STI testing however is not currently having any symptoms.  No abdominal pain.  Strep test and urine testing are  negative.  Send out COVID test sent.  Patient is clinically well and feel that she can be discharged with conservative measures, appropriate isolation.  Return instructions as above.  ED Discharge Orders    None       Renne Crigler, Cordelia Poche 11/16/18 Edwyna Perfect, MD 11/16/18 [phone]

## 2018-11-16 NOTE — Discharge Instructions (Signed)
Please read and follow all provided instructions.  Your diagnoses today include:  1. Febrile illness     Tests performed today include:  Strep test - negative  Urine test for UTI - negative  Urine test for STD - pending  COVID test - pending  Vital signs. See below for your results today.   Medications prescribed:  Please take Tylenol and ibuprofen as directed on the packaging for fever and muscle pain.  Hydrate yourself well at home and rest over the next few days.  Take any prescribed medications only as directed.  Home care instructions:  Follow any educational materials contained in this packet.  You should isolate yourself until your COVID-19 test returns.  If this is positive, you need to stay at home and isolate yourself for at least 10 days with 3 days of improvement.  Follow-up instructions: Please follow-up with your primary care provider in the next 3 days for further evaluation of your symptoms.   Return instructions:   Please return to the Emergency Department if you experience worsening symptoms.   Return with any chest pain, shortness of breath.  Please return if you have any other emergent concerns.  Additional Information:  Your vital signs today were: BP (!) 144/80    Pulse (!) 107    Temp 100.2 F (37.9 C) (Oral)    Resp 20    SpO2 99%  If your blood pressure (BP) was elevated above 135/85 this visit, please have this repeated by your doctor within one month. --------------

## 2018-11-17 LAB — NOVEL CORONAVIRUS, NAA (HOSP ORDER, SEND-OUT TO REF LAB; TAT 18-24 HRS): SARS-CoV-2, NAA: NOT DETECTED

## 2018-11-28 ENCOUNTER — Encounter (HOSPITAL_COMMUNITY): Payer: Self-pay | Admitting: Psychiatry

## 2018-11-28 ENCOUNTER — Other Ambulatory Visit: Payer: Self-pay

## 2018-11-28 ENCOUNTER — Ambulatory Visit (INDEPENDENT_AMBULATORY_CARE_PROVIDER_SITE_OTHER): Payer: 59 | Admitting: Psychiatry

## 2018-11-28 DIAGNOSIS — F411 Generalized anxiety disorder: Secondary | ICD-10-CM | POA: Diagnosis not present

## 2018-11-28 DIAGNOSIS — F331 Major depressive disorder, recurrent, moderate: Secondary | ICD-10-CM | POA: Diagnosis not present

## 2018-11-28 MED ORDER — ESCITALOPRAM OXALATE 20 MG PO TABS: 20.0000 mg | ORAL_TABLET | Freq: Every day | ORAL | 2 refills | Status: DC

## 2018-11-28 MED ORDER — HYDROXYZINE HCL 25 MG PO TABS: 25.0000 mg | ORAL_TABLET | Freq: Every day | ORAL | 0 refills | Status: DC | PRN

## 2018-11-28 NOTE — Progress Notes (Signed)
Virtual Visit via Telephone Note  I connected with Julie [name] on 11/28/18 at 10:00 AM EDT by telephone and verified that I am speaking with the correct person using two identifiers.   I discussed the limitations, risks, security and privacy concerns of performing an evaluation and management service by telephone and the availability of in person appointments. I also discussed with the patient that there may be a patient responsible charge related to this service. The patient expressed understanding and agreed to proceed.   History of Present Illness: Patient was evaluated by phone session.  On her last visit we increase Lexapro 20 mg and she is doing very well.  She is pleased that she passed her semester and now she is looking for a part-time job in summer and like to resume her college again for fall.  She is living with her mom who is very supportive.  She denies any crying spells, irritability, feeling of hopelessness or worthlessness.  She is sleeping much better.  She is more hopeful.  She denies any irritability.  Her energy level is good.  Her appetite is okay.  She has no tremors, shakes or any EPS.  She admitted not able to see Carissa due to COVID-19 but like to resume soon once condition improved.  Patient denies drinking.  She has taken few times hydroxyzine that helps with anxiety.  Patient denies any tremors shakes or any EPS.    Past Psychiatric History: Reviewed H/O inpatient in October 2019 due to severe depression and plan to kill herself. Did PHP and IOP. No h/o mania, psychosis or any hallucination.    Psychiatric Specialty Exam: Physical Exam  ROS  There were no vitals taken for this visit.There is no height or weight on file to calculate BMI.  General Appearance: NA  Eye Contact:  NA  Speech:  Clear and Coherent and Normal Rate  Volume:  Normal  Mood:  Euthymic  Affect:  Appropriate  Thought Process:  Goal Directed  Orientation:  Full (Time, Place, and Person)   Thought Content:  Logical  Suicidal Thoughts:  No  Homicidal Thoughts:  No  Memory:  Immediate;   Good Recent;   Good Remote;   Good  Judgement:  Good  Insight:  Good  Psychomotor Activity:  Normal  Concentration:  Concentration: Good and Attention Span: Good  Recall:  Good  Fund of Knowledge:  Good  Language:  Good  Akathisia:  No  Handed:  Right  AIMS (if indicated):     Assets:  Communication Skills Desire for Improvement Housing Resilience Social Support  ADL's:  Intact  Cognition:  WNL  Sleep:         Assessment and Plan: Major depressive disorder, recurrent.  Generalized anxiety disorder.  Patient is doing better on Lexapro 20 mg which was increased on her last visit.  She is taking hydroxyzine as needed for anxiety.  Discussed medication side effects and benefits.  Patient like to resume therapy with Carissa once COVID-19 conditions better.  I will continue Lexapro 20 mg daily and hydroxyzine 25 mg tablet as needed for anxiety.  Recommended to call us back if she has any question, concern or if she feels worsening of the symptoms.  Recommended healthy lifestyle and watch her calorie intake and do regular exercise or walking 10 to 15 minutes 3-4 times a week.  Follow-up in 3 months.  Follow Up Instructions:    I discussed the assessment and treatment plan with the patient. The  patient was provided an opportunity to ask questions and all were answered. The patient agreed with the plan and demonstrated an understanding of the instructions.   The patient was advised to call back or seek an in-person evaluation if the symptoms worsen or if the condition fails to improve as anticipated.  I provided 15 minutes of non-face-to-face time during this encounter.   Kathlee Nations, MD

## 2019-02-28 ENCOUNTER — Ambulatory Visit (INDEPENDENT_AMBULATORY_CARE_PROVIDER_SITE_OTHER): Payer: 59 | Admitting: Psychiatry

## 2019-02-28 ENCOUNTER — Encounter (HOSPITAL_COMMUNITY): Payer: Self-pay | Admitting: Psychiatry

## 2019-02-28 ENCOUNTER — Other Ambulatory Visit: Payer: Self-pay

## 2019-02-28 DIAGNOSIS — F331 Major depressive disorder, recurrent, moderate: Secondary | ICD-10-CM

## 2019-02-28 DIAGNOSIS — F411 Generalized anxiety disorder: Secondary | ICD-10-CM

## 2019-02-28 DIAGNOSIS — F430 Acute stress reaction: Secondary | ICD-10-CM | POA: Diagnosis not present

## 2019-02-28 MED ORDER — ESCITALOPRAM OXALATE 20 MG PO TABS: 20.0000 mg | ORAL_TABLET | Freq: Every day | ORAL | 1 refills | Status: DC

## 2019-02-28 NOTE — Progress Notes (Addendum)
Virtual Visit via Telephone Note  I connected with Julie [name] on 02/28/19 at 10:00 AM EDT by telephone and verified that I am speaking with the correct person using two identifiers.   I discussed the limitations, risks, security and privacy concerns of performing an evaluation and management service by telephone and the availability of in person appointments. I also discussed with the patient that there may be a patient responsible charge related to this service. The patient expressed understanding and agreed to proceed.   History of Present Illness: Patient was evaluated by phone session.  She is under a lot of stress because she was diagnosed with herpes and she regret having the diagnosis.  Patient told it was a consensual relationship but only for 1 time and she is very upset.  She admitted having crying spells, racing thoughts, irritability and anxiety.  She endorsed some time nightmares.  She felt the medicine working but sometime she had a lot of ruminative thoughts about her decision.  She denies passive and fleeting suicidal thoughts but no plan or any intent.  She started school at [employer] with major in media.  Her school is going well but sometimes she has difficulty focusing as things are going in her mind about the diagnosis of herpes.  She does not have any symptoms.  She had decided not to be sexually active anymore.  She is not seeing any therapist but now willing to go back to resume therapy.  She was seen [place] however Ramond Craver is no longer doing individual therapy.  Patient has not taken hydroxyzine as she is sleeping better however I recommend she should take when she is very anxious and cannot sleep.  She denies any paranoia, hallucination, agitation, anger or any feeling of hopelessness or worthlessness.  She lives with her mother.  Recently she moved in a new house which is bigger and has more room.  Patient denies drinking or using any illegal substances.  She has no  tremors shakes or any EPS.  She is compliant with Lexapro 20 mg daily.    Past Psychiatric History:Reviewed H/OinpatientinOctober 2019 due to severe depression and plan to kill herself.Did PHP and IOP.No h/omania, psychosis or any hallucination.    Psychiatric Specialty Exam: Physical Exam  ROS  There were no vitals taken for this visit.There is no height or weight on file to calculate BMI.  General Appearance: NA  Eye Contact:  NA  Speech:  Clear and Coherent and Slow  Volume:  Decreased  Mood:  Anxious and Dysphoric  Affect:  NA  Thought Process:  Goal Directed  Orientation:  Full (Time, Place, and Person)  Thought Content:  Rumination  Suicidal Thoughts:  Passive and fleeting suicidal thoughts but no plan or any intent.  No hallucination.  Homicidal Thoughts:  No  Memory:  Immediate;   Good Recent;   Good Remote;   Good  Judgement:  Fair  Insight:  Good  Psychomotor Activity:  NA  Concentration:  Concentration: Fair and Attention Span: Fair  Recall:  Good  Fund of Knowledge:  Good  Language:  Good  Akathisia:  No  Handed:  Right  AIMS (if indicated):     Assets:  Communication Skills Desire for Improvement Housing Resilience  ADL's:  Intact  Cognition:  WNL  Sleep:   fair/nightmares      Assessment and Plan: Major depressive disorder, recurrent.  Generalized anxiety disorder.  Acute stress disorder   Discuss recent diagnosis of herpes and she  admitted not handling the diagnosis very well.  She admitted some time passive and fleeting suicidal thoughts but no plan or intent.  I strongly encouraged that she should resume therapy deal with the recent trauma which she agreed.  She does not want to change or increase the medication since she feels it is situational stress.  We discussed safety concern that anytime having active suicidal thoughts or homicidal thought then she need to call 911 of the local emergency room.  Continue Lexapro 20 mg daily and  encouraged to take hydroxyzine 25 mg tablet as needed when she is very stressed and unable to sleep.  She has leftover hydroxyzine.  We will provide names of the therapist.  Recommended to call us back if symptoms started to get worse.  Follow-up in 2 months.  Time spent 30 minutes.  Follow Up Instructions:    I discussed the assessment and treatment plan with the patient. The patient was provided an opportunity to ask questions and all were answered. The patient agreed with the plan and demonstrated an understanding of the instructions.   The patient was advised to call back or seek an in-person evaluation if the symptoms worsen or if the condition fails to improve as anticipated.  I provided 30 minutes of non-face-to-face time during this encounter.   Cleotis NipperSyed T Omya Winfield, MD

## 2019-03-13 DIAGNOSIS — T7840XA Allergy, unspecified, initial encounter: Secondary | ICD-10-CM | POA: Insufficient documentation

## 2019-04-30 ENCOUNTER — Ambulatory Visit (HOSPITAL_COMMUNITY): Payer: 59 | Admitting: Psychiatry

## 2019-04-30 ENCOUNTER — Other Ambulatory Visit: Payer: Self-pay

## 2019-05-14 ENCOUNTER — Telehealth (HOSPITAL_COMMUNITY): Payer: Self-pay | Admitting: *Deleted

## 2019-05-14 NOTE — Telephone Encounter (Signed)
Pt left message requesting refill on the Vistaril 25mg . He has an appointment on 12/9.

## 2019-05-15 ENCOUNTER — Telehealth (HOSPITAL_COMMUNITY): Payer: Self-pay

## 2019-05-15 DIAGNOSIS — F411 Generalized anxiety disorder: Secondary | ICD-10-CM

## 2019-05-15 DIAGNOSIS — F331 Major depressive disorder, recurrent, moderate: Secondary | ICD-10-CM

## 2019-05-15 MED ORDER — HYDROXYZINE HCL 25 MG PO TABS: 25.0000 mg | ORAL_TABLET | Freq: Every day | ORAL | 0 refills | Status: DC | PRN

## 2019-05-15 MED ORDER — ESCITALOPRAM OXALATE 20 MG PO TABS: 20.0000 mg | ORAL_TABLET | Freq: Every day | ORAL | 0 refills | Status: DC

## 2019-05-15 NOTE — Telephone Encounter (Signed)
I send refill to pharmacy.

## 2019-05-15 NOTE — Telephone Encounter (Signed)
Medication refill request - Fax received from pt's [facility] for a refill of his prescribed Escitalopram 20 mg tablets, last ordered 02/28/19 + 1 fefill. Pt no showed 04/30/19 but is rescheduled for 05/30/19.

## 2019-05-30 ENCOUNTER — Other Ambulatory Visit: Payer: Self-pay

## 2019-05-30 ENCOUNTER — Encounter (HOSPITAL_COMMUNITY): Payer: Self-pay | Admitting: Psychiatry

## 2019-05-30 ENCOUNTER — Ambulatory Visit (INDEPENDENT_AMBULATORY_CARE_PROVIDER_SITE_OTHER): Payer: 59 | Admitting: Psychiatry

## 2019-05-30 DIAGNOSIS — F331 Major depressive disorder, recurrent, moderate: Secondary | ICD-10-CM

## 2019-05-30 DIAGNOSIS — F411 Generalized anxiety disorder: Secondary | ICD-10-CM | POA: Diagnosis not present

## 2019-05-30 MED ORDER — HYDROXYZINE HCL 25 MG PO TABS: 25.0000 mg | ORAL_TABLET | Freq: Two times a day (BID) | ORAL | 1 refills | Status: DC | PRN

## 2019-05-30 MED ORDER — ESCITALOPRAM OXALATE 20 MG PO TABS: 20.0000 mg | ORAL_TABLET | Freq: Every day | ORAL | 1 refills | Status: DC

## 2019-05-30 NOTE — Progress Notes (Signed)
Virtual Visit via Telephone Note  I connected with Julie [name] on 05/30/19 at  8:40 AM EST by telephone and verified that I am speaking with the correct person using two identifiers.   I discussed the limitations, risks, security and privacy concerns of performing an evaluation and management service by telephone and the availability of in person appointments. I also discussed with the patient that there may be a patient responsible charge related to this service. The patient expressed understanding and agreed to proceed.   History of Present Illness: Patient was evaluated by phone session.  She is taking Lexapro 20 mg every day and she admitted that most of her depression anxiety is resolved but she still has passive and fleeting suicidal thoughts but she has no plan or any intent.  She finished the semester but now she is focusing to get a job.  She notes most of the night she sleeps 7 hours but there are nights when she struggled with sleep and having racing thoughts.  She feels sometimes irritable denies any agitation, anger or any crying spells.  She lives with her mother and hoping to find a job soon so she can live by herself.  Patient told her family does not celebrate holidays so her Thanksgiving was very quiet.  She started taking hydroxyzine most of the days which really helps her anxiety and if she takes at night she sleep better.  She denies drinking or using any illegal substances.  She reported no tremors, shakes or any EPS.  We have recommended to start therapy however patient not able to afford therapy but hoping to start in January she may start therapy.  [facility] and she is hoping to start [facility] starting January.  Her appetite is okay.  Energy level is okay.     Past Psychiatric History:Reviewed H/OinpatientinOctober 2019 due to severe depression and plan to kill herself.Did PHP and IOP.No h/omania, psychosis or any hallucination.    Psychiatric  Specialty Exam: Physical Exam  ROS  There were no vitals taken for this visit.There is no height or weight on file to calculate BMI.  General Appearance: NA  Eye Contact:  NA  Speech:  Clear and Coherent and Slow  Volume:  Decreased  Mood:  Dysphoric  Affect:  NA  Thought Process:  Goal Directed  Orientation:  Full (Time, Place, and Person)  Thought Content:  Rumination  Suicidal Thoughts:  passive and fleeting thoughts but no plan or intent  Homicidal Thoughts:  No  Memory:  Immediate;   Good Recent;   Good Remote;   Good  Judgement:  Intact  Insight:  Present  Psychomotor Activity:  NA  Concentration:  Concentration: Fair and Attention Span: Fair  Recall:  Good  Fund of Knowledge:  Good  Language:  Good  Akathisia:  No  Handed:  Right  AIMS (if indicated):     Assets:  Communication Skills Desire for Improvement Housing Resilience Social Support  ADL's:  Intact  Cognition:  WNL  Sleep:   fair. 3-7 hrs      Assessment and Plan: Major depressive disorder, recurrent.  Generalized anxiety disorder.  I agree that she should resume therapy and now she is [facility].  I also recommend she should take the hydroxyzine every night since it is helping her sleep and take during the day as needed when she is very anxious.  Overall she feels better than before.  I  will recommend to continue Lexapro 20 mg daily.  Discussed medication side effects and benefits.  Recommended to call us back if she has any question or any concern.  Follow-up in 2 months.  Follow Up Instructions:    I discussed the assessment and treatment plan with the patient. The patient was provided an opportunity to ask questions and all were answered. The patient agreed with the plan and demonstrated an understanding of the instructions.   The patient was advised to call back or seek an in-person evaluation if the symptoms worsen or if the condition fails to improve as  anticipated.  I provided 15 minutes of non-face-to-face time during this encounter.   Cleotis Nipper, MD

## 2019-08-22 ENCOUNTER — Telehealth: Payer: Self-pay

## 2019-08-22 DIAGNOSIS — F411 Generalized anxiety disorder: Secondary | ICD-10-CM

## 2019-08-22 DIAGNOSIS — F331 Major depressive disorder, recurrent, moderate: Secondary | ICD-10-CM

## 2019-08-22 MED ORDER — ESCITALOPRAM OXALATE 20 MG PO TABS: 20.0000 mg | ORAL_TABLET | Freq: Every day | ORAL | 0 refills | Status: DC

## 2019-08-22 NOTE — Telephone Encounter (Signed)
Done

## 2019-08-22 NOTE — Telephone Encounter (Signed)
Medication refill - Pt's [facility] requesting a refill of her Escitalopram, last ordered 05/30/19 + 1 refill but pt does not return until 08/28/19.

## 2019-08-28 ENCOUNTER — Encounter (HOSPITAL_COMMUNITY): Payer: Self-pay | Admitting: Psychiatry

## 2019-08-28 ENCOUNTER — Other Ambulatory Visit: Payer: Self-pay

## 2019-08-28 ENCOUNTER — Ambulatory Visit (INDEPENDENT_AMBULATORY_CARE_PROVIDER_SITE_OTHER): Payer: 59 | Admitting: Psychiatry

## 2019-08-28 DIAGNOSIS — F331 Major depressive disorder, recurrent, moderate: Secondary | ICD-10-CM

## 2019-08-28 DIAGNOSIS — F411 Generalized anxiety disorder: Secondary | ICD-10-CM

## 2019-08-28 MED ORDER — ESCITALOPRAM OXALATE 20 MG PO TABS: 20.0000 mg | ORAL_TABLET | Freq: Every day | ORAL | 2 refills | Status: DC

## 2019-08-28 MED ORDER — HYDROXYZINE HCL 25 MG PO TABS: 25.0000 mg | ORAL_TABLET | Freq: Two times a day (BID) | ORAL | 1 refills | Status: DC | PRN

## 2019-08-28 NOTE — Progress Notes (Signed)
Virtual Visit via Telephone Note  I connected with Julie [name] on 08/28/19 at  8:40 AM EST by telephone and verified that I am speaking with the correct person using two identifiers.   I discussed the limitations, risks, security and privacy concerns of performing an evaluation and management service by telephone and the availability of in person appointments. I also discussed with the patient that there may be a patient responsible charge related to this service. The patient expressed understanding and agreed to proceed.   History of Present Illness: Patient was evaluated by phone session.  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  She has no longer suicidal thoughts.  She denies any crying spells but still sometimes struggle with sleep, anxiety and depression.  She is tolerating her medication and denies any side effects.  She admitted weight gain recently because she is eating better and more.  She gained weight since last year.  She takes hydroxyzine almost daily which helps her anxiety but sometimes makes her sleepy.  She denies drinking or using any illegal substances.  Her energy level is improved.  She denies any panic attack.  She does not feel as overwhelmed or anxious lately since started therapy and job.  She is doing part-time work.  She reported her therapist believe that her depression may get worse in winter and there may be a component of seasonal affective disorder.  Past Psychiatric History:Reviewed H/OinpatientinOctober 2019 due to severe depression and plan to kill herself.Did PHP and IOP.No h/omania, psychosis or any hallucination.    Psychiatric Specialty Exam: Physical Exam  Review of Systems  There were no vitals taken for this visit.There is no height or weight on file to calculate BMI.  General Appearance: NA  Eye Contact:  NA  Speech:  Clear and Coherent  Volume:  Normal  Mood:  Dysphoric  Affect:   NA  Thought Process:  Descriptions of Associations: Intact  Orientation:  Full (Time, Place, and Person)  Thought Content:  Rumination  Suicidal Thoughts:  No  Homicidal Thoughts:  No  Memory:  Immediate;   Good Recent;   Good Remote;   Good  Judgement:  Intact  Insight:  Present  Psychomotor Activity:  NA  Concentration:  Concentration: Fair and Attention Span: Fair  Recall:  Good  Fund of Knowledge:  Good  Language:  Good  Akathisia:  No  Handed:  Right  AIMS (if indicated):     Assets:  Communication Skills Desire for Improvement Housing Resilience Social Support  ADL's:  Intact  Cognition:  WNL  Sleep:   better      Assessment and Plan: Major depressive disorder, recurrent.  Generalized anxiety disorder.  Rule out seasonal affective disorder.  I talked with the patient and we discussed stressors which now she is handling better.  She is working, started taking medication every day and therapy.  These things may have contributed to relief her depression and suicidal thoughts.  However she may have a seasonal component but at this time I recommend we need to keep the current medication since she had made progress and we will consider reducing the dose on her next month if patient continued to improve during warm weather.  She agreed with the plan.  Recommended to call us back if she has any question or any concern.  Follow-up in 3 months.  Continue Lexapro 20 mg daily and hydroxyzine 25 mg tablet as  needed for severe anxiety.  Encourage watch her calorie intake and do regular exercise.  She had gained weight in past few months.  Follow Up Instructions:    I discussed the assessment and treatment plan with the patient. The patient was provided an opportunity to ask questions and all were answered. The patient agreed with the plan and demonstrated an understanding of the instructions.   The patient was advised to call back or seek an in-person evaluation if the symptoms  worsen or if the condition fails to improve as anticipated.  I provided 20 minutes of non-face-to-face time during this encounter.   Kathlee Nations, MD

## 2019-09-24 ENCOUNTER — Other Ambulatory Visit: Payer: Self-pay | Admitting: Psychiatry

## 2019-09-24 DIAGNOSIS — F411 Generalized anxiety disorder: Secondary | ICD-10-CM

## 2019-09-24 DIAGNOSIS — F331 Major depressive disorder, recurrent, moderate: Secondary | ICD-10-CM

## 2019-11-21 ENCOUNTER — Encounter (HOSPITAL_COMMUNITY): Payer: Self-pay | Admitting: Psychiatry

## 2019-11-21 ENCOUNTER — Other Ambulatory Visit: Payer: Self-pay

## 2019-11-21 ENCOUNTER — Telehealth (INDEPENDENT_AMBULATORY_CARE_PROVIDER_SITE_OTHER): Payer: 59 | Admitting: Psychiatry

## 2019-11-21 VITALS — Wt 265.0 lb

## 2019-11-21 DIAGNOSIS — F411 Generalized anxiety disorder: Secondary | ICD-10-CM | POA: Diagnosis not present

## 2019-11-21 DIAGNOSIS — F331 Major depressive disorder, recurrent, moderate: Secondary | ICD-10-CM | POA: Diagnosis not present

## 2019-11-21 MED ORDER — BUPROPION HCL ER (SR) 100 MG PO TB12: 100.0000 mg | ORAL_TABLET | Freq: Every day | ORAL | 1 refills | Status: DC

## 2019-11-21 MED ORDER — HYDROXYZINE HCL 25 MG PO TABS: 25.0000 mg | ORAL_TABLET | Freq: Two times a day (BID) | ORAL | 1 refills | Status: DC | PRN

## 2019-11-21 MED ORDER — ESCITALOPRAM OXALATE 20 MG PO TABS: 20.0000 mg | ORAL_TABLET | Freq: Every day | ORAL | 2 refills | Status: DC

## 2019-11-21 NOTE — Progress Notes (Signed)
Virtual Visit via Telephone Note  I connected with Julie [name] on 11/21/19 at  8:40 AM EDT by telephone and verified that I am speaking with the correct person using two identifiers.   I discussed the limitations, risks, security and privacy concerns of performing an evaluation and management service by telephone and the availability of in person appointments. I also discussed with the patient that there may be a patient responsible charge related to this service. The patient expressed understanding and agreed to proceed.  Patient location; home Provider location; home office  History of Present Illness: Patient is evaluated by phone session.  She reported lately feeling more anxious, depressed and having crying spells.  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  She endorsed there are nights when she only sleeps 4 hours and there are nights when she sleeps 8 hours.  She admitted sometimes hopelessness and worried about the future and finances.  However she denies any hallucination, paranoia or any active suicidal thoughts.  She is taking hydroxyzine every day to calm her anxiety.  Even though she like warm weather but has not been able to go out as much because of allergies.  Recently she seen PCP and had an allergy test and she was disappointed that she has been allergic to a lot of things.  She admitted some time no motivation to do many things.  She is actively looking for a new job.  She also stopped therapy because she could not afford.  She lives with her mother and she reported her mother is also going through a lot and feeling overwhelmed.  Her appetite is okay.  She denies drinking or using any illegal substances.  She is compliant with Lexapro and hydroxyzine.  Past Psychiatric History:Reviewed H/OinpatientinOctober 2019 due to severe depression and plan to kill herself.Did PHP and IOP.No h/omania, psychosis or any hallucination.   Psychiatric Specialty  Exam: Physical Exam  Review of Systems  Weight 265 lb (120.2 kg).There is no height or weight on file to calculate BMI.  General Appearance: NA  Eye Contact:  NA  Speech:  Clear and Coherent  Volume:  Decreased  Mood:  Anxious and Depressed  Affect:  NA  Thought Process:  Goal Directed  Orientation:  Full (Time, Place, and Person)  Thought Content:  Rumination  Suicidal Thoughts:  No  Homicidal Thoughts:  No  Memory:  Immediate;   Good Recent;   Good Remote;   Good  Judgement:  Fair  Insight:  Present  Psychomotor Activity:  NA  Concentration:  Concentration: Fair and Attention Span: Fair  Recall:  Good  Fund of Knowledge:  Good  Language:  Good  Akathisia:  Yes  Handed:  Right  AIMS (if indicated):     Assets:  Communication Skills Desire for Improvement Housing Resilience Social Support  ADL's:  Intact  Cognition:  WNL  Sleep:   4-8 hrs      Assessment and Plan: Major depressive disorder, recurrent.  Generalized anxiety disorder.  Rule out seasonal affective disorder.  Discussed stressors including financial burden and not able to get full-time job and living with the mother.  She is not in therapy and now looking for a job so she can keep herself busy.  We talked about adjusting medication and she agreed.  I recommend to try low-dose Wellbutrin and keep the Lexapro 20 mg and hydroxyzine 25 mg at bedtime.  If she feel improvement with the Wellbutrin we  may consider giving therapeutic dose of Wellbutrin and wean her off from Lexapro.  She agreed with the plan.  Recommended to call us back if she has any question or any concern.  I discussed medication side effects specially Wellbutrin that she need to take first thing in the morning.  She will try Wellbutrin 100 mg in the morning.  Follow-up in 6 weeks.  Time spent 20 minutes.  Follow Up Instructions:    I discussed the assessment and treatment plan with the patient. The patient was provided an opportunity to ask  questions and all were answered. The patient agreed with the plan and demonstrated an understanding of the instructions.   The patient was advised to call back or seek an in-person evaluation if the symptoms worsen or if the condition fails to improve as anticipated.  I provided 20 minutes of non-face-to-face time during this encounter.   Cleotis Nipper, MD

## 2019-11-28 ENCOUNTER — Ambulatory Visit (HOSPITAL_COMMUNITY): Payer: 59 | Admitting: Psychiatry

## 2020-01-17 ENCOUNTER — Telehealth (INDEPENDENT_AMBULATORY_CARE_PROVIDER_SITE_OTHER): Payer: 59 | Admitting: Psychiatry

## 2020-01-17 ENCOUNTER — Other Ambulatory Visit: Payer: Self-pay

## 2020-01-17 ENCOUNTER — Encounter (HOSPITAL_COMMUNITY): Payer: Self-pay | Admitting: Psychiatry

## 2020-01-17 DIAGNOSIS — F331 Major depressive disorder, recurrent, moderate: Secondary | ICD-10-CM

## 2020-01-17 DIAGNOSIS — F411 Generalized anxiety disorder: Secondary | ICD-10-CM

## 2020-01-17 MED ORDER — ESCITALOPRAM OXALATE 20 MG PO TABS: 20.0000 mg | ORAL_TABLET | Freq: Every day | ORAL | 2 refills | Status: DC

## 2020-01-17 MED ORDER — BUPROPION HCL ER (SR) 100 MG PO TB12: 100.0000 mg | ORAL_TABLET | Freq: Every day | ORAL | 2 refills | Status: DC

## 2020-01-17 NOTE — Progress Notes (Signed)
Virtual Visit via Telephone Note  I connected with Julie [name] on 01/17/20 at  9:00 AM EDT by telephone and verified that I am speaking with the correct person using two identifiers.  Location: Patient: home Provider: home office   I discussed the limitations, risks, security and privacy concerns of performing an evaluation and management service by telephone and the availability of in person appointments. I also discussed with the patient that there may be a patient responsible charge related to this service. The patient expressed understanding and agreed to proceed.   History of Present Illness: Patient is evaluated by phone session.  We started her Wellbutrin as added on on the last visit and she noticed much improvement in her mood, energy level, motivation.  She is up in the morning and she is active.  She has not able to find a job but actively looking for job.  She denies any crying spells or any feeling of hopelessness or worthlessness.  Her sleep is improved and good.  She lives with her mother.  She is also taking Lexapro and occasionally hydroxyzine.  She does not feel overwhelmed or having any major anxiety attack.  She has no tremors, shakes or any EPS.  She denies any paranoia or any hallucination.  She like to keep her current medication.  Past Psychiatric History:Reviewed H/OinpatientinOctober 2019 due to severe depression and plan to kill herself.Did PHP and IOP.No h/omania, psychosis or any hallucination.    Psychiatric Specialty Exam: Physical Exam  Review of Systems  Weight (!) 267 lb (121.1 kg).There is no height or weight on file to calculate BMI.  General Appearance: NA  Eye Contact:  NA  Speech:  Clear and Coherent  Volume:  Normal  Mood:  Euthymic  Affect:  NA  Thought Process:  Goal Directed  Orientation:  Full (Time, Place, and Person)  Thought Content:  Logical  Suicidal Thoughts:  No  Homicidal Thoughts:  No  Memory:  Immediate;    Good Recent;   Good Remote;   Good  Judgement:  Good  Insight:  Present  Psychomotor Activity:  NA  Concentration:  Concentration: Good and Attention Span: Good  Recall:  Good  Fund of Knowledge:  Good  Language:  Good  Akathisia:  No  Handed:  Right  AIMS (if indicated):     Assets:  Communication Skills Desire for Improvement Housing Resilience Social Support  ADL's:  Intact  Cognition:  WNL  Sleep:         Assessment and Plan: Major depressive disorder, recurrent.  Generalized anxiety disorder.  Patient is doing better with addition of low-dose Wellbutrin.  She has no tremors shakes or any EPS.  She has not started therapy and now actively looking for a job.  Discussed medication side effects and benefits.  Continue Wellbutrin 100 mg in the morning and Lexapro 20 mg daily.  She does not need the hydroxyzine at this time but promised to call us back if she needed in the future.  Recommended to call us back if she has any question or any concern.  Follow-up in 3 months.  Follow Up Instructions:    I discussed the assessment and treatment plan with the patient. The patient was provided an opportunity to ask questions and all were answered. The patient agreed with the plan and demonstrated an understanding of the instructions.   The patient was advised to call back or seek an in-person evaluation if the symptoms worsen or if the condition  fails to improve as anticipated.  I provided 13 minutes of non-face-to-face time during this encounter.   Cleotis Nipper, MD

## 2020-01-20 ENCOUNTER — Other Ambulatory Visit (HOSPITAL_COMMUNITY): Payer: Self-pay | Admitting: Psychiatry

## 2020-01-20 DIAGNOSIS — F411 Generalized anxiety disorder: Secondary | ICD-10-CM

## 2020-01-20 DIAGNOSIS — F331 Major depressive disorder, recurrent, moderate: Secondary | ICD-10-CM

## 2020-04-16 ENCOUNTER — Telehealth (HOSPITAL_COMMUNITY): Payer: 59 | Admitting: Psychiatry

## 2020-04-17 ENCOUNTER — Encounter (HOSPITAL_COMMUNITY): Payer: Self-pay | Admitting: Psychiatry

## 2020-04-17 ENCOUNTER — Other Ambulatory Visit: Payer: Self-pay

## 2020-04-17 ENCOUNTER — Telehealth (INDEPENDENT_AMBULATORY_CARE_PROVIDER_SITE_OTHER): Payer: 59 | Admitting: Psychiatry

## 2020-04-17 VITALS — Wt 265.0 lb

## 2020-04-17 DIAGNOSIS — F419 Anxiety disorder, unspecified: Secondary | ICD-10-CM | POA: Diagnosis not present

## 2020-04-17 DIAGNOSIS — F331 Major depressive disorder, recurrent, moderate: Secondary | ICD-10-CM | POA: Diagnosis not present

## 2020-04-17 MED ORDER — BUPROPION HCL ER (SR) 100 MG PO TB12: 100.0000 mg | ORAL_TABLET | Freq: Every day | ORAL | 2 refills | Status: DC

## 2020-04-17 MED ORDER — ESCITALOPRAM OXALATE 20 MG PO TABS: 20.0000 mg | ORAL_TABLET | Freq: Every day | ORAL | 2 refills | Status: DC

## 2020-04-17 NOTE — Progress Notes (Signed)
Virtual Visit via Telephone Note  I connected with Julie [name] on 04/17/20 at 10:20 AM EDT by telephone and verified that I am speaking with the correct person using two identifiers.  Location: Patient: home Provider: home office   I discussed the limitations, risks, security and privacy concerns of performing an evaluation and management service by telephone and the availability of in person appointments. I also discussed with the patient that there may be a patient responsible charge related to this service. The patient expressed understanding and agreed to proceed.   History of Present Illness: Patient is evaluated by phone session.  She is taking Wellbutrin and Lexapro and she feels it is helping her anxiety and depression.  She started working part-time at [employer] from home and she is very happy with the job.  She is hoping that if she is more comfortable with the job she may work full-time.  Patient told job is keeping her busy and she does not feel as anxious depressed and denies any recent crying spells or any feeling of hopelessness.  However she still struggle with sleep some time and her mother noticed that she snores and she is thinking about to get sleep study.  She had sleep study few years ago but it was inconclusive.  She takes hydroxyzine once in a while which helps her sleep but she like to find out if she has sleep apnea.  Her weight is unchanged from the past.  She has no tremors, shakes or any EPS.  She wants to keep her current medication.  She has not started therapy but she promised if she want to then she will give [place] a call.   Past Psychiatric History: H/OinpatientinOctober 2019 due to severe depression and plan to kill herself.Did PHP and IOP.No h/omania, psychosis or any hallucination.   Psychiatric Specialty Exam: Physical Exam  Review of Systems  Weight 265 lb (120.2 kg).There is no height or weight on file to calculate BMI.   General Appearance: NA  Eye Contact:  NA  Speech:  Clear and Coherent  Volume:  Normal  Mood:  Euthymic  Affect:  NA  Thought Process:  Goal Directed  Orientation:  Full (Time, Place, and Person)  Thought Content:  WDL  Suicidal Thoughts:  No  Homicidal Thoughts:  No  Memory:  Immediate;   Good Recent;   Good Remote;   Good  Judgement:  Good  Insight:  Present  Psychomotor Activity:  NA  Concentration:  Concentration: Good and Attention Span: Good  Recall:  Good  Fund of Knowledge:  Good  Language:  Good  Akathisia:  No  Handed:  Right  AIMS (if indicated):     Assets:  Communication Skills Desire for Improvement Housing Resilience Social Support Transportation  ADL's:  Intact  Cognition:  WNL  Sleep:   fair      Assessment and Plan: Major depressive disorder, recurrent.  Anxiety.  Patient is a stable on her current medication.  She is taking Wellbutrin 100 mg in the morning and Lexapro 20 mg daily.  She is hoping to get sleep study as some nights she struggled with insomnia and her mother noticed snoring.  We discussed sleep hygiene.  Patient does not want to change the medication.  She is happy with the job and staying with her mother.  Continue Wellbutrin 100 mg in the morning and Lexapro 20 mg daily.  She does not need a new prescription of hydroxyzine.  Recommended to call us back if she has any question or any concern.  Follow-up in 3 months.  Follow Up Instructions:    I discussed the assessment and treatment plan with the patient. The patient was provided an opportunity to ask questions and all were answered. The patient agreed with the plan and demonstrated an understanding of the instructions.   The patient was advised to call back or seek an in-person evaluation if the symptoms worsen or if the condition fails to improve as anticipated.  I provided 12 minutes of non-face-to-face time during this encounter.   Cleotis Nipper, MD

## 2020-04-21 ENCOUNTER — Other Ambulatory Visit (HOSPITAL_COMMUNITY): Payer: Self-pay | Admitting: Psychiatry

## 2020-04-21 DIAGNOSIS — F419 Anxiety disorder, unspecified: Secondary | ICD-10-CM

## 2020-04-21 DIAGNOSIS — F331 Major depressive disorder, recurrent, moderate: Secondary | ICD-10-CM

## 2020-07-14 ENCOUNTER — Encounter (HOSPITAL_COMMUNITY): Payer: Self-pay | Admitting: Psychiatry

## 2020-07-14 ENCOUNTER — Telehealth (INDEPENDENT_AMBULATORY_CARE_PROVIDER_SITE_OTHER): Payer: Self-pay | Admitting: Psychiatry

## 2020-07-14 ENCOUNTER — Other Ambulatory Visit: Payer: Self-pay

## 2020-07-14 VITALS — Wt 264.0 lb

## 2020-07-14 DIAGNOSIS — F331 Major depressive disorder, recurrent, moderate: Secondary | ICD-10-CM

## 2020-07-14 DIAGNOSIS — F419 Anxiety disorder, unspecified: Secondary | ICD-10-CM

## 2020-07-14 MED ORDER — BUPROPION HCL ER (XL) 150 MG PO TB24: 150.0000 mg | ORAL_TABLET | Freq: Every day | ORAL | 2 refills | Status: DC

## 2020-07-14 MED ORDER — ESCITALOPRAM OXALATE 20 MG PO TABS: 20.0000 mg | ORAL_TABLET | Freq: Every day | ORAL | 2 refills | Status: DC

## 2020-07-14 NOTE — Addendum Note (Signed)
Addended by: Kathryne Sharper T on: 07/14/2020 09:08 AM   Modules accepted: Orders

## 2020-07-14 NOTE — Progress Notes (Signed)
Virtual Visit via Telephone Note  I connected with Julie [name] on 07/14/20 at  8:40 AM EST by telephone and verified that I am speaking with the correct person using two identifiers.  Location: Patient: Home Provider: Home Office   I discussed the limitations, risks, security and privacy concerns of performing an evaluation and management service by telephone and the availability of in person appointments. I also discussed with the patient that there may be a patient responsible charge related to this service. The patient expressed understanding and agreed to proceed.   History of Present Illness: Patient is evaluated by phone session.  She is taking her medication but sometimes she feels lack of motivation, concentration and difficulty making executive functions.  She recently switch her job and now she is working flexible hours at [employer].  Patient told her previous job at [employer] and that was very strict and she has no flexibility.  Now she is working 15 hours per job hours are flexible.  Patient told she had a quite Christmas.  She stays with her mother.  She has not done sleep study but noticed her sleep is better.  Her weight is stable.  She has no tremors shakes or any EPS.  Occasionally she takes hydroxyzine and she feels very nervous and anxious and that seems to be working.  She has not schedule appointment with therapist but promised if needed then she will contact her therapist.  She has no tremors.  She is taking Wellbutrin and Lexapro.  Past Psychiatric History: H/OinpatientinOctober 2019 due to severe depression and plan to kill herself.Did PHP and IOP.No h/omania, psychosis or any hallucination.    Psychiatric Specialty Exam: Physical Exam  Review of Systems  Weight 264 lb (119.7 kg).There is no height or weight on file to calculate BMI.  General Appearance: NA  Eye Contact:  NA  Speech:  Clear and Coherent  Volume:  Normal  Mood:  Dysphoric  Affect:   NA  Thought Process:  Goal Directed  Orientation:  Full (Time, Place, and Person)  Thought Content:  Rumination  Suicidal Thoughts:  No  Homicidal Thoughts:  No  Memory:  Immediate;   Good Recent;   Good Remote;   Fair  Judgement:  Intact  Insight:  Present  Psychomotor Activity:  NA  Concentration:  Concentration: Fair and Attention Span: Fair  Recall:  [employer] of Knowledge:  Good  Language:  Good  Akathisia:  No  Handed:  Right  AIMS (if indicated):     Assets:  Communication Skills Desire for Improvement Housing Resilience Social Support Transportation  ADL's:  Intact  Cognition:  WNL  Sleep:   good      Assessment and Plan: Major depressive disorder, recurrent.  Anxiety.  Discussed current medication.  Recommended to try Wellbutrin XL 150 rather than Wellbutrin SR 100 mg morning.  Continue Lexapro 20 mg daily.  Her sleep is better but she still struggle with attention concentration.  She is still have leftover hydroxyzine and does not need any prescription.  Reminded to schedule therapist appointment to help her coping skills.  Patient promised that she will do if needed.  Recommended to call us back if is any question or any concern.  Follow-up in 3 months.    Follow Up Instructions:    I discussed the assessment and treatment plan with the patient. The patient was provided an opportunity to ask questions and all were answered. The patient agreed with the plan and  demonstrated an understanding of the instructions.   The patient was advised to call back or seek an in-person evaluation if the symptoms worsen or if the condition fails to improve as anticipated.  I provided 17 minutes of non-face-to-face time during this encounter.   Cleotis Nipper, MD

## 2020-07-24 ENCOUNTER — Other Ambulatory Visit (HOSPITAL_COMMUNITY): Payer: Self-pay | Admitting: Psychiatry

## 2020-07-24 DIAGNOSIS — F419 Anxiety disorder, unspecified: Secondary | ICD-10-CM

## 2020-07-24 DIAGNOSIS — F331 Major depressive disorder, recurrent, moderate: Secondary | ICD-10-CM

## 2020-07-28 ENCOUNTER — Telehealth (HOSPITAL_COMMUNITY): Payer: Self-pay | Admitting: *Deleted

## 2020-07-28 NOTE — Telephone Encounter (Signed)
Received call from patient who stated that she did not have refills for Lexapro or Wellbutrin.  Chart has them e scripted on 07/14/20.  Placed call to pharmacy, who did not receive orders.  Verbal given.

## 2020-07-28 NOTE — Telephone Encounter (Signed)
ok 

## 2020-10-08 ENCOUNTER — Encounter (HOSPITAL_COMMUNITY): Payer: Self-pay | Admitting: Psychiatry

## 2020-10-08 ENCOUNTER — Telehealth (HOSPITAL_COMMUNITY): Payer: Self-pay | Admitting: Psychiatry

## 2020-10-08 ENCOUNTER — Other Ambulatory Visit: Payer: Self-pay

## 2020-10-08 ENCOUNTER — Telehealth (INDEPENDENT_AMBULATORY_CARE_PROVIDER_SITE_OTHER): Payer: BC Managed Care – PPO | Admitting: Psychiatry

## 2020-10-08 DIAGNOSIS — F331 Major depressive disorder, recurrent, moderate: Secondary | ICD-10-CM

## 2020-10-08 DIAGNOSIS — F419 Anxiety disorder, unspecified: Secondary | ICD-10-CM

## 2020-10-08 MED ORDER — BUPROPION HCL ER (XL) 150 MG PO TB24: 150.0000 mg | ORAL_TABLET | Freq: Every day | ORAL | 2 refills | Status: DC

## 2020-10-08 MED ORDER — ESCITALOPRAM OXALATE 20 MG PO TABS: 20.0000 mg | ORAL_TABLET | Freq: Every day | ORAL | 2 refills | Status: DC

## 2020-10-08 NOTE — Progress Notes (Signed)
Virtual Visit via Telephone Note  I connected with Julie [name] on 10/08/20 at  8:40 AM EDT by telephone and verified that I am speaking with the correct person using two identifiers.  Location: Patient: home Provider: home office   I discussed the limitations, risks, security and privacy concerns of performing an evaluation and management service by telephone and the availability of in person appointments. I also discussed with the patient that there may be a patient responsible charge related to this service. The patient expressed understanding and agreed to proceed.   History of Present Illness: Patient is evaluated by phone session.  On the last visit we switch the Wellbutrin from SR to XL.  She noticed improvement in her motivation, energy level.  She feels that since the medicine adjusted she is able to clean her room and feel more motivated to do things.  She is more relaxed and calm.  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  She wants to have a career change.  She had called few places and getting information about the prerequisite.  Overall she feels her anxiety depression is stable.  She is sleeping good.  She lives with her mother.  She denies any crying spells, feeling of hopelessness or any suicidal thoughts.  She has not called to schedule appointment with therapist but now asking [place] if we can refer her to see counselor.  She is taking Lexapro and Wellbutrin and reported no tremors shakes or any EPS.  Occasionally she smokes marijuana for recreation.  She denies any mania, psychosis or any hallucination.  She sleeps good and has not taken hydroxyzine in a while.   Past Psychiatric History: H/OinpatientinOctober 2019 due to severe depression and plan to kill herself.Did PHP and IOP.No h/omania, psychosis or any hallucination.  Psychiatric Specialty Exam: Physical Exam  Review of Systems  Weight 263 lb (119.3 kg).There is no height or weight on file to  calculate BMI.  General Appearance: NA  Eye Contact:  NA  Speech:  Clear and Coherent  Volume:  Normal  Mood:  Euthymic  Affect:  NA  Thought Process:  Goal Directed  Orientation:  Full (Time, Place, and Person)  Thought Content:  Logical  Suicidal Thoughts:  No  Homicidal Thoughts:  No  Memory:  Immediate;   Good Recent;   Good Remote;   Good  Judgement:  Intact  Insight:  Present  Psychomotor Activity:  NA  Concentration:  Concentration: Good and Attention Span: Good  Recall:  Good  Fund of Knowledge:  Good  Language:  Good  Akathisia:  No  Handed:  Right  AIMS (if indicated):     Assets:  Communication Skills Desire for Improvement Housing Resilience Social Support Talents/Skills Transportation  ADL's:  Intact  Cognition:  WNL  Sleep:   ok      Assessment and Plan: Major depressive disorder, recurrent.  Anxiety.  Patient doing better on Wellbutrin XL 150 mg.  It was changed from Wellbutrin SR 100 mg in the morning on the last visit.  She noticed improvement.  Discussed medication side effects and benefits.  Continue Lexapro 20 mg daily and Wellbutrin XL 150 mg in the morning.  She has not taken hydroxyzine in a while and does not need a new prescription.  We will provide referral to a therapist.  Recommended to call us back if she is any question or any concern.  Follow-up in 3 months.  Follow Up Instructions:  I discussed the assessment and treatment plan with the patient. The patient was provided an opportunity to ask questions and all were answered. The patient agreed with the plan and demonstrated an understanding of the instructions.   The patient was advised to call back or seek an in-person evaluation if the symptoms worsen or if the condition fails to improve as anticipated.  I provided 17 minutes of non-face-to-face time during this encounter.   Kathlee Nations, MD

## 2020-10-24 ENCOUNTER — Other Ambulatory Visit (HOSPITAL_COMMUNITY): Payer: Self-pay | Admitting: Psychiatry

## 2020-10-24 DIAGNOSIS — F419 Anxiety disorder, unspecified: Secondary | ICD-10-CM

## 2020-10-24 DIAGNOSIS — F331 Major depressive disorder, recurrent, moderate: Secondary | ICD-10-CM

## 2021-01-07 ENCOUNTER — Other Ambulatory Visit: Payer: Self-pay

## 2021-01-07 ENCOUNTER — Encounter (HOSPITAL_COMMUNITY): Payer: Self-pay | Admitting: Psychiatry

## 2021-01-07 ENCOUNTER — Telehealth (INDEPENDENT_AMBULATORY_CARE_PROVIDER_SITE_OTHER): Payer: BC Managed Care – PPO | Admitting: Psychiatry

## 2021-01-07 VITALS — Wt 264.0 lb

## 2021-01-07 DIAGNOSIS — F419 Anxiety disorder, unspecified: Secondary | ICD-10-CM

## 2021-01-07 DIAGNOSIS — F331 Major depressive disorder, recurrent, moderate: Secondary | ICD-10-CM | POA: Diagnosis not present

## 2021-01-07 DIAGNOSIS — F1210 Cannabis abuse, uncomplicated: Secondary | ICD-10-CM | POA: Diagnosis not present

## 2021-01-07 MED ORDER — BUPROPION HCL ER (XL) 150 MG PO TB24: 150.0000 mg | ORAL_TABLET | Freq: Every day | ORAL | 2 refills | Status: DC

## 2021-01-07 MED ORDER — ESCITALOPRAM OXALATE 20 MG PO TABS: 20.0000 mg | ORAL_TABLET | Freq: Every day | ORAL | 2 refills | Status: DC

## 2021-01-07 NOTE — Progress Notes (Signed)
Virtual Visit via Telephone Note  I connected with Julie [name] on 01/07/21 at  8:40 AM EDT by telephone and verified that I am speaking with the correct person using two identifiers.  Location: Patient: Home Provider: Home Office   I discussed the limitations, risks, security and privacy concerns of performing an evaluation and management service by telephone and the availability of in person appointments. I also discussed with the patient that there may be a patient responsible charge related to this service. The patient expressed understanding and agreed to proceed.   History of Present Illness: Patient is evaluated by phone session.  She is taking Wellbutrin XL 150 mg and Lexapro 20 mg.  She noticed improvement in her mood, motivation and energy.  Patient told recently they have moved to a bigger place in [facility] which is closer to her mother's job.  Patient also looking for a job that she can work from home and had better period.  She had a offer and she is going to start very soon.  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  She feels her anxiety and depression is a stable.  She has not taken hydroxyzine in more than 6 months.  She admitted smoke marijuana 2-3 times a week for recreation but denies any intoxication.  She denies any panic attack.  She denies any crying spells, feeling of hopelessness or worthlessness.  She has no tremors, shakes or any EPS.  Her appetite is okay and her weight is unchanged from the past.  She sleeps good.  She wants to keep the current medication.  Patient lives with her mother.  Past Psychiatric History:  H/O inpatient in October 2019 due to severe depression and plan to kill herself.  Did PHP and IOP. No h/o mania, psychosis or any hallucination.    Psychiatric Specialty Exam: Physical Exam  Review of Systems  Weight 264 lb (119.7 kg).There is no height or weight on file to calculate BMI.  General  Appearance: NA  Eye Contact:  NA  Speech:  Clear and Coherent and Normal Rate  Volume:  Normal  Mood:  Euthymic  Affect:  NA  Thought Process:  Goal Directed  Orientation:  Full (Time, Place, and Person)  Thought Content:  WDL  Suicidal Thoughts:  No  Homicidal Thoughts:  No  Memory:  Immediate;   Good Recent;   Good Remote;   Good  Judgement:  Intact  Insight:  Present  Psychomotor Activity:  NA  Concentration:  Concentration: Good and Attention Span: Good  Recall:  Good  Fund of Knowledge:  Good  Language:  Good  Akathisia:  No  Handed:  Right  AIMS (if indicated):     Assets:  Communication Skills Desire for Improvement Housing Resilience Talents/Skills Transportation  ADL's:  Intact  Cognition:  WNL  Sleep:   ok      Assessment and Plan: Major depressive disorder, recurrent.  Anxiety.  Mild cannabis use  Patient is stable on current medication.  Discuss use of cannabis and current psychotropic medication interaction.  Patient is aware and she is trying to cut down her cannabis use.  She does not feel at this time need to see a therapist because she feels stable.  We will continue Lexapro 20 mg daily and Wellbutrin XL 150 mg in the morning.  Recommended to call us back if she has any question or any concern.  Follow-up in 3 months.  Follow Up Instructions:    I discussed the assessment and treatment plan with the patient. The patient was provided an opportunity to ask questions and all were answered. The patient agreed with the plan and demonstrated an understanding of the instructions.   The patient was advised to call back or seek an in-person evaluation if the symptoms worsen or if the condition fails to improve as anticipated.  I provided 17 minutes of non-face-to-face time during this encounter.   Kathlee Nations, MD

## 2021-04-21 DIAGNOSIS — F4310 Post-traumatic stress disorder, unspecified: Secondary | ICD-10-CM | POA: Diagnosis not present

## 2021-04-28 DIAGNOSIS — F4310 Post-traumatic stress disorder, unspecified: Secondary | ICD-10-CM | POA: Diagnosis not present

## 2021-05-07 DIAGNOSIS — F4310 Post-traumatic stress disorder, unspecified: Secondary | ICD-10-CM | POA: Diagnosis not present

## 2021-05-19 DIAGNOSIS — F4310 Post-traumatic stress disorder, unspecified: Secondary | ICD-10-CM | POA: Diagnosis not present

## 2021-05-27 ENCOUNTER — Telehealth (HOSPITAL_COMMUNITY): Payer: Self-pay | Admitting: *Deleted

## 2021-05-27 ENCOUNTER — Other Ambulatory Visit (HOSPITAL_COMMUNITY): Payer: Self-pay | Admitting: *Deleted

## 2021-05-27 DIAGNOSIS — F331 Major depressive disorder, recurrent, moderate: Secondary | ICD-10-CM

## 2021-05-27 DIAGNOSIS — F419 Anxiety disorder, unspecified: Secondary | ICD-10-CM

## 2021-05-27 DIAGNOSIS — F411 Generalized anxiety disorder: Secondary | ICD-10-CM

## 2021-05-27 MED ORDER — BUPROPION HCL ER (XL) 150 MG PO TB24
150.0000 mg | ORAL_TABLET | Freq: Every day | ORAL | 0 refills | Status: DC
Start: 2021-05-27 — End: 2021-07-09

## 2021-05-27 MED ORDER — ESCITALOPRAM OXALATE 20 MG PO TABS
20.0000 mg | ORAL_TABLET | Freq: Every day | ORAL | 0 refills | Status: DC
Start: 2021-05-27 — End: 2021-07-09

## 2021-05-27 MED ORDER — HYDROXYZINE HCL 25 MG PO TABS
25.0000 mg | ORAL_TABLET | Freq: Two times a day (BID) | ORAL | 0 refills | Status: DC | PRN
Start: 2021-05-27 — End: 2023-03-08

## 2021-05-27 NOTE — Telephone Encounter (Signed)
Pt called requesting refills on all meds (Wellbutrin, Lexapro, Vistaril). Pt last seen 01/07/21 so was advised to make an appointment, which is now on the books for 07/09/20. Ok to bridge?

## 2021-05-27 NOTE — Telephone Encounter (Signed)
Yes, ok to bridge. 

## 2021-05-28 DIAGNOSIS — F4310 Post-traumatic stress disorder, unspecified: Secondary | ICD-10-CM | POA: Diagnosis not present

## 2021-06-02 DIAGNOSIS — F4310 Post-traumatic stress disorder, unspecified: Secondary | ICD-10-CM | POA: Diagnosis not present

## 2021-06-09 DIAGNOSIS — F4310 Post-traumatic stress disorder, unspecified: Secondary | ICD-10-CM | POA: Diagnosis not present

## 2021-06-25 DIAGNOSIS — F4310 Post-traumatic stress disorder, unspecified: Secondary | ICD-10-CM | POA: Diagnosis not present

## 2021-06-30 DIAGNOSIS — F4310 Post-traumatic stress disorder, unspecified: Secondary | ICD-10-CM | POA: Diagnosis not present

## 2021-07-07 DIAGNOSIS — F4310 Post-traumatic stress disorder, unspecified: Secondary | ICD-10-CM | POA: Diagnosis not present

## 2021-07-09 ENCOUNTER — Telehealth (HOSPITAL_BASED_OUTPATIENT_CLINIC_OR_DEPARTMENT_OTHER): Payer: BC Managed Care – PPO | Admitting: Psychiatry

## 2021-07-09 ENCOUNTER — Other Ambulatory Visit (HOSPITAL_COMMUNITY): Payer: Self-pay | Admitting: *Deleted

## 2021-07-09 ENCOUNTER — Other Ambulatory Visit: Payer: Self-pay

## 2021-07-09 ENCOUNTER — Encounter (HOSPITAL_COMMUNITY): Payer: Self-pay | Admitting: Psychiatry

## 2021-07-09 DIAGNOSIS — F331 Major depressive disorder, recurrent, moderate: Secondary | ICD-10-CM | POA: Diagnosis not present

## 2021-07-09 DIAGNOSIS — F419 Anxiety disorder, unspecified: Secondary | ICD-10-CM | POA: Diagnosis not present

## 2021-07-09 DIAGNOSIS — Z5181 Encounter for therapeutic drug level monitoring: Secondary | ICD-10-CM

## 2021-07-09 MED ORDER — BUPROPION HCL ER (XL) 150 MG PO TB24: 150.0000 mg | ORAL_TABLET | Freq: Every day | ORAL | 0 refills | Status: DC

## 2021-07-09 MED ORDER — ESCITALOPRAM OXALATE 20 MG PO TABS: 20.0000 mg | ORAL_TABLET | Freq: Every day | ORAL | 0 refills | Status: DC

## 2021-07-09 NOTE — Progress Notes (Signed)
Virtual Visit via Telephone Note  I connected with Julie [name] on 07/09/21 at  2:00 PM EST by telephone and verified that I am speaking with the correct person using two identifiers.  Location: Patient: Home Provider: Office   I discussed the limitations, risks, security and privacy concerns of performing an evaluation and management service by telephone and the availability of in person appointments. I also discussed with the patient that there may be a patient responsible charge related to this service. The patient expressed understanding and agreed to proceed.   History of Present Illness: Patient is evaluated by phone session.  She was last seen in July and she forgot to schedule appointment but taking the medication until she ran out 2 months ago.  She experiencing increased anxiety depression when she was out of the medication.  She is back on medicine last week and she is feeling better.  Patient reported she was able to get a job to work from home at [employer] in [place].  Currently she is doing training but hoping to start full-time work soon.  She denies any crying spells or any feeling of hopelessness.  She quit smoking marijuana but sometimes she do eat edible.  She lives with her mother.  She rarely takes hydroxyzine when she cannot sleep.  She denies any major panic attack, feeling of hopelessness or crying spells.  Her energy level is fair.  She admitted not exercising and walking but hoping to start soon.  She has not seen her PCP Dr. Precious Haws in a while but she recall no issues in her medical history.  She has no tremors, shakes.  She like to keep the Wellbutrin and Lexapro.   Past Psychiatric History:  H/O inpatient in October 2019 due to severe depression and plan to kill herself.  Did PHP and IOP. No h/o mania, psychosis or any hallucination.    Psychiatric Specialty Exam: Physical Exam  Review of Systems  Weight 268 lb (121.6 kg).There is no height or  weight on file to calculate BMI.  General Appearance: NA  Eye Contact:  NA  Speech:  Slow  Volume:  Normal  Mood:  Anxious  Affect:  NA  Thought Process:  Goal Directed  Orientation:  Full (Time, Place, and Person)  Thought Content:  WDL  Suicidal Thoughts:  No  Homicidal Thoughts:  No  Memory:  Immediate;   Good Recent;   Good Remote;   Good  Judgement:  Fair  Insight:  Shallow  Psychomotor Activity:  NA  Concentration:  Concentration: Fair and Attention Span: Good  Recall:  Good  Fund of Knowledge:  Good  Language:  Good  Akathisia:  No  Handed:  Right  AIMS (if indicated):     Assets:  Communication Skills Desire for Improvement Housing Resilience Transportation  ADL's:  Intact  Cognition:  WNL  Sleep:   ok      Assessment and Plan: Major depressive disorder, recurrent.  Anxiety.  Patient is stable on her current medication.  Discussed the risk of relapse due to noncompliance with medication and keep the appointment.  Patient agreed to keep the appointment in the future.  Continue Lexapro 20 mg daily and Wellbutrin XL 150 mg in the morning.  She rarely takes hydroxyzine and does not need any refill at this time.  Recommended to call us back if there is any question or any concern.  I encouraged annual physical blood work with the PCP.  Encourage regular  walking and watching her calorie intake.  Follow-up in 3 months.  Follow Up Instructions:    I discussed the assessment and treatment plan with the patient. The patient was provided an opportunity to ask questions and all were answered. The patient agreed with the plan and demonstrated an understanding of the instructions.   The patient was advised to call back or seek an in-person evaluation if the symptoms worsen or if the condition fails to improve as anticipated.  I provided 1`8 minutes of non-face-to-face time during this encounter.   Kathlee Nations, MD

## 2021-07-14 DIAGNOSIS — F4310 Post-traumatic stress disorder, unspecified: Secondary | ICD-10-CM | POA: Diagnosis not present

## 2021-07-21 DIAGNOSIS — F4310 Post-traumatic stress disorder, unspecified: Secondary | ICD-10-CM | POA: Diagnosis not present

## 2021-07-28 DIAGNOSIS — F4310 Post-traumatic stress disorder, unspecified: Secondary | ICD-10-CM | POA: Diagnosis not present

## 2021-08-04 DIAGNOSIS — F4310 Post-traumatic stress disorder, unspecified: Secondary | ICD-10-CM | POA: Diagnosis not present

## 2021-08-18 DIAGNOSIS — F4310 Post-traumatic stress disorder, unspecified: Secondary | ICD-10-CM | POA: Diagnosis not present

## 2021-09-15 DIAGNOSIS — F4310 Post-traumatic stress disorder, unspecified: Secondary | ICD-10-CM | POA: Diagnosis not present

## 2021-10-07 ENCOUNTER — Encounter (HOSPITAL_COMMUNITY): Payer: Self-pay | Admitting: Psychiatry

## 2021-10-07 ENCOUNTER — Telehealth (HOSPITAL_BASED_OUTPATIENT_CLINIC_OR_DEPARTMENT_OTHER): Payer: BC Managed Care – PPO | Admitting: Psychiatry

## 2021-10-07 VITALS — Wt 268.0 lb

## 2021-10-07 DIAGNOSIS — F331 Major depressive disorder, recurrent, moderate: Secondary | ICD-10-CM | POA: Diagnosis not present

## 2021-10-07 DIAGNOSIS — F419 Anxiety disorder, unspecified: Secondary | ICD-10-CM

## 2021-10-07 DIAGNOSIS — F1210 Cannabis abuse, uncomplicated: Secondary | ICD-10-CM | POA: Diagnosis not present

## 2021-10-07 MED ORDER — ESCITALOPRAM OXALATE 20 MG PO TABS: 20.0000 mg | ORAL_TABLET | Freq: Every day | ORAL | 0 refills | Status: DC

## 2021-10-07 MED ORDER — BUPROPION HCL ER (XL) 150 MG PO TB24: 150.0000 mg | ORAL_TABLET | Freq: Every day | ORAL | 0 refills | Status: DC

## 2021-10-07 NOTE — Progress Notes (Signed)
Virtual Visit via Telephone Note ? ?I connected with Julie [name] on 10/07/21 at  1:40 PM EDT by telephone and verified that I am speaking with the correct person using two identifiers. ? ?Location: ?Patient: Home ?Provider: Home Office ?  ?I discussed the limitations, risks, security and privacy concerns of performing an evaluation and management service by telephone and the availability of in person appointments. I also discussed with the patient that there may be a patient responsible charge related to this service. The patient expressed understanding and agreed to proceed. ? ? ?History of Present Illness: ?Patient is evaluated by phone session.  She is working part-time at [employer].  She requires at least 12 hours a week but there are times when she cannot even do the 12 hours.  She feels bored and now start looking for another job that she can do virtually.  She sleeps okay and rarely takes hydroxyzine when cannot sleep.  She is to smoke marijuana once a week but has cut down from the past.  She lives with her mother.  She denies any crying spells or any feeling of hopelessness or worthlessness.  Her appetite is good.  Her weight is stable.  She is walking every day and trying to keep her weight under control.  She has not seen her PCP in a while.  She has no tremor or shakes or any EPS.  She like to keep the Wellbutrin and Lexapro. ?   ?Past Psychiatric History:  ?H/O inpatient in October 2019 due to severe depression and plan to kill herself.  Did PHP and IOP. No h/o mania, psychosis or any hallucination.   ? ?Psychiatric Specialty Exam: ?Physical Exam  ?Review of Systems  ?Weight 268 lb (121.6 kg).There is no height or weight on file to calculate BMI.  ?General Appearance: NA  ?Eye Contact:  NA  ?Speech:  Normal Rate  ?Volume:  Normal  ?Mood:  Euthymic  ?Affect:  NA  ?Thought Process:  Goal Directed  ?Orientation:  Full (Time, Place, and Person)  ?Thought Content:  WDL  ?Suicidal Thoughts:  No   ?Homicidal Thoughts:  No  ?Memory:  Immediate;   Good ?Recent;   Good ?Remote;   Good  ?Judgement:  Fair  ?Insight:  Shallow  ?Psychomotor Activity:  NA  ?Concentration:  Concentration: Fair and Attention Span: Fair  ?Recall:  Good  ?Fund of Knowledge:  Good  ?Language:  Good  ?Akathisia:  No  ?Handed:  Right  ?AIMS (if indicated):     ?Assets:  Communication Skills ?Desire for Improvement ?Housing ?Social Support ?Transportation  ?ADL's:  Intact  ?Cognition:  WNL  ?Sleep:   ok  ? ? ? ? ?Assessment and Plan: ?Major depressive disorder, recurrent.  Anxiety. ? ?Patient is stable and like to keep the current medication.  Continue Lexapro 20 mg daily and Wellbutrin XL 150 mg in the morning.  She takes rarely hydroxyzine and she still has refill remaining.  We talk about stopping the marijuana and patient is working on it.  I also remind her to get physical and blood work.  Discussed medication side effects and benefits.  Recommended to call us back if she is any question or any concern.  Follow-up in 3 months. ? ?Follow Up Instructions: ? ?  ?I discussed the assessment and treatment plan with the patient. The patient was provided an opportunity to ask questions and all were answered. The patient agreed with the plan and demonstrated an understanding of the  instructions. ?  ?The patient was advised to call back or seek an in-person evaluation if the symptoms worsen or if the condition fails to improve as anticipated. ? ?Collaboration of Care: Primary Care Provider AEB recommended to schedule appointment for physical and blood work. ? ?Patient/Guardian was advised Release of Information must be obtained prior to any record release in order to collaborate their care with an outside provider. Patient/Guardian was advised if they have not already done so to contact the registration department to sign all necessary forms in order for [place] to release information regarding their care.  ? ?Consent: Patient/Guardian gives verbal  consent for treatment and assignment of benefits for services provided during this visit. Patient/Guardian expressed understanding and agreed to proceed.   ? ?I provided 18 minutes of non-face-to-face time during this encounter. ? ? ?Cleotis Nipper, MD  ?

## 2021-10-13 DIAGNOSIS — F4310 Post-traumatic stress disorder, unspecified: Secondary | ICD-10-CM | POA: Diagnosis not present

## 2021-10-14 ENCOUNTER — Other Ambulatory Visit (HOSPITAL_COMMUNITY): Payer: Self-pay | Admitting: Psychiatry

## 2021-10-14 DIAGNOSIS — F331 Major depressive disorder, recurrent, moderate: Secondary | ICD-10-CM

## 2021-10-14 DIAGNOSIS — F1210 Cannabis abuse, uncomplicated: Secondary | ICD-10-CM

## 2021-10-14 DIAGNOSIS — F419 Anxiety disorder, unspecified: Secondary | ICD-10-CM

## 2021-10-27 DIAGNOSIS — H52223 Regular astigmatism, bilateral: Secondary | ICD-10-CM | POA: Diagnosis not present

## 2021-10-27 DIAGNOSIS — F4310 Post-traumatic stress disorder, unspecified: Secondary | ICD-10-CM | POA: Diagnosis not present

## 2021-10-27 DIAGNOSIS — H4603 Optic papillitis, bilateral: Secondary | ICD-10-CM | POA: Diagnosis not present

## 2021-10-29 ENCOUNTER — Emergency Department (HOSPITAL_COMMUNITY): Payer: BC Managed Care – PPO

## 2021-10-29 ENCOUNTER — Emergency Department (HOSPITAL_COMMUNITY)
Admission: EM | Admit: 2021-10-29 | Discharge: 2021-10-29 | Discharge status: Against Medical Advice | Payer: BC Managed Care – PPO | Attending: Student | Admitting: Student

## 2021-10-29 ENCOUNTER — Other Ambulatory Visit: Payer: Self-pay

## 2021-10-29 ENCOUNTER — Encounter (HOSPITAL_COMMUNITY): Payer: Self-pay

## 2021-10-29 DIAGNOSIS — H47093 Other disorders of optic nerve, not elsewhere classified, bilateral: Secondary | ICD-10-CM | POA: Diagnosis not present

## 2021-10-29 DIAGNOSIS — R519 Headache, unspecified: Secondary | ICD-10-CM | POA: Diagnosis not present

## 2021-10-29 DIAGNOSIS — H4603 Optic papillitis, bilateral: Secondary | ICD-10-CM | POA: Diagnosis not present

## 2021-10-29 DIAGNOSIS — H4710 Unspecified papilledema: Secondary | ICD-10-CM | POA: Diagnosis not present

## 2021-10-29 DIAGNOSIS — Z5321 Procedure and treatment not carried out due to patient leaving prior to being seen by health care provider: Secondary | ICD-10-CM | POA: Insufficient documentation

## 2021-10-29 DIAGNOSIS — Q283 Other malformations of cerebral vessels: Secondary | ICD-10-CM | POA: Diagnosis not present

## 2021-10-29 LAB — BASIC METABOLIC PANEL
Anion gap: 9 (ref 5–15)
BUN: 9 mg/dL (ref 6–20)
CO2: 22 mmol/L (ref 22–32)
Calcium: 9.1 mg/dL (ref 8.9–10.3)
Chloride: 108 mmol/L (ref 98–111)
Creatinine, Ser: 0.84 mg/dL (ref 0.44–1.00)
GFR, Estimated: >60 mL/min (ref >60)
Glucose, Bld: 95 mg/dL (ref 70–99)
Potassium: 3.6 mmol/L (ref 3.5–5.1)
Sodium: 139 mmol/L (ref 135–145)

## 2021-10-29 LAB — CBC WITH DIFFERENTIAL/PLATELET
Abs Immature Granulocytes: 0.01 10*3/uL (ref 0.00–0.07)
Basophils Absolute: 0.1 10*3/uL (ref 0.0–0.1)
Basophils Relative: 1 %
Eosinophils Absolute: 0.8 10*3/uL — ABNORMAL HIGH (ref 0.0–0.5)
Eosinophils Relative: 13 %
HCT: 36.5 % (ref 36.0–46.0)
Hemoglobin: 11.5 g/dL — ABNORMAL LOW (ref 12.0–15.0)
Immature Granulocytes: 0 %
Lymphocytes Relative: 54 %
Lymphs Abs: 3.5 10*3/uL (ref 0.7–4.0)
MCH: 24.5 pg — ABNORMAL LOW (ref 26.0–34.0)
MCHC: 31.5 g/dL (ref 30.0–36.0)
MCV: 77.7 fL — ABNORMAL LOW (ref 80.0–100.0)
Monocytes Absolute: 0.6 10*3/uL (ref 0.1–1.0)
Monocytes Relative: 8 %
Neutro Abs: 1.6 10*3/uL — ABNORMAL LOW (ref 1.7–7.7)
Neutrophils Relative %: 24 %
Platelets: 396 10*3/uL (ref 150–400)
RBC: 4.7 MIL/uL (ref 3.87–5.11)
RDW: 14.1 % (ref 11.5–15.5)
WBC: 6.5 10*3/uL (ref 4.0–10.5)
nRBC: 0 % (ref 0.0–0.2)

## 2021-10-29 LAB — I-STAT BETA HCG BLOOD, ED (MC, WL, AP ONLY): I-stat hCG, quantitative: <5 m[IU]/mL (ref <5)

## 2021-10-29 MED ORDER — GADOBUTROL 1 MMOL/ML IV SOLN
10.0000 mL | Freq: Once | INTRAVENOUS | Status: AC | PRN
  Administered 2021-10-29: 10 mL via INTRAVENOUS

## 2021-10-29 NOTE — ED Provider Triage Note (Signed)
Emergency Medicine Provider Triage Evaluation Note ? ?Julie [name] , a 21 y.o. female  was evaluated in triage.  Pt complains of visual floaters.  Patient was sent here by ophthalmologist who evaluated the patient and found bilateral papilledema.  Patient is also complained of intermittent headaches.  Ophthalmologist requested that the patient have MRI brain and orbits with and without contrast for further evaluation.  She denies any issues with balance, nausea or vomiting, fevers, head trauma.  She denies any numbness or tingling to her face arms or legs.  Denies any focal weakness. ? ?Review of Systems  ?Positive: See above ?Negative:  ?Physical Exam  ?BP (!) 136/102 (BP Location: Left Arm)   Pulse 96   Temp 97.9 ?F (36.6 ?C) (Oral)   Resp 16   Ht 5\' 5"  (1.651 m)   Wt 120.2 kg   LMP 10/15/2021 (Approximate)   SpO2 95%   BMI 44.10 kg/m?  ?Gen:   Awake, no distress   ?Resp:  Normal effort  ?MSK:   Moves extremities without difficulty  ?Other:  Pupils are dilated from ophthalmology exam.  No nystagmus.  Vision is grossly intact.  CN II through XII intact.  No focal weakness. ? ?Medical Decision Making  ?Medically screening exam initiated at 5:34 PM.  Appropriate orders placed.  Julie [name] was informed that the remainder of the evaluation will be completed by another provider, this initial triage assessment does not replace that evaluation, and the importance of remaining in the ED until their evaluation is complete. ? ?MRIs ordered  ?  ?Mickie Hillier, PA-C ?10/29/21 1737 ? ?

## 2021-10-29 NOTE — ED Notes (Signed)
Pt states that she is leaving the facility.  ?

## 2021-10-29 NOTE — ED Triage Notes (Signed)
Patient reports that she has bilateral optic nerve swelling. Patient was sent to the ED for an MRI of the head and orbits and states if these are negative then she needs a lumbar puncture. ?

## 2021-11-06 ENCOUNTER — Encounter: Payer: Self-pay | Admitting: Neurology

## 2021-11-10 DIAGNOSIS — F4310 Post-traumatic stress disorder, unspecified: Secondary | ICD-10-CM | POA: Diagnosis not present

## 2021-11-24 DIAGNOSIS — F4310 Post-traumatic stress disorder, unspecified: Secondary | ICD-10-CM | POA: Diagnosis not present

## 2021-11-24 NOTE — Progress Notes (Deleted)
 NEUROLOGY CONSULTATION NOTE  Julie [name] MRN: [id number] DOB: 07/13/2000  Referring provider: Samantha Dunnington, OD Primary care provider: No PCP  Reason for consult:  papilledema  Assessment/Plan:   Bilateral papilledema likely representing idiopathic intracranial hypertension  Schedule for lumbar puncture to assess opening pressure Will also check MRV of head to evaluate for thrombosis or venous sinus stenosis.   Pending results, plan to start acetazolamide 500mg twice daily Follow up 4 months.   Subjective:  Julie [name] is a 21 year old female who presents for papilledema.  History supplemented by referring provider's note.  She saw her optometrist on 10/29/2021 for routine exam.  Her last eye exam was at least 2 years prior.  Exam revealed bilateral disc edema with irregular margins, worse on right, consistent with papilledema.  She was sent to the ED for imaging where MRI of brain and orbits with and without contrast personally reviewed were normal.  She left before an LP could be performed.    She reports history of dull headaches around the eyes, right worse than left.    They are infrequent but they were daily back in March-April.  She also endorses visual obscurations particularly when she bends over or then gets back up.  Denies pulsatile tinnitus.  She reports that she gained about 10 lbs over the past year.  She is not on birth control medication.       PAST MEDICAL HISTORY: Past Medical History:  Diagnosis Date   Anxiety    CMV (cytomegalovirus infection) (HCC) 04/13/2018   Depression    Dysmenorrhea    Painful menstrual periods     PAST SURGICAL HISTORY: Past Surgical History:  Procedure Laterality Date   TONSILLECTOMY      MEDICATIONS: Current Outpatient Medications on File Prior to Visit  Medication Sig Dispense Refill   buPROPion (WELLBUTRIN XL) 150 MG 24 hr tablet Take 1 tablet (150 mg total) by mouth daily. 90 tablet 0   cetirizine (ZYRTEC)  10 MG tablet Take 10 mg by mouth every morning. (Patient not taking: Reported on 07/09/2021)  4   escitalopram (LEXAPRO) 20 MG tablet Take 1 tablet (20 mg total) by mouth daily. 90 tablet 0   fluticasone (FLONASE) 50 MCG/ACT nasal spray Place 2 sprays into both nostrils daily. (Patient not taking: Reported on 07/09/2021)     hydrOXYzine (ATARAX) 25 MG tablet Take 1 tablet (25 mg total) by mouth 2 (two) times daily as needed for anxiety. 30 tablet 0   TRI-SPRINTEC 0.18/0.215/0.25 MG-35 MCG tablet TK 1 T PO QD TO MANAGE MENSTRUAL CRAMPS (Patient not taking: Reported on 07/09/2021)     No current facility-administered medications on file prior to visit.    ALLERGIES: No Known Allergies  FAMILY HISTORY: Family History  Problem Relation Age of Onset   Hypertension Mother    Chronic Renal Failure Father    Depression Father    Alcohol abuse Maternal Uncle    Depression Paternal Uncle    Dementia Maternal Grandmother    Depression Paternal Grandfather    Depression Paternal Grandmother     Objective:  Blood pressure 135/72, pulse 99, height 5' 5" (1.651 m), weight 274 lb (124.3 kg), SpO2 95 %. General: No acute distress.  Patient appears well-groomed.   Head:  Normocephalic/atraumatic Eyes:  fundi examined but not visualized Neck: supple, no paraspinal tenderness, full range of motion Heart: regular rate and rhythm Neurological Exam: Mental status: alert and oriented to person, place, and time, recent and remote   memory intact, fund of knowledge intact, attention and concentration intact, speech fluent and not dysarthric, language intact. Cranial nerves: CN I: not tested CN II: pupils equal, round and reactive to light, visual fields intact CN III, IV, VI:  full range of motion, no nystagmus, no ptosis CN V: facial sensation intact. CN VII: upper and lower face symmetric CN VIII: hearing intact CN IX, X: gag intact, uvula midline CN XI: sternocleidomastoid and trapezius muscles  intact CN XII: tongue midline Bulk & Tone: normal, no fasciculations. Motor:  muscle strength 5/5 throughout Sensation:  Pinprick, temperature and vibratory sensation intact. Deep Tendon Reflexes:  2+ throughout,  toes downgoing.   Finger to nose testing:  Without dysmetria.   Heel to shin:  Without dysmetria.   Gait:  Normal station and stride.  Romberg negative.    Thank you for allowing me to take part in the care of this patient.  Lanee Chain, DO  CC: Samantha Dunnington, OD    

## 2021-11-25 ENCOUNTER — Ambulatory Visit: Payer: BC Managed Care – PPO | Admitting: Neurology

## 2021-12-03 NOTE — Progress Notes (Unsigned)
NEUROLOGY CONSULTATION NOTE  DEONI KOPINSKI MRN: [id number] DOB: 2001-06-13  Referring provider: Russella Dar, OD Primary care provider: No PCP  Reason for consult:  papilledema  Assessment/Plan:   Bilateral papilledema likely representing idiopathic intracranial hypertension  Schedule for lumbar puncture to assess opening pressure Will also check MRV of head to evaluate for thrombosis or venous sinus stenosis.   Pending results, plan to start acetazolamide 500mg  twice daily Follow up 4 months.   Subjective:  Julie [name] is a 21 year old female who presents for papilledema.  History supplemented by referring provider's note.  She saw her optometrist on 10/29/2021 for routine exam.  Her last eye exam was at least 2 years prior.  Exam revealed bilateral disc edema with irregular margins, worse on right, consistent with papilledema.  She was sent to the ED for imaging where MRI of brain and orbits with and without contrast personally reviewed were normal.  She left before an LP could be performed.    She reports history of dull headaches around the eyes, right worse than left.    They are infrequent but they were daily back in March-April.  She also endorses visual obscurations particularly when she bends over or then gets back up.  Denies pulsatile tinnitus.  She reports that she gained about 10 lbs over the past year.  She is not on birth control medication.       PAST MEDICAL HISTORY: Past Medical History:  Diagnosis Date   Anxiety    CMV (cytomegalovirus infection) (HCC) 04/13/2018   Depression    Dysmenorrhea    Painful menstrual periods     PAST SURGICAL HISTORY: Past Surgical History:  Procedure Laterality Date   TONSILLECTOMY      MEDICATIONS: Current Outpatient Medications on File Prior to Visit  Medication Sig Dispense Refill   buPROPion (WELLBUTRIN XL) 150 MG 24 hr tablet Take 1 tablet (150 mg total) by mouth daily. 90 tablet 0   cetirizine (ZYRTEC)  10 MG tablet Take 10 mg by mouth every morning. (Patient not taking: Reported on 07/09/2021)  4   escitalopram (LEXAPRO) 20 MG tablet Take 1 tablet (20 mg total) by mouth daily. 90 tablet 0   fluticasone (FLONASE) 50 MCG/ACT nasal spray Place 2 sprays into both nostrils daily. (Patient not taking: Reported on 07/09/2021)     hydrOXYzine (ATARAX) 25 MG tablet Take 1 tablet (25 mg total) by mouth 2 (two) times daily as needed for anxiety. 30 tablet 0   TRI-SPRINTEC 0.18/0.215/0.25 MG-35 MCG tablet TK 1 T PO QD TO MANAGE MENSTRUAL CRAMPS (Patient not taking: Reported on 07/09/2021)     No current facility-administered medications on file prior to visit.    ALLERGIES: No Known Allergies  FAMILY HISTORY: Family History  Problem Relation Age of Onset   Hypertension Mother    Chronic Renal Failure Father    Depression Father    Alcohol abuse Maternal Uncle    Depression Paternal Uncle    Dementia Maternal Grandmother    Depression Paternal Grandfather    Depression Paternal Grandmother     Objective:  Blood pressure 135/72, pulse 99, height 5\' 5"  (1.651 m), weight 274 lb (124.3 kg), SpO2 95 %. General: No acute distress.  Patient appears well-groomed.   Head:  Normocephalic/atraumatic Eyes:  fundi examined but not visualized Neck: supple, no paraspinal tenderness, full range of motion Heart: regular rate and rhythm Neurological Exam: Mental status: alert and oriented to person, place, and time, recent and remote  memory intact, fund of knowledge intact, attention and concentration intact, speech fluent and not dysarthric, language intact. Cranial nerves: CN I: not tested CN II: pupils equal, round and reactive to light, visual fields intact CN III, IV, VI:  full range of motion, no nystagmus, no ptosis CN V: facial sensation intact. CN VII: upper and lower face symmetric CN VIII: hearing intact CN IX, X: gag intact, uvula midline CN XI: sternocleidomastoid and trapezius muscles  intact CN XII: tongue midline Bulk & Tone: normal, no fasciculations. Motor:  muscle strength 5/5 throughout Sensation:  Pinprick, temperature and vibratory sensation intact. Deep Tendon Reflexes:  2+ throughout,  toes downgoing.   Finger to nose testing:  Without dysmetria.   Heel to shin:  Without dysmetria.   Gait:  Normal station and stride.  Romberg negative.    Thank you for allowing me to take part in the care of this patient.  Shon Millet, DO  CC: Russella Dar, OD

## 2021-12-07 ENCOUNTER — Encounter: Payer: Self-pay | Admitting: Neurology

## 2021-12-07 ENCOUNTER — Ambulatory Visit: Payer: BC Managed Care – PPO | Admitting: Neurology

## 2021-12-07 VITALS — BP 135/72 | HR 99 | Ht 65.0 in | Wt 274.0 lb

## 2021-12-07 DIAGNOSIS — H4710 Unspecified papilledema: Secondary | ICD-10-CM

## 2021-12-07 NOTE — Patient Instructions (Signed)
Schedule spinal tap to measure pressure - will check cell count, protein, glucose, gram stain and culture Check MRV of head Once we get the pressure result, will start acetazolamide Follow up 4 months.

## 2021-12-08 DIAGNOSIS — F4310 Post-traumatic stress disorder, unspecified: Secondary | ICD-10-CM | POA: Diagnosis not present

## 2021-12-09 ENCOUNTER — Ambulatory Visit
Admission: RE | Admit: 2021-12-09 | Discharge: 2021-12-09 | Disposition: A | Discharge status: Home / Self Care | Payer: BC Managed Care – PPO | Source: Ambulatory Visit | Attending: Neurology | Admitting: Neurology

## 2021-12-09 ENCOUNTER — Telehealth: Payer: Self-pay | Admitting: Neurology

## 2021-12-09 DIAGNOSIS — H4710 Unspecified papilledema: Secondary | ICD-10-CM | POA: Diagnosis not present

## 2021-12-09 DIAGNOSIS — R519 Headache, unspecified: Secondary | ICD-10-CM | POA: Diagnosis not present

## 2021-12-09 MED ORDER — ACETAZOLAMIDE ER 500 MG PO CP12: 500.0000 mg | ORAL_CAPSULE | Freq: Two times a day (BID) | ORAL | 0 refills | Status: DC

## 2021-12-09 NOTE — Telephone Encounter (Signed)
Spinal fluid pressure is elevated at 46.  We will need to start acetazolamide 500mg  twice daily.  Please send prescription to her pharmacy.  I would like for her to have a repeat eye exam at [facility] in 8 weeks

## 2021-12-09 NOTE — Discharge Instructions (Signed)

## 2021-12-09 NOTE — Addendum Note (Signed)
Addended by: Leida Lauth on: 12/09/2021 01:49 PM   Modules accepted: Orders

## 2021-12-09 NOTE — Telephone Encounter (Signed)
Patient advised if Dr.jaffe note. Acetazolamide 500 mg BID sent to [facility] on file.

## 2021-12-10 ENCOUNTER — Telehealth (HOSPITAL_COMMUNITY): Payer: Self-pay | Admitting: Psychiatry

## 2021-12-10 ENCOUNTER — Encounter (HOSPITAL_COMMUNITY): Payer: Self-pay | Admitting: Psychiatry

## 2021-12-10 ENCOUNTER — Telehealth (HOSPITAL_BASED_OUTPATIENT_CLINIC_OR_DEPARTMENT_OTHER): Payer: BC Managed Care – PPO | Admitting: Psychiatry

## 2021-12-10 VITALS — Wt 274.0 lb

## 2021-12-10 DIAGNOSIS — F419 Anxiety disorder, unspecified: Secondary | ICD-10-CM

## 2021-12-10 DIAGNOSIS — F331 Major depressive disorder, recurrent, moderate: Secondary | ICD-10-CM

## 2021-12-10 MED ORDER — ARIPIPRAZOLE 5 MG PO TABS: 5.0000 mg | ORAL_TABLET | Freq: Every day | ORAL | 0 refills | Status: DC

## 2021-12-10 NOTE — Progress Notes (Signed)
Virtual Visit via Telephone Note  I connected with Julie [name] on 12/10/21 at  8:40 AM EDT by telephone and verified that I am speaking with the correct person using two identifiers.  Location: Patient: Home Provider: Home Office   I discussed the limitations, risks, security and privacy concerns of performing an evaluation and management service by telephone and the availability of in person appointments. I also discussed with the patient that there may be a patient responsible charge related to this service. The patient expressed understanding and agreed to proceed.   History of Present Illness: Patient requested an earlier appointment.  She admitted that in the past visit she is not tolerating the Trileptal.  She admitted she has been struggling with severe depression, fatigue, lack of motivation to do things.  She also reported having passive and fleeting suicidal thoughts but she had no plan or any intent.  She endorsed crying spells, racing thoughts and struggle with anxiety.  Sometimes she feels very isolated, withdrawn.  She denies any paranoia or any hallucination.  She had not told the specimen.  Thoughts to her mother because she is scared to tell her.  On further questioning she admitted there was a time when she was thinking to overdose the hydroxyzine but that thought is not there anymore.  She feels hopeless, worthless.  She admitted ruminative thoughts.  She has headaches and recently her eye doctor recommended to do an MRI and sent to the emergency room.  Her MRI is scheduled on July 2.  She has lumbar puncture and results are pending.  She admitted to quit her job because she could not handle the stress.  She was working part-time for [employer].  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  Her appetite is fair.  She denies any weight gain or weight loss.  She denies any impulsive behavior but sometimes she feels better then she  baked cookies or watch TV.  She also spent time with her rabbit and sometime walk the dog.  Her PHQ is 8.  She is taking Wellbutrin and Lexapro but sometimes she does not feel that medicine working.  Initially she felt better but now she feels it is stopped working or not working.  Past Psychiatric History:  H/O inpatient in October 2019 due to severe depression and plan to kill herself.  Did PHP and IOP. No h/o mania, psychosis or any hallucination.    Recent Results (from the past 2160 hour(s))  Basic metabolic panel     Status: None   Collection Time: 10/29/21  5:40 PM  Result Value Ref Range   Sodium 139 135 - 145 mmol/L   Potassium 3.6 3.5 - 5.1 mmol/L   Chloride 108 98 - 111 mmol/L   CO2 22 22 - 32 mmol/L   Glucose, Bld 95 70 - 99 mg/dL    Comment: Glucose reference range applies only to samples taken after fasting for at least 8 hours.   BUN 9 6 - 20 mg/dL   Creatinine, Ser 3.26 0.44 - 1.00 mg/dL   Calcium 9.1 8.9 - 71.2 mg/dL   GFR, Estimated >45 >80 mL/min    Comment: (NOTE) Calculated using the CKD-EPI Creatinine Equation (2021)    Anion gap 9 5 - 15    Comment: Performed at [facility], [address], [place], [place] [address]  CBC with Differential     Status: Abnormal  Collection Time: 10/29/21  5:40 PM  Result Value Ref Range   WBC 6.5 4.0 - 10.5 K/uL   RBC 4.70 3.87 - 5.11 MIL/uL   Hemoglobin 11.5 (L) 12.0 - 15.0 g/dL   HCT 09.3 26.7 - 12.4 %   MCV 77.7 (L) 80.0 - 100.0 fL   MCH 24.5 (L) 26.0 - 34.0 pg   MCHC 31.5 30.0 - 36.0 g/dL   RDW 58.0 99.8 - 33.8 %   Platelets 396 150 - 400 K/uL   nRBC 0.0 0.0 - 0.2 %   Neutrophils Relative % 24 %   Neutro Abs 1.6 (L) 1.7 - 7.7 K/uL   Lymphocytes Relative 54 %   Lymphs Abs 3.5 0.7 - 4.0 K/uL   Monocytes Relative 8 %   Monocytes Absolute 0.6 0.1 - 1.0 K/uL   Eosinophils Relative 13 %   Eosinophils Absolute 0.8 (H) 0.0 - 0.5 K/uL   Basophils Relative 1 %   Basophils Absolute 0.1 0.0 - 0.1 K/uL    Immature Granulocytes 0 %   Abs Immature Granulocytes 0.01 0.00 - 0.07 K/uL    Comment: Performed at [facility], [address], [place], [place] [address]  I-Stat beta hCG blood, ED     Status: None   Collection Time: 10/29/21  5:47 PM  Result Value Ref Range   I-stat hCG, quantitative <5.0 <5 mIU/mL   Comment 3            Comment:   GEST. AGE      CONC.  (mIU/mL)   <=1 WEEK        5 - 50     2 WEEKS       50 - 500     3 WEEKS       100 - 10,000     4 WEEKS     1,000 - 30,000        FEMALE AND NON-PREGNANT FEMALE:     LESS THAN 5 mIU/mL   Glucose, CSF     Status: None   Collection Time: 12/09/21  8:11 AM  Result Value Ref Range   Glucose, CSF 68 40 - 80 mg/dL  CSF cell count with differential     Status: None   Collection Time: 12/09/21  8:11 AM  Result Value Ref Range   Color, CSF COLORLESS COLORLESS   Appearance, CSF CLEAR CLEAR   RBC Count, CSF 0 0 cells/uL   TOTAL NUCLEATED CELL 0 0 - 5 cells/uL   Segmented Neutrophils-CSF CANCELED     Comment: Unable to report due to insufficient analyzable cells.  Result canceled by the ancillary.    Comment:      Comment: Tube 1  Protein, CSF     Status: None   Collection Time: 12/09/21  8:11 AM  Result Value Ref Range   Total Protein, CSF 37 15 - 45 mg/dL  CSF culture     Status: None (Preliminary result)   Collection Time: 12/09/21  8:11 AM   Specimen: Lumbar Puncture; Cerebrospinal Fluid  Result Value Ref Range   MICRO NUMBER: [id number]    SPECIMEN QUALITY: Adequate    Source CEREBROSPINAL FLUID    STATUS: PRELIMINARY    GRAM STAIN:      No organisms or white blood cells seen No epithelial cells seen   Result: No growth to date      Psychiatric Specialty Exam: Physical Exam  Review of Systems  Weight 274 lb (124.3 kg), last  menstrual period 12/07/2021.There is no height or weight on file to calculate BMI.  General Appearance: NA  Eye Contact:  NA  Speech:  Slow  Volume:  Decreased  Mood:   Depressed, Dysphoric, and Worthless  Affect:  NA  Thought Process:  Descriptions of Associations: Intact  Orientation:  Full (Time, Place, and Person)  Thought Content:  Rumination  Suicidal Thoughts:   passive and fleeting thoughts but no plan  Homicidal Thoughts:  No  Memory:  Immediate;   Good Recent;   Good  Judgement:  Fair  Insight:  Fair  Psychomotor Activity:  Decreased  Concentration:  Concentration: Fair and Attention Span: Fair  Recall:  [employer] of Knowledge:  Fair  Language:  Fair  Akathisia:  No  Handed:  Right  AIMS (if indicated):     Assets:  Communication Skills Desire for Improvement Housing Social Support Transportation  ADL's:  Intact  Cognition:  WNL  Sleep:   7-8 hrs     Assessment and Plan: Major depressive disorder, recurrent.  Anxiety.  Discussed blood work results which was done recently.  Discussed her current medication, psychosocial stressors.  I encouraged that she should be open about her symptoms so we can help her with medication.  I recommend try low-dose Abilify to help her ruminative, negative and passive fleeting thoughts.  In the meantime recommend to continue Wellbutrin and Lexapro but we will consider cutting down the Wellbutrin in the future once Abilify start working.  She has enough refills for Lexapro 20 mg, Wellbutrin for now.  I also recommend consider IOP and she agreed to consider it.  I will refer her to Jeri Modena our program coordinator for IOP.  She is seeing a therapist but lately she had cut down from once a week to every 2 weeks.  Discussed safety concerns at any time having active suicidal thoughts or homicidal thought that she need to call 911 or go to local emergency room.  We will follow up in 2 weeks.  Patient is not taking hydroxyzine.  Follow Up Instructions:    I discussed the assessment and treatment plan with the patient. The patient was provided an opportunity to ask questions and all were answered. The patient  agreed with the plan and demonstrated an understanding of the instructions.   The patient was advised to call back or seek an in-person evaluation if the symptoms worsen or if the condition fails to improve as anticipated.  Collaboration of Care: Other provider involved in patient's care AEB notes in epic to review.  Patient/Guardian was advised Release of Information must be obtained prior to any record release in order to collaborate their care with an outside provider. Patient/Guardian was advised if they have not already done so to contact the registration department to sign all necessary forms in order for [place] to release information regarding their care.   Consent: Patient/Guardian gives verbal consent for treatment and assignment of benefits for services provided during this visit. Patient/Guardian expressed understanding and agreed to proceed.    I provided 30 minutes of non-face-to-face time during this encounter.   Cleotis Nipper, MD

## 2021-12-13 LAB — GLUCOSE, CSF: Glucose, CSF: 68 mg/dL (ref 40–80)

## 2021-12-13 LAB — CSF CULTURE W GRAM STAIN
MICRO NUMBER:: 13553394
Result:: NO GROWTH
SPECIMEN QUALITY:: ADEQUATE

## 2021-12-13 LAB — PROTEIN, CSF: Total Protein, CSF: 37 mg/dL (ref 15–45)

## 2021-12-13 LAB — CSF CELL COUNT WITH DIFFERENTIAL
RBC Count, CSF: 0 cells/uL
TOTAL NUCLEATED CELL: 0 cells/uL (ref 0–5)

## 2021-12-14 ENCOUNTER — Telehealth: Payer: Self-pay | Admitting: Neurology

## 2021-12-14 ENCOUNTER — Telehealth (HOSPITAL_COMMUNITY): Payer: Self-pay | Admitting: Psychiatry

## 2021-12-14 DIAGNOSIS — G971 Other reaction to spinal and lumbar puncture: Secondary | ICD-10-CM

## 2021-12-15 MED ORDER — ONDANSETRON HCL 4 MG PO TABS: 4.0000 mg | ORAL_TABLET | Freq: Three times a day (TID) | ORAL | 0 refills | Status: DC | PRN

## 2021-12-15 NOTE — Telephone Encounter (Signed)
Per patient c/o a headache and nasuea since her LP. Haven't been able keep her food down.   Please advise.

## 2021-12-16 NOTE — Telephone Encounter (Signed)
Telephone call to patient,to see how she is doing and if she spoke to [facility] imaging has reached out.  Per patient, she declined scheduling the Blood Patch. She is doing better Did not need it.

## 2021-12-20 ENCOUNTER — Other Ambulatory Visit: Payer: BC Managed Care – PPO

## 2021-12-23 ENCOUNTER — Encounter (HOSPITAL_COMMUNITY): Payer: Self-pay | Admitting: Psychiatry

## 2021-12-23 ENCOUNTER — Telehealth (HOSPITAL_BASED_OUTPATIENT_CLINIC_OR_DEPARTMENT_OTHER): Payer: BC Managed Care – PPO | Admitting: Psychiatry

## 2021-12-23 DIAGNOSIS — F1210 Cannabis abuse, uncomplicated: Secondary | ICD-10-CM

## 2021-12-23 DIAGNOSIS — F419 Anxiety disorder, unspecified: Secondary | ICD-10-CM

## 2021-12-23 DIAGNOSIS — F331 Major depressive disorder, recurrent, moderate: Secondary | ICD-10-CM | POA: Diagnosis not present

## 2021-12-23 MED ORDER — ESCITALOPRAM OXALATE 20 MG PO TABS: 20.0000 mg | ORAL_TABLET | Freq: Every day | ORAL | 0 refills | Status: DC

## 2021-12-23 MED ORDER — ARIPIPRAZOLE 15 MG PO TABS: 7.5000 mg | ORAL_TABLET | Freq: Every day | ORAL | 1 refills | Status: DC

## 2021-12-23 NOTE — Progress Notes (Signed)
Virtual Visit via Video Note  I connected with Julie [name] on 12/23/21 at  9:40 AM EDT by a video enabled telemedicine application and verified that I am speaking with the correct person using two identifiers.  Location: Patient: Home Provider: Home office   I discussed the limitations of evaluation and management by telemedicine and the availability of in person appointments. The patient expressed understanding and agreed to proceed.  History of Present Illness: Patient is evaluated by video session.  On last visit we started her on Abilify 5 mg.  At that time she was having suicidal thoughts, extreme fatigue, lack of motivation to do things.  She was having ruminative thoughts.  We have recommended IOP but patient did not do IOP due to living in [facility] and cannot come every day.  She was recommended to contact [facility] but patient has not contact them yet.  She feels Abilify helps her energy level and depression.  After a long time she was able to do her hair and taking showers.  She is able to go outside with the mother.  However she still had ruminative thoughts but denies any suicidal thoughts.  She stopped taking the Wellbutrin.  Her sleep is okay as sometimes she has to go to the bathroom after taking the Diamox prescribed by neurology.  She noticed her depression is improved as also noticed improvement in socialization but sometimes she feels tired and fatigued.  She tried taking Abilify in the morning but like to take it at nighttime better.  She has no tremors, shakes or any EPS.  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  She has a Neurosurgeon but she is scared to sign because she needs some research for the company and so far she has not found on the webb and wondering if it is scant.  She lives with her mother.  She has not smoked marijuana  since the last visit.  She is looking forward to have a trip after the summer to [place] to visit her brother and nephew.  She started virtual therapy with Glean Salen based in [facility].  Her appetite is okay.  Her weight is unchanged from the past.   Past Psychiatric History:  H/O inpatient in October 2019 due to severe depression and plan to kill herself.  Did PHP and IOP. No h/o mania, psychosis or any hallucination.    Psychiatric Specialty Exam: Physical Exam  Review of Systems  Weight 274 lb (124.3 kg), last menstrual period 12/07/2021.There is no height or weight on file to calculate BMI.  General Appearance: Fairly Groomed and lying on her bed  Eye Contact:  Fair  Speech:  Slow  Volume:  Decreased  Mood:  Dysphoric  Affect:  Constricted  Thought Process:  Descriptions of Associations: Intact  Orientation:  Full (Time, Place, and Person)  Thought Content:  Rumination  Suicidal Thoughts:  No  Homicidal Thoughts:  No  Memory:  Immediate;   Good Recent;   Good Remote;   Good  Judgement:  Fair  Insight:  Shallow  Psychomotor Activity:  Decreased  Concentration:  Concentration: Fair and Attention Span: Fair  Recall:  Good  Fund of Knowledge:  Good  Language:  Good  Akathisia:  No  Handed:  Right  AIMS (if indicated):     Assets:  Communication Skills Desire for Improvement Housing Social Support  ADL's:  Intact  Cognition:  WNL  Sleep:   fair      Assessment and Plan: Major depressive disorder, recurrent.  Anxiety.  Mild cannabis use.  Patient noticed improvement in her mood and depression since taking the Abilify.  I recommend to increase Abilify 7.5 mg and continue Lexapro 20 mg daily.  She is no longer taking hydroxyzine, Wellbutrin.  She has cut down cannabis use from the past.  Encouraged to continue therapy with Glean Salen.  Patient promised to contact [facility] to consider group therapy.  Recommended to call us back if there is any question  of any concern.  Follow-up in 6 weeks.  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  Follow Up Instructions:    I discussed the assessment and treatment plan with the patient. The patient was provided an opportunity to ask questions and all were answered. The patient agreed with the plan and demonstrated an understanding of the instructions.   The patient was advised to call back or seek an in-person evaluation if the symptoms worsen or if the condition fails to improve as anticipated.  Collaboration of Care: Other provider involved in patient's care AEB notes are available in epic to review.  Patient/Guardian was advised Release of Information must be obtained prior to any record release in order to collaborate their care with an outside provider. Patient/Guardian was advised if they have not already done so to contact the registration department to sign all necessary forms in order for [place] to release information regarding their care.   Consent: Patient/Guardian gives verbal consent for treatment and assignment of benefits for services provided during this visit. Patient/Guardian expressed understanding and agreed to proceed.    I provided 24 minutes of non-face-to-face time during this encounter.   Cleotis Nipper, MD

## 2022-01-04 ENCOUNTER — Other Ambulatory Visit: Payer: BC Managed Care – PPO

## 2022-01-05 DIAGNOSIS — F4310 Post-traumatic stress disorder, unspecified: Secondary | ICD-10-CM | POA: Diagnosis not present

## 2022-01-06 ENCOUNTER — Telehealth (HOSPITAL_COMMUNITY): Payer: BC Managed Care – PPO | Admitting: Psychiatry

## 2022-01-07 DIAGNOSIS — K130 Diseases of lips: Secondary | ICD-10-CM | POA: Diagnosis not present

## 2022-01-15 ENCOUNTER — Other Ambulatory Visit: Payer: Self-pay | Admitting: Neurology

## 2022-01-19 ENCOUNTER — Other Ambulatory Visit: Payer: BC Managed Care – PPO

## 2022-02-03 ENCOUNTER — Encounter (HOSPITAL_COMMUNITY): Payer: Self-pay | Admitting: Psychiatry

## 2022-02-03 ENCOUNTER — Ambulatory Visit
Admission: RE | Admit: 2022-02-03 | Discharge: 2022-02-03 | Disposition: A | Discharge status: Home / Self Care | Payer: BC Managed Care – PPO | Source: Ambulatory Visit | Attending: Neurology | Admitting: Neurology

## 2022-02-03 ENCOUNTER — Telehealth (HOSPITAL_BASED_OUTPATIENT_CLINIC_OR_DEPARTMENT_OTHER): Payer: BC Managed Care – PPO | Admitting: Psychiatry

## 2022-02-03 VITALS — Wt 275.0 lb

## 2022-02-03 DIAGNOSIS — F1210 Cannabis abuse, uncomplicated: Secondary | ICD-10-CM

## 2022-02-03 DIAGNOSIS — H4710 Unspecified papilledema: Secondary | ICD-10-CM

## 2022-02-03 DIAGNOSIS — F419 Anxiety disorder, unspecified: Secondary | ICD-10-CM

## 2022-02-03 DIAGNOSIS — I6501 Occlusion and stenosis of right vertebral artery: Secondary | ICD-10-CM | POA: Diagnosis not present

## 2022-02-03 DIAGNOSIS — F331 Major depressive disorder, recurrent, moderate: Secondary | ICD-10-CM | POA: Diagnosis not present

## 2022-02-03 MED ORDER — BREXPIPRAZOLE 1 MG PO TABS: 1.0000 mg | ORAL_TABLET | Freq: Every day | ORAL | 0 refills | Status: DC

## 2022-02-03 MED ORDER — GADOBENATE DIMEGLUMINE 529 MG/ML IV SOLN
20.0000 mL | Freq: Once | INTRAVENOUS | Status: AC | PRN
  Administered 2022-02-03: 20 mL via INTRAVENOUS

## 2022-02-03 NOTE — Progress Notes (Signed)
Virtual Visit via Video Note  I connected with Julie [name] on 02/03/22 at  2:40 PM EDT by a video enabled telemedicine application and verified that I am speaking with the correct person using two identifiers.  Location: Patient: Home Provider: Home Office   I discussed the limitations of evaluation and management by telemedicine and the availability of in person appointments. The patient expressed understanding and agreed to proceed.  History of Present Illness: Patient is evaluated by video session.  She is having side effects from Abilify.  She feels restless and tired.  She wants to try a different medication.  She like the medicine efficacy because it helps her depression, suicidal thoughts, crying spells and mood swings but does not like side effects.  She continues to smoke but has cut down from the past.  She is taking Lexapro and denies any major panic attack.  She continues to walk every day.  She is getting along with her mother.  She has not able to find a part-time job but is still looking.  She had stopped drinking.  Currently level is fair.  Her appetite is okay.  Her weight is unchanged from the past.  She is in therapy with Glean Salen in [place].   Past Psychiatric History:  H/O inpatient in October 2019 due to severe depression and plan to kill herself.  Did PHP and IOP. No h/o mania, psychosis or any hallucination.  Tried Wellbutrin that did not work.  Abilify made her tired and restless.  Psychiatric Specialty Exam: Physical Exam  Review of Systems  Weight 275 lb (124.7 kg).There is no height or weight on file to calculate BMI.  General Appearance: Casual  Eye Contact:  Good  Speech:  Clear and Coherent and Normal Rate  Volume:  Normal  Mood:  Anxious  Affect:  Congruent  Thought Process:  Goal Directed  Orientation:  Full (Time, Place, and Person)  Thought Content:  Rumination  Suicidal Thoughts:  No  Homicidal Thoughts:  No  Memory:  Immediate;    Good Recent;   Good Remote;   Fair  Judgement:  Intact  Insight:  Present  Psychomotor Activity:  Tremor  Concentration:  Concentration: Good and Attention Span: Good  Recall:  Good  Fund of Knowledge:  Good  Language:  Good  Akathisia:  No  Handed:  Right  AIMS (if indicated):     Assets:  Communication Skills Desire for Improvement Housing Transportation  ADL's:  Intact  Cognition:  WNL  Sleep:   fair      Assessment and Plan: Major depressive disorder, recurrent.  Anxiety.  Mild cannabis use.  Discontinue Abilify as patient complaining of side effects which she described feeling tired and restlessness.  However patient like overall the efficacy and helps her depression and suicidal thoughts.  She liked a similar medication.  We will try REXULTI 0.5 mg-1 mg daily.  Continue Lexapro 20 mg daily.  Discussed stopping the cannabis use as it may interfere with her psychotropic medication.  Encouraged to continue therapy with Glean Salen.  Recommended to call us back if there is any question or any concern.  Follow-up in 2 weeks.  If REXULTI did not work we will consider lamotrigine.  Follow Up Instructions:    I discussed the assessment and treatment plan with the patient. The patient was provided an opportunity to ask questions and all were answered. The patient agreed with the plan and demonstrated an understanding of the instructions.  The patient was advised to call back or seek an in-person evaluation if the symptoms worsen or if the condition fails to improve as anticipated.  Collaboration of Care: Primary Care Provider AEB notes are available in epic to review.  Patient/Guardian was advised Release of Information must be obtained prior to any record release in order to collaborate their care with an outside provider. Patient/Guardian was advised if they have not already done so to contact the registration department to sign all necessary forms in order for [place] to release  information regarding their care.   Consent: Patient/Guardian gives verbal consent for treatment and assignment of benefits for services provided during this visit. Patient/Guardian expressed understanding and agreed to proceed.    I provided 16 minutes of non-face-to-face time during this encounter.   Cleotis Nipper, MD

## 2022-02-05 ENCOUNTER — Telehealth: Payer: Self-pay

## 2022-02-05 NOTE — Telephone Encounter (Signed)
-----   Message from Drema Dallas, DO sent at 02/05/2022  6:32 AM EDT ----- There is narrowing of one of the veins in the head that may be seen in increased intracranial pressure but no evidence of a blood clot, which is what we were looking for.  No change in management.

## 2022-02-05 NOTE — Telephone Encounter (Signed)
Pt called an informed that Dr Everlena Cooper stated the MRV showed  narrowing of one of the veins in the head that may be seen in increased intracranial pressure but no evidence of a blood clot, which is what we were looking for.  No change in management

## 2022-02-16 DIAGNOSIS — F4310 Post-traumatic stress disorder, unspecified: Secondary | ICD-10-CM | POA: Diagnosis not present

## 2022-03-06 DIAGNOSIS — J309 Allergic rhinitis, unspecified: Secondary | ICD-10-CM | POA: Diagnosis not present

## 2022-03-06 DIAGNOSIS — J209 Acute bronchitis, unspecified: Secondary | ICD-10-CM | POA: Diagnosis not present

## 2022-03-16 ENCOUNTER — Encounter (HOSPITAL_COMMUNITY): Payer: Self-pay | Admitting: Psychiatry

## 2022-03-16 ENCOUNTER — Telehealth (HOSPITAL_BASED_OUTPATIENT_CLINIC_OR_DEPARTMENT_OTHER): Payer: BC Managed Care – PPO | Admitting: Psychiatry

## 2022-03-16 DIAGNOSIS — F1210 Cannabis abuse, uncomplicated: Secondary | ICD-10-CM | POA: Diagnosis not present

## 2022-03-16 DIAGNOSIS — F331 Major depressive disorder, recurrent, moderate: Secondary | ICD-10-CM | POA: Diagnosis not present

## 2022-03-16 DIAGNOSIS — F419 Anxiety disorder, unspecified: Secondary | ICD-10-CM

## 2022-03-16 MED ORDER — ESCITALOPRAM OXALATE 20 MG PO TABS: 20.0000 mg | ORAL_TABLET | Freq: Every day | ORAL | 0 refills | Status: DC

## 2022-03-16 MED ORDER — BREXPIPRAZOLE 1 MG PO TABS: 1.0000 mg | ORAL_TABLET | Freq: Every day | ORAL | 2 refills | Status: DC

## 2022-03-16 NOTE — Progress Notes (Signed)
Virtual Visit via Video Note  I connected with Julie [name] on 03/16/22 at 10:40 AM EDT by a video enabled telemedicine application and verified that I am speaking with the correct person using two identifiers.  Location: Patient: Home Provider: Home Office   I discussed the limitations of evaluation and management by telemedicine and the availability of in person appointments. The patient expressed understanding and agreed to proceed.  History of Present Illness: Patient is evaluated by video session.  We started her on REXULTI and she is taking 1 mg.  She feels things are going very well.  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  She is hoping to get a part-time job there since she had work there in the past.  She is getting along with her mother much better.  She is sleeping good.  She denies any crying spells or any feeling of hopelessness or worthlessness.  She denies any anhedonia.  She admitted 3 pounds weight gain because she is not active and not walking or exercising.  However since she had stopped Abilify her restlessness is almost resolved.  She is not having any tremors.  She like the new medication.  She is in therapy with Thurmond Butts in [place].  Past Psychiatric History:  H/O inpatient in October 2019 due to severe depression and plan to kill herself.  Did PHP and IOP. No h/o mania, psychosis. Wellbutrin did not work and Abilify made tired and restless.   Psychiatric Specialty Exam: Physical Exam  Review of Systems  Weight 282 lb (127.9 kg).There is no height or weight on file to calculate BMI.  General Appearance: Casual  Eye Contact:  Good  Speech:  Clear and Coherent  Volume:  Normal  Mood:  Euthymic  Affect:  Appropriate  Thought Process:  Goal Directed  Orientation:  Full (Time, Place, and Person)  Thought Content:  WDL  Suicidal Thoughts:  No  Homicidal Thoughts:  No  Memory:  Immediate;   Good Recent;    Good Remote;   Good  Judgement:  Good  Insight:  Present  Psychomotor Activity:  Normal  Concentration:  Concentration: Good and Attention Span: Good  Recall:  Good  Fund of Knowledge:  Good  Language:  Good  Akathisia:  No  Handed:  Right  AIMS (if indicated):     Assets:  Communication Skills Desire for Improvement Housing Social Support  ADL's:  Intact  Cognition:  WNL  Sleep:   7 hrs      Assessment and Plan: Major depressive disorder, recurrent.  Anxiety.  Mild cannabis use.  Patient doing better with REXULTI 1 mg daily and Lexapro 20 mg daily.  She is in therapy with Thurmond Butts and working on stopping the cannabis use.  She is tolerating well her new medication and feels good since she stopped the Abilify which was causing the restlessness.  Recommended to call us back if she has any question or any concern.  Discussed safety concern that anytime having active suicidal thoughts or homicidal thought then she need to call 911 or go to local emergency room.  I also encouraged to watch her calorie intake and do regular exercise as she has gained 3 pounds since the last visit.  Follow-up in 3 months.  Follow Up Instructions:    I discussed the assessment and treatment plan with the patient. The patient was provided an opportunity to ask questions and all were answered. The  patient agreed with the plan and demonstrated an understanding of the instructions.   The patient was advised to call back or seek an in-person evaluation if the symptoms worsen or if the condition fails to improve as anticipated.  Collaboration of Care: Primary Care Provider AEB notes are in epic to review.   Patient/Guardian was advised Release of Information must be obtained prior to any record release in order to collaborate their care with an outside provider. Patient/Guardian was advised if they have not already done so to contact the registration department to sign all necessary forms in order for [place]  to release information regarding their care.   Consent: Patient/Guardian gives verbal consent for treatment and assignment of benefits for services provided during this visit. Patient/Guardian expressed understanding and agreed to proceed.    I provided 18 minutes of non-face-to-face time during this encounter.   Kathlee Nations, MD

## 2022-03-30 DIAGNOSIS — F4310 Post-traumatic stress disorder, unspecified: Secondary | ICD-10-CM | POA: Diagnosis not present

## 2022-04-14 NOTE — Progress Notes (Signed)
NEUROLOGY FOLLOW UP OFFICE NOTE  Julie [name] [id number]  Assessment/Plan:     1  Idiopathic intracranial hypertension 2  Elevated blood pressure   Restart acetazolamide at 250mg  twice daily (better tolerance) Get repeat eye exam in 8 weeks and will adjust medication accordingly Follow up with PCP regarding elevated blood pressure Follow up with me in 4 to 5 months.     Subjective:  Julie [name] is a 21 year old female who follows up for intracranial hypertension.  UPDATE: Underwent lumbar puncture on 12/09/2021 which revealed an opening pressure of 46 cm water.  MRV of head with and without contrast on 02/03/2022 personally reviewed showed focal stenosis at the junction of the right transverse and sigmoid sinuses but no thrombosis.    Started on acetazolamide 500mg  twice daily.  Initially she decreased dose to once a day because it made her nauseous.  She hasn't been on it since late July - early August because she ran out of refills.  She denies headaches.  Denies blurred vision/visual obscurations.  Every now and then, she may hear her heart beat in her head and a little ringing but nothing significant or often.     HISTORY: She saw her optometrist on 10/29/2021 for routine exam.  Her last eye exam was at least 2 years prior.  Exam revealed bilateral disc edema with irregular margins, worse on right, consistent with papilledema.  She was sent to the ED for imaging where MRI of brain and orbits with and without contrast personally reviewed were normal.  She left before an LP could be performed.     She reports history of dull headaches around the eyes, right worse than left.    They are infrequent but they were daily back in March-April.  She also endorses visual obscurations particularly when she bends over or then gets back up.  Denies pulsatile tinnitus.  She reports that she gained about 10 lbs over the past year.  She is not on birth control medication.    PAST MEDICAL  HISTORY: Past Medical History:  Diagnosis Date   Anxiety    CMV (cytomegalovirus infection) (HCC) 04/13/2018   Depression    Dysmenorrhea    Painful menstrual periods     MEDICATIONS: Current Outpatient Medications on File Prior to Visit  Medication Sig Dispense Refill   ARIPiprazole (ABILIFY) 15 MG tablet Take 0.5 tablets (7.5 mg total) by mouth daily. (Patient not taking: Reported on 02/03/2022) 15 tablet 1   brexpiprazole (REXULTI) 1 MG TABS tablet Take 1 tablet (1 mg total) by mouth daily. 30 tablet 2   buPROPion (WELLBUTRIN XL) 150 MG 24 hr tablet Take 1 tablet (150 mg total) by mouth daily. (Patient not taking: Reported on 12/23/2021) 90 tablet 0   escitalopram (LEXAPRO) 20 MG tablet Take 1 tablet (20 mg total) by mouth daily. 90 tablet 0   hydrOXYzine (ATARAX) 25 MG tablet Take 1 tablet (25 mg total) by mouth 2 (two) times daily as needed for anxiety. (Patient not taking: Reported on 12/23/2021) 30 tablet 0   No current facility-administered medications on file prior to visit.     ALLERGIES: No Known Allergies  FAMILY HISTORY: Family History  Problem Relation Age of Onset   Hypertension Mother    Chronic Renal Failure Father    Depression Father    Alcohol abuse Maternal Uncle    Depression Paternal Uncle    Parkinsonism Maternal Grandmother    Dementia Maternal Grandmother    Stroke  Paternal Grandmother    Dementia Paternal Grandmother    Depression Paternal Grandmother    Depression Paternal Grandfather       Objective:  Blood pressure (!) 169/93, pulse (!) 126, height 5\' 5"  (1.651 m), weight 289 lb (131.1 kg), SpO2 95 %. General: No acute distress.  Patient appears well-groomed.   Head:  Normocephalic/atraumatic Eyes:  Fundi examined but not visualized Neck: supple, no paraspinal tenderness, full range of motion Heart:  Regular rate and rhythm Neurological Exam: alert and oriented to person, place, and time.  Speech fluent and not dysarthric, language intact.   CN II-XII intact. Bulk and tone normal, muscle strength 5/5 throughout.  Sensation to light touch intact.  Deep tendon reflexes 2+ throughout.  Finger to nose testing intact.  Gait normal, Romberg negative.   Metta Clines, DO

## 2022-04-19 ENCOUNTER — Encounter: Payer: Self-pay | Admitting: Neurology

## 2022-04-19 ENCOUNTER — Ambulatory Visit (INDEPENDENT_AMBULATORY_CARE_PROVIDER_SITE_OTHER): Payer: BC Managed Care – PPO | Admitting: Neurology

## 2022-04-19 VITALS — BP 169/93 | HR 126 | Ht 65.0 in | Wt 289.0 lb

## 2022-04-19 DIAGNOSIS — R030 Elevated blood-pressure reading, without diagnosis of hypertension: Secondary | ICD-10-CM | POA: Diagnosis not present

## 2022-04-19 DIAGNOSIS — G932 Benign intracranial hypertension: Secondary | ICD-10-CM | POA: Diagnosis not present

## 2022-04-19 MED ORDER — ACETAZOLAMIDE 250 MG PO TABS
250.0000 mg | ORAL_TABLET | Freq: Two times a day (BID) | ORAL | 5 refills | Status: DC
Start: 2022-04-19 — End: 2023-09-21

## 2022-04-19 NOTE — Patient Instructions (Signed)
Start acetazolamide 250mg  twice daily Get a repeat eye exam in 8 weeks and contact me Follow up with PCP regarding elevated blood pressure Follow up with me in 4-5 months.

## 2022-04-20 DIAGNOSIS — F4310 Post-traumatic stress disorder, unspecified: Secondary | ICD-10-CM | POA: Diagnosis not present

## 2022-05-11 DIAGNOSIS — F4310 Post-traumatic stress disorder, unspecified: Secondary | ICD-10-CM | POA: Diagnosis not present

## 2022-06-01 DIAGNOSIS — F4310 Post-traumatic stress disorder, unspecified: Secondary | ICD-10-CM | POA: Diagnosis not present

## 2022-06-08 ENCOUNTER — Encounter (HOSPITAL_COMMUNITY): Payer: Self-pay | Admitting: Psychiatry

## 2022-06-08 ENCOUNTER — Telehealth (HOSPITAL_BASED_OUTPATIENT_CLINIC_OR_DEPARTMENT_OTHER): Payer: BC Managed Care – PPO | Admitting: Psychiatry

## 2022-06-08 DIAGNOSIS — F331 Major depressive disorder, recurrent, moderate: Secondary | ICD-10-CM

## 2022-06-08 DIAGNOSIS — F1210 Cannabis abuse, uncomplicated: Secondary | ICD-10-CM

## 2022-06-08 DIAGNOSIS — F419 Anxiety disorder, unspecified: Secondary | ICD-10-CM

## 2022-06-08 MED ORDER — BREXPIPRAZOLE 1 MG PO TABS: 1.0000 mg | ORAL_TABLET | Freq: Every day | ORAL | 2 refills | Status: DC

## 2022-06-08 MED ORDER — ESCITALOPRAM OXALATE 20 MG PO TABS: 20.0000 mg | ORAL_TABLET | Freq: Every day | ORAL | 0 refills | Status: DC

## 2022-06-08 NOTE — Progress Notes (Signed)
Virtual Visit via Video Note  I connected with Julie [name] on 06/08/22 at 10:00 AM EST by a video enabled telemedicine application and verified that I am speaking with the correct person using two identifiers.  Location: Patient: Home Provider: Home Office   I discussed the limitations of evaluation and management by telemedicine and the availability of in person appointments. The patient expressed understanding and agreed to proceed.  History of Present Illness: He is evaluated by video session.  She is lying on the bed as recently woke up.  She is compliant with REXULTI and Lexapro.  She reported depression and anxiety is a stable.  She had cut down her cannabis use because of the money.  She usually smoke once or twice a month when she has funds available.  Patient told she dropped out from the training program because she was told that she was not compliant.  She was hoping after finishing the program she will get the job at bed [employer].  Now she has to start all over again.  She feels her irritability is much better and she is getting along with her mother.  She has no tremor or shakes or any EPS.  She admitted 3 pounds weight gain because she is not exercising or walking due to hip pain.  She is taking ibuprofen.  She denies any panic attack or any crying spells.  She denies any anhedonia or any feeling of hopelessness or worthlessness.  Patient told they do not celebrate holidays and her plan is to stay home.  She is in therapy with Glean Salen.  Patient like to keep the current medication.  Her sleep is good.  Past Psychiatric History:  H/O inpatient in October 2019 due to severe depression and plan to kill herself.  Did PHP and IOP. No h/o mania, psychosis. Wellbutrin did not work and Abilify made tired and restless.   Psychiatric Specialty Exam: Physical Exam  Review of Systems  Weight 289 lb (131.1 kg).There is no height or weight on file to calculate BMI.  General Appearance:  Casual  Eye Contact:  Fair  Speech:  Normal Rate  Volume:  Normal  Mood:  Euthymic  Affect:  Appropriate  Thought Process:  Goal Directed  Orientation:  Full (Time, Place, and Person)  Thought Content:  WDL  Suicidal Thoughts:  No  Homicidal Thoughts:  No  Memory:  Immediate;   Good Recent;   Good Remote;   Good  Judgement:  Fair  Insight:  Shallow  Psychomotor Activity:  Normal  Concentration:  Concentration: Good and Attention Span: Good  Recall:  Good  Fund of Knowledge:  Good  Language:  Good  Akathisia:  No  Handed:  Right  AIMS (if indicated):     Assets:  Communication Skills Desire for Improvement Housing Social Support Transportation  ADL's:  Intact  Cognition:  WNL  Sleep:   ok      Assessment and Plan: Major depressive disorder, recurrent.  Anxiety.  Mild cannabis use.  Patient is stable on REXULTI 1 mg and Lexapro 20 mg daily.  She cut down her cannabis but is still smoke once or twice a month when she has funds available.  Encouraged to continue therapy with Glean Salen.  Patient is working to find a stable job.  Discussed medication side effects and benefits.  Recommended to call us back if she has any question or any concern.  Follow-up in 3 months.  Follow Up Instructions:  I discussed the assessment and treatment plan with the patient. The patient was provided an opportunity to ask questions and all were answered. The patient agreed with the plan and demonstrated an understanding of the instructions.   The patient was advised to call back or seek an in-person evaluation if the symptoms worsen or if the condition fails to improve as anticipated.  Collaboration of Care: Other provider involved in patient's care AEB notes are available in epic to review.  Patient/Guardian was advised Release of Information must be obtained prior to any record release in order to collaborate their care with an outside provider. Patient/Guardian was advised if they  have not already done so to contact the registration department to sign all necessary forms in order for [place] to release information regarding their care.   Consent: Patient/Guardian gives verbal consent for treatment and assignment of benefits for services provided during this visit. Patient/Guardian expressed understanding and agreed to proceed.    I provided 14 minutes of non-face-to-face time during this encounter.   Cleotis Nipper, MD

## 2022-06-29 DIAGNOSIS — F4310 Post-traumatic stress disorder, unspecified: Secondary | ICD-10-CM | POA: Diagnosis not present

## 2022-07-04 ENCOUNTER — Other Ambulatory Visit (HOSPITAL_COMMUNITY): Payer: Self-pay | Admitting: Psychiatry

## 2022-07-04 DIAGNOSIS — F419 Anxiety disorder, unspecified: Secondary | ICD-10-CM

## 2022-07-04 DIAGNOSIS — F1210 Cannabis abuse, uncomplicated: Secondary | ICD-10-CM

## 2022-07-04 DIAGNOSIS — F331 Major depressive disorder, recurrent, moderate: Secondary | ICD-10-CM

## 2022-07-27 DIAGNOSIS — F4310 Post-traumatic stress disorder, unspecified: Secondary | ICD-10-CM | POA: Diagnosis not present

## 2022-08-20 ENCOUNTER — Ambulatory Visit: Payer: BC Managed Care – PPO | Admitting: Neurology

## 2022-08-24 DIAGNOSIS — F4310 Post-traumatic stress disorder, unspecified: Secondary | ICD-10-CM | POA: Diagnosis not present

## 2022-09-07 ENCOUNTER — Telehealth (HOSPITAL_COMMUNITY): Payer: BC Managed Care – PPO | Admitting: Psychiatry

## 2022-09-08 ENCOUNTER — Encounter (HOSPITAL_COMMUNITY): Payer: Self-pay | Admitting: Psychiatry

## 2022-09-08 ENCOUNTER — Telehealth (HOSPITAL_BASED_OUTPATIENT_CLINIC_OR_DEPARTMENT_OTHER): Payer: BLUE CROSS/BLUE SHIELD | Admitting: Psychiatry

## 2022-09-08 DIAGNOSIS — F419 Anxiety disorder, unspecified: Secondary | ICD-10-CM

## 2022-09-08 DIAGNOSIS — F331 Major depressive disorder, recurrent, moderate: Secondary | ICD-10-CM

## 2022-09-08 DIAGNOSIS — F1210 Cannabis abuse, uncomplicated: Secondary | ICD-10-CM | POA: Diagnosis not present

## 2022-09-08 MED ORDER — ESCITALOPRAM OXALATE 20 MG PO TABS: 20.0000 mg | ORAL_TABLET | Freq: Every day | ORAL | 0 refills | Status: DC

## 2022-09-08 MED ORDER — BREXPIPRAZOLE 1 MG PO TABS: 1.0000 mg | ORAL_TABLET | Freq: Every day | ORAL | 2 refills | Status: DC

## 2022-09-08 NOTE — Progress Notes (Signed)
[facility] MD Virtual Progress Note   Patient Location: Home Provider Location: Home Office  I connect with patient by video and verified that I am speaking with correct person by using two identifiers. I discussed the limitations of evaluation and management by telemedicine and the availability of in person appointments. I also discussed with the patient that there may be a patient responsible charge related to this service. The patient expressed understanding and agreed to proceed.  Julie [name] [id number] 21 y.o.  09/08/2022 2:45 PM  History of Present Illness:  Patient is evaluated by video session.  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  She does started 2 days ago and trying to adjust with the work.  She is taking REXULTI and Lexapro and she feels it is seems to be working.  She cut down her cannabis use and occasionally she uses edible.  She admitted last weekend drinking whiskey with her parents and that did not go very well with the medication.  She has learned that she will need to avoid drinking due to interaction with medication.  She admitted her appetite is okay and she may have a weight gain but did not have it checked recently.  She admitted does not go outside, walking on a regular basis.  She denies any crying spells or any feeling of hopelessness or worthlessness.  She denies any panic attack or any suicidal thoughts.  She has no tremors, shakes or any EPS.  She compliant with her therapist Janett Labella once a month.  Patient like to keep the current medication.  Past Psychiatric History: H/O inpatient in October 2019 due to severe depression and plan to kill herself.  Did PHP and IOP. No h/o mania, psychosis. Wellbutrin did not work and Abilify made tired and restless.  Hydroxyzine helped but discontinued after feeling better.   Outpatient Encounter Medications as of 09/08/2022  Medication Sig   acetaZOLAMIDE  (DIAMOX) 250 MG tablet Take 1 tablet (250 mg total) by mouth 2 (two) times daily. (Patient not taking: Reported on 04/19/2022)   brexpiprazole (REXULTI) 1 MG TABS tablet Take 1 tablet (1 mg total) by mouth daily.   escitalopram (LEXAPRO) 20 MG tablet Take 1 tablet (20 mg total) by mouth daily.   hydrOXYzine (ATARAX) 25 MG tablet Take 1 tablet (25 mg total) by mouth 2 (two) times daily as needed for anxiety. (Patient not taking: Reported on 06/08/2022)   No facility-administered encounter medications on file as of 09/08/2022.    No results found for this or any previous visit (from the past 2160 hour(s)).   Psychiatric Specialty Exam: Physical Exam  Review of Systems  Weight 289 lb (131.1 kg).There is no height or weight on file to calculate BMI.  General Appearance: Casual  Eye Contact:  Good  Speech:  Normal Rate  Volume:  Normal  Mood:  Euthymic  Affect:  Appropriate  Thought Process:  Goal Directed  Orientation:  Full (Time, Place, and Person)  Thought Content:  Logical  Suicidal Thoughts:  No  Homicidal Thoughts:  No  Memory:  Immediate;   Good Recent;   Good Remote;   Fair  Judgement:  Intact  Insight:  Shallow  Psychomotor Activity:  Normal  Concentration:  Concentration: Good and Attention Span: Good  Recall:  Good  Fund of Knowledge:  Good  Language:  Good  Akathisia:  No  Handed:  Right  AIMS (if indicated):  Assets:  Armed forces logistics/support/administrative officer Desire for Improvement Housing Resilience Social Support Transportation  ADL's:  Intact  Cognition:  WNL  Sleep:  ok     Assessment/Plan: Anxiety - Plan: brexpiprazole (REXULTI) 1 MG TABS tablet, escitalopram (LEXAPRO) 20 MG tablet  MDD (major depressive disorder), recurrent episode, moderate ([place]) - Plan: brexpiprazole (REXULTI) 1 MG TABS tablet, escitalopram (LEXAPRO) 20 MG tablet  Mild tetrahydrocannabinol (THC) abuse - Plan: brexpiprazole (REXULTI) 1 MG TABS tablet, escitalopram (LEXAPRO) 20 MG tablet  Patient  is stable on current medication.  Encourage walking, exercise and check her weight.  She also need to see the PCP for physical and blood work.  Patient has cut down her cannabis use significantly.  Discussed not to drink alcohol due to interaction with psychotropic medication.  Patient acknowledged and agreed.  Continue REXULTI 1 mg daily and Lexapro 20 mg daily.  Encouraged to continue therapy with her therapist.  Patient is now working and trying to adjust with her new job.  Recommended to call us back if she has any question or any concern.  Follow-up in 3 months.   Follow Up Instructions:     I discussed the assessment and treatment plan with the patient. The patient was provided an opportunity to ask questions and all were answered. The patient agreed with the plan and demonstrated an understanding of the instructions.   The patient was advised to call back or seek an in-person evaluation if the symptoms worsen or if the condition fails to improve as anticipated.    Collaboration of Care: Other provider involved in patient's care AEB notes are available in epic to review.  Patient/Guardian was advised Release of Information must be obtained prior to any record release in order to collaborate their care with an outside provider. Patient/Guardian was advised if they have not already done so to contact the registration department to sign all necessary forms in order for [place] to release information regarding their care.   Consent: Patient/Guardian gives verbal consent for treatment and assignment of benefits for services provided during this visit. Patient/Guardian expressed understanding and agreed to proceed.     I provided 20 minutes of non face to face time during this encounter.  Kathlee Nations, MD 09/08/2022

## 2022-09-18 IMAGING — XA DG SPINAL PUNCT LUMBAR DIAG WITH FL CT GUIDANCE
2 series · 2 of 2 positions shown · non-contrast
Comparison: None available

CLINICAL DATA: Headaches

Papilledema
EXAM:
DIAGNOSTIC LUMBAR PUNCTURE UNDER FLUOROSCOPIC GUIDANCE

[Series 1: ortho adipose · 1 of 1 slices shown (1 of 2)]
[im 1/1]
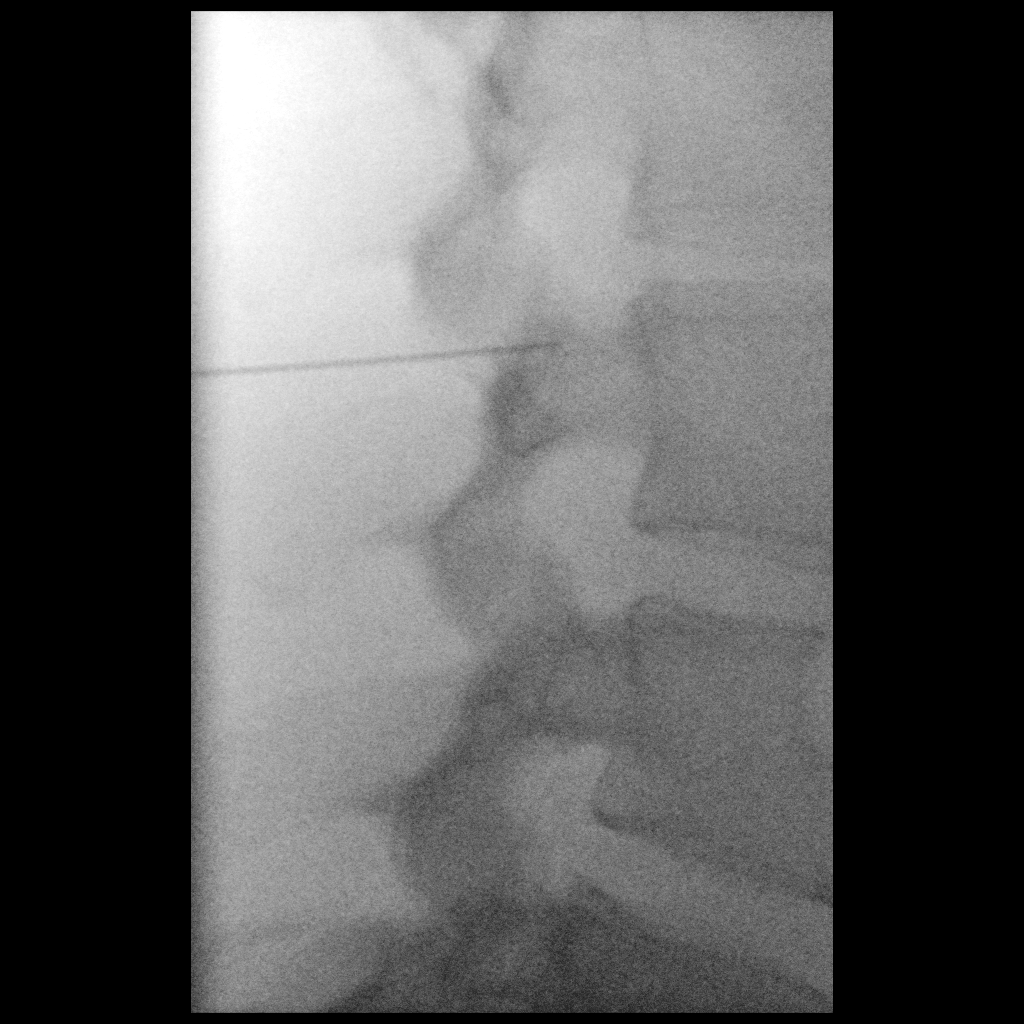

[Series 2: ortho adipose · 1 of 1 slices shown (2 of 2)]
[im 1/1]
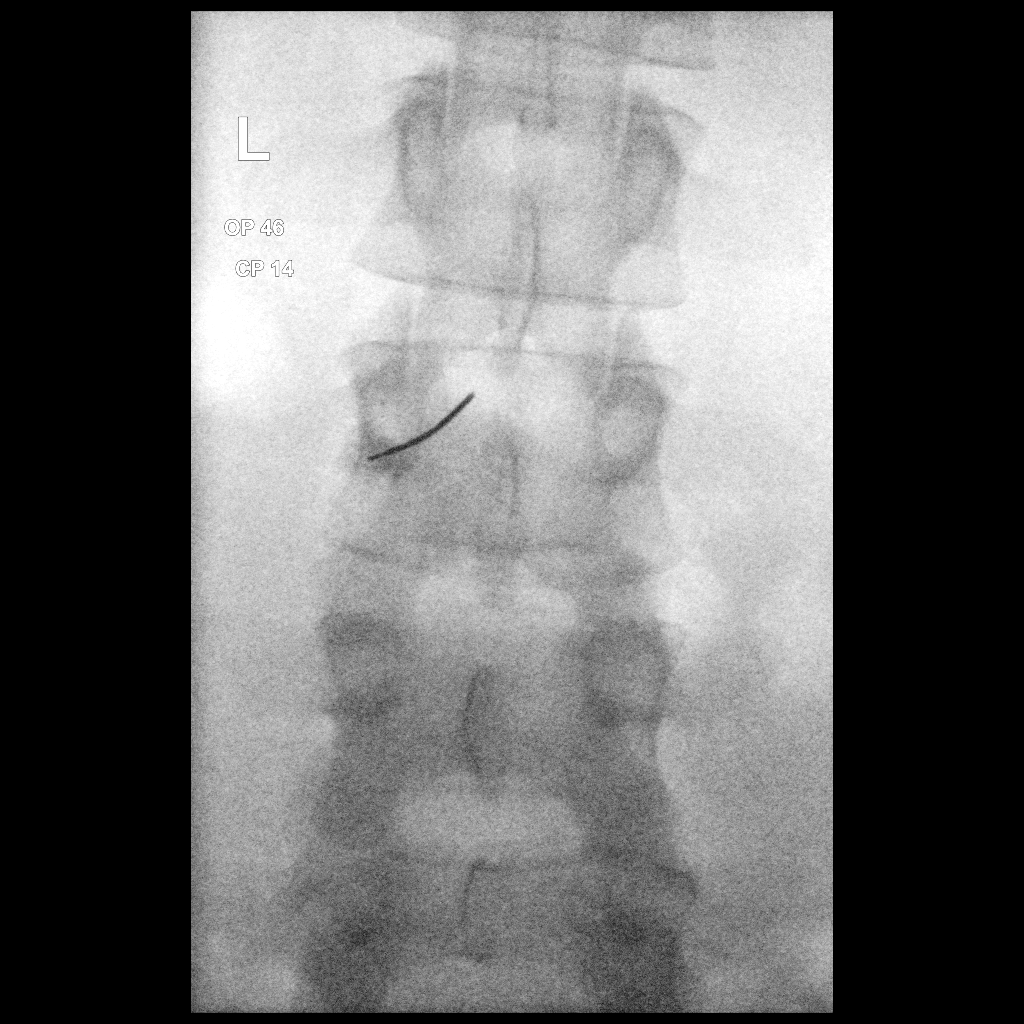

[2 of 2 positions shown; findings below may reference images not displayed]

FLUOROSCOPY:
Radiation Exposure Index (as provided by the fluoroscopic device):
4.1 mGy Kerma

PROCEDURE:
Informed consent was obtained from the patient prior to the
procedure, including potential complications of headache, allergy,
and pain. With the patient prone, the lower back was prepped with
Betadine. 1% Lidocaine was used for local anesthesia. Lumbar
puncture was performed at the L2-L3 level using a 20 gauge needle
with return of clear CSF with an opening pressure of 46 cm water. 24
ml of CSF were obtained for laboratory studies and to decrease the
CSF pressure. The patient tolerated the procedure well and there
were no apparent complications. Closing pressure measured 14 cm H2O.
IMPRESSION: Diagnostic and therapeutic lumbar puncture, decreasing opening
pressure from 46 cm H2O to 14 cm H2O.

## 2022-10-19 DIAGNOSIS — F4310 Post-traumatic stress disorder, unspecified: Secondary | ICD-10-CM | POA: Diagnosis not present

## 2022-11-18 DIAGNOSIS — F4310 Post-traumatic stress disorder, unspecified: Secondary | ICD-10-CM | POA: Diagnosis not present

## 2022-12-07 ENCOUNTER — Telehealth (HOSPITAL_BASED_OUTPATIENT_CLINIC_OR_DEPARTMENT_OTHER): Payer: BLUE CROSS/BLUE SHIELD | Admitting: Psychiatry

## 2022-12-07 ENCOUNTER — Encounter (HOSPITAL_COMMUNITY): Payer: Self-pay | Admitting: Psychiatry

## 2022-12-07 VITALS — Wt 290.0 lb

## 2022-12-07 DIAGNOSIS — F419 Anxiety disorder, unspecified: Secondary | ICD-10-CM | POA: Diagnosis not present

## 2022-12-07 DIAGNOSIS — F1210 Cannabis abuse, uncomplicated: Secondary | ICD-10-CM | POA: Diagnosis not present

## 2022-12-07 DIAGNOSIS — F331 Major depressive disorder, recurrent, moderate: Secondary | ICD-10-CM | POA: Diagnosis not present

## 2022-12-07 MED ORDER — BREXPIPRAZOLE 1 MG PO TABS: 1.0000 mg | ORAL_TABLET | Freq: Every day | ORAL | 2 refills | Status: DC

## 2022-12-07 MED ORDER — ESCITALOPRAM OXALATE 20 MG PO TABS: 20.0000 mg | ORAL_TABLET | Freq: Every day | ORAL | 0 refills | Status: DC

## 2022-12-07 NOTE — Progress Notes (Signed)
[facility] MD Virtual Progress Note   Patient Location: Home  Provider Location: Home Office  I connect with patient by video and verified that I am speaking with correct person by using two identifiers. I discussed the limitations of evaluation and management by telemedicine and the availability of in person appointments. I also discussed with the patient that there may be a patient responsible charge related to this service. The patient expressed understanding and agreed to proceed.  Julie [name] [id number] 22 y.o.  12/07/2022 2:59 PM  History of Present Illness:  Patient is evaluated by video session.  She quit her job because of the issue with the schedules.  Currently she is not looking for a job but eventually she will look into it.  She admitted does not leave the house unless it is important.  Patient lives with her mother.  She feels the REXULTI on Lexapro working and she denies any crying spells, feeling of hopelessness or any depressive thoughts.  She stopped smoking marijuana but is still used Gummies.  She has no tremor or shakes or any EPS.  She admitted not able to get labs or visit with the primary care because she does not have any current PCP.  We have recommended labs but have not done in a while.  She like to keep the current medication.  She is not as active as she would like to be as not doing any exercise or watching her calorie intake.  She is not sure what is her weight but may have gained from the past.  She wants to keep the current medication.  She is seen her therapist once a month with Tanika McCoy.  Past Psychiatric History: H/O inpatient in October 2019 due to severe depression and plan to kill herself.  Did PHP and IOP. No h/o mania, psychosis. Wellbutrin did not work and Abilify made tired and restless.  Hydroxyzine helped but discontinued after feeling better.     Outpatient Encounter Medications as of 12/07/2022  Medication Sig    acetaZOLAMIDE (DIAMOX) 250 MG tablet Take 1 tablet (250 mg total) by mouth 2 (two) times daily. (Patient not taking: Reported on 04/19/2022)   brexpiprazole (REXULTI) 1 MG TABS tablet Take 1 tablet (1 mg total) by mouth daily.   escitalopram (LEXAPRO) 20 MG tablet Take 1 tablet (20 mg total) by mouth daily.   hydrOXYzine (ATARAX) 25 MG tablet Take 1 tablet (25 mg total) by mouth 2 (two) times daily as needed for anxiety. (Patient not taking: Reported on 06/08/2022)   No facility-administered encounter medications on file as of 12/07/2022.    No results found for this or any previous visit (from the past 2160 hour(s)).   Psychiatric Specialty Exam: Physical Exam  Review of Systems  Weight 290 lb (131.5 kg).There is no height or weight on file to calculate BMI.  General Appearance: Fairly Groomed  Eye Contact:  Fair  Speech:  Slow  Volume:  Decreased  Mood:  Euthymic  Affect:  Appropriate  Thought Process:  Goal Directed  Orientation:  Full (Time, Place, and Person)  Thought Content:  Logical  Suicidal Thoughts:  No  Homicidal Thoughts:  No  Memory:  Immediate;   Good Recent;   Good Remote;   Good  Judgement:  Fair  Insight:  Present  Psychomotor Activity:  Normal  Concentration:  Concentration: Fair and Attention Span: Fair  Recall:  Good  Fund of Knowledge:  Good  Language:  Good  Akathisia:  No  Handed:  Right  AIMS (if indicated):     Assets:  Communication Skills Desire for Improvement Housing Transportation  ADL's:  Intact  Cognition:  WNL  Sleep:  ok     Assessment/Plan: MDD (major depressive disorder), recurrent episode, moderate (HCC) - Plan: brexpiprazole (REXULTI) 1 MG TABS tablet, escitalopram (LEXAPRO) 20 MG tablet  Anxiety - Plan: brexpiprazole (REXULTI) 1 MG TABS tablet, escitalopram (LEXAPRO) 20 MG tablet  Mild tetrahydrocannabinol (THC) abuse - Plan: brexpiprazole (REXULTI) 1 MG TABS tablet, escitalopram (LEXAPRO) 20 MG tablet  Patient is stable on  current medication.  She quit the job like to take some time off until she finds a new job.  Encouraged lab work and patient is willing to come to our office for routine blood work.  We will order CBC, CMP and hemoglobin A1c.  Continue REXULTI 1 mg daily and Lexapro 20 mg daily.  Recommend to call us back if she has any question or any concern.  Follow-up in 3 months.   Follow Up Instructions:     I discussed the assessment and treatment plan with the patient. The patient was provided an opportunity to ask questions and all were answered. The patient agreed with the plan and demonstrated an understanding of the instructions.   The patient was advised to call back or seek an in-person evaluation if the symptoms worsen or if the condition fails to improve as anticipated.    Collaboration of Care: Other provider involved in patient's care AEB notes are available in epic to review.  Patient/Guardian was advised Release of Information must be obtained prior to any record release in order to collaborate their care with an outside provider. Patient/Guardian was advised if they have not already done so to contact the registration department to sign all necessary forms in order for [place] to release information regarding their care.   Consent: Patient/Guardian gives verbal consent for treatment and assignment of benefits for services provided during this visit. Patient/Guardian expressed understanding and agreed to proceed.     I provided 20 minutes of non face to face time during this encounter.  Note: This document was prepared by [employer] voice dictation technology and any errors that results from this process are unintentional.    Cleotis Nipper, MD 12/07/2022

## 2022-12-15 DIAGNOSIS — F4310 Post-traumatic stress disorder, unspecified: Secondary | ICD-10-CM | POA: Diagnosis not present

## 2023-01-13 DIAGNOSIS — F4310 Post-traumatic stress disorder, unspecified: Secondary | ICD-10-CM | POA: Diagnosis not present

## 2023-02-10 DIAGNOSIS — F4310 Post-traumatic stress disorder, unspecified: Secondary | ICD-10-CM | POA: Diagnosis not present

## 2023-02-21 ENCOUNTER — Other Ambulatory Visit (HOSPITAL_COMMUNITY): Payer: Self-pay | Admitting: Psychiatry

## 2023-02-21 DIAGNOSIS — F419 Anxiety disorder, unspecified: Secondary | ICD-10-CM

## 2023-02-21 DIAGNOSIS — F1210 Cannabis abuse, uncomplicated: Secondary | ICD-10-CM

## 2023-02-21 DIAGNOSIS — F331 Major depressive disorder, recurrent, moderate: Secondary | ICD-10-CM

## 2023-02-22 ENCOUNTER — Other Ambulatory Visit (HOSPITAL_COMMUNITY): Payer: Self-pay | Admitting: *Deleted

## 2023-02-22 DIAGNOSIS — Z79899 Other long term (current) drug therapy: Secondary | ICD-10-CM

## 2023-03-02 DIAGNOSIS — Z79899 Other long term (current) drug therapy: Secondary | ICD-10-CM | POA: Diagnosis not present

## 2023-03-03 LAB — CMP14+EGFR
ALT: 12 IU/L (ref 0–32)
AST: 12 IU/L (ref 0–40)
Albumin: 3.6 g/dL — ABNORMAL LOW (ref 4.0–5.0)
Alkaline Phosphatase: 64 IU/L (ref 44–121)
BUN/Creatinine Ratio: 9 (ref 9–23)
BUN: 6 mg/dL (ref 6–20)
Bilirubin Total: 0.2 mg/dL (ref 0.0–1.2)
CO2: 23 mmol/L (ref 20–29)
Calcium: 8.7 mg/dL (ref 8.7–10.2)
Chloride: 105 mmol/L (ref 96–106)
Creatinine, Ser: 0.7 mg/dL (ref 0.57–1.00)
Globulin, Total: 3.1 g/dL (ref 1.5–4.5)
Glucose: 92 mg/dL (ref 70–99)
Potassium: 4.3 mmol/L (ref 3.5–5.2)
Sodium: 141 mmol/L (ref 134–144)
Total Protein: 6.7 g/dL (ref 6.0–8.5)
eGFR: 124 mL/min/{1.73_m2} (ref >59)

## 2023-03-03 LAB — CBC WITH DIFFERENTIAL/PLATELET
Basophils Absolute: 0 10*3/uL (ref 0.0–0.2)
Basos: 1 %
EOS (ABSOLUTE): 0.3 10*3/uL (ref 0.0–0.4)
Eos: 4 %
Hematocrit: 31.7 % — ABNORMAL LOW (ref 34.0–46.6)
Hemoglobin: 10 g/dL — ABNORMAL LOW (ref 11.1–15.9)
Immature Grans (Abs): 0 10*3/uL (ref 0.0–0.1)
Immature Granulocytes: 0 %
Lymphocytes Absolute: 3.2 10*3/uL — ABNORMAL HIGH (ref 0.7–3.1)
Lymphs: 38 %
MCH: 24.3 pg — ABNORMAL LOW (ref 26.6–33.0)
MCHC: 31.5 g/dL (ref 31.5–35.7)
MCV: 77 fL — ABNORMAL LOW (ref 79–97)
Monocytes Absolute: 0.6 10*3/uL (ref 0.1–0.9)
Monocytes: 7 %
Neutrophils Absolute: 4.2 10*3/uL (ref 1.4–7.0)
Neutrophils: 50 %
Platelets: 365 10*3/uL (ref 150–450)
RBC: 4.12 x10E6/uL (ref 3.77–5.28)
RDW: 14.2 % (ref 11.7–15.4)
WBC: 8.3 10*3/uL (ref 3.4–10.8)

## 2023-03-03 LAB — HEMOGLOBIN A1C
Est. average glucose Bld gHb Est-mCnc: 134 mg/dL
Hgb A1c MFr Bld: 6.3 % — ABNORMAL HIGH (ref 4.8–5.6)

## 2023-03-08 ENCOUNTER — Encounter (HOSPITAL_COMMUNITY): Payer: Self-pay | Admitting: Psychiatry

## 2023-03-08 ENCOUNTER — Telehealth (HOSPITAL_BASED_OUTPATIENT_CLINIC_OR_DEPARTMENT_OTHER): Payer: BLUE CROSS/BLUE SHIELD | Admitting: Psychiatry

## 2023-03-08 VITALS — Wt 291.0 lb

## 2023-03-08 DIAGNOSIS — F419 Anxiety disorder, unspecified: Secondary | ICD-10-CM

## 2023-03-08 DIAGNOSIS — F411 Generalized anxiety disorder: Secondary | ICD-10-CM | POA: Diagnosis not present

## 2023-03-08 DIAGNOSIS — F1210 Cannabis abuse, uncomplicated: Secondary | ICD-10-CM

## 2023-03-08 DIAGNOSIS — F331 Major depressive disorder, recurrent, moderate: Secondary | ICD-10-CM

## 2023-03-08 MED ORDER — HYDROXYZINE HCL 25 MG PO TABS: 25.0000 mg | ORAL_TABLET | Freq: Two times a day (BID) | ORAL | 0 refills | Status: DC | PRN

## 2023-03-08 MED ORDER — BREXPIPRAZOLE 1 MG PO TABS: 1.0000 mg | ORAL_TABLET | Freq: Every day | ORAL | 2 refills | Status: DC

## 2023-03-08 NOTE — Progress Notes (Signed)
[facility] MD Virtual Progress Note   Patient Location: Home Provider Location: Home Office  I connect with patient by video and verified that I am speaking with correct person by using two identifiers. I discussed the limitations of evaluation and management by telemedicine and the availability of in person appointments. I also discussed with the patient that there may be a patient responsible charge related to this service. The patient expressed understanding and agreed to proceed.  Julie [name] [id number] 22 y.o.  03/08/2023 2:58 PM  History of Present Illness:  Patient is evaluated by video session.  She is taking REXULTI on Lexapro which she believes helping her depression and anxiety.  She recently had a blood work and her hemoglobin A1c is 6.3 and she has anemia.  She also tried to get in touch with vocational rehab and she has assessment 3 weeks ago.  She is hoping in next few weeks her initial meeting with vocational rehab will go to the next.  She sleeps okay.  She admitted still smoke marijuana 4-5 times a week but less intense as compared with the previous.  She denies any feeling of hopelessness or worthlessness.  Denied any paranoia, suicidal thoughts or any homicidal thoughts.  She has appointment coming up with primary care to address her hemoglobin A1c and weight.  She started going outside and watching her calorie intake.  She is concerned about her health.  She lives with her mother and she is getting along with her very well.  She denies any anger, mood swings, irritability.  She is in therapy once someone with Temeka McCoy.  Past Psychiatric History: H/O inpatient in October 2019 due to severe depression and plan to kill herself.  Did PHP and IOP. No h/o mania, psychosis. Wellbutrin did not work and Abilify made tired and restless.  Hydroxyzine helped but discontinued after feeling better.    Outpatient Encounter Medications as of 03/08/2023  Medication Sig    acetaZOLAMIDE (DIAMOX) 250 MG tablet Take 1 tablet (250 mg total) by mouth 2 (two) times daily. (Patient not taking: Reported on 04/19/2022)   brexpiprazole (REXULTI) 1 MG TABS tablet Take 1 tablet (1 mg total) by mouth daily.   escitalopram (LEXAPRO) 20 MG tablet Take 1 tablet (20 mg total) by mouth daily.   hydrOXYzine (ATARAX) 25 MG tablet Take 1 tablet (25 mg total) by mouth 2 (two) times daily as needed for anxiety. (Patient not taking: Reported on 06/08/2022)   No facility-administered encounter medications on file as of 03/08/2023.    Recent Results (from the past 2160 hour(s))  CBC with Differential     Status: Abnormal   Collection Time: 03/02/23  2:19 PM  Result Value Ref Range   WBC 8.3 3.4 - 10.8 x10E3/uL   RBC 4.12 3.77 - 5.28 x10E6/uL   Hemoglobin 10.0 (L) 11.1 - 15.9 g/dL   Hematocrit 40.9 (L) 81.1 - 46.6 %   MCV 77 (L) 79 - 97 fL   MCH 24.3 (L) 26.6 - 33.0 pg   MCHC 31.5 31.5 - 35.7 g/dL   RDW 91.4 78.2 - 95.6 %   Platelets 365 150 - 450 x10E3/uL   Neutrophils 50 Not Estab. %   Lymphs 38 Not Estab. %   Monocytes 7 Not Estab. %   Eos 4 Not Estab. %   Basos 1 Not Estab. %   Neutrophils Absolute 4.2 1.4 - 7.0 x10E3/uL   Lymphocytes Absolute 3.2 (H) 0.7 - 3.1 x10E3/uL   Monocytes  Absolute 0.6 0.1 - 0.9 x10E3/uL   EOS (ABSOLUTE) 0.3 0.0 - 0.4 x10E3/uL   Basophils Absolute 0.0 0.0 - 0.2 x10E3/uL   Immature Granulocytes 0 Not Estab. %   Immature Grans (Abs) 0.0 0.0 - 0.1 x10E3/uL  CMP14+EGFR     Status: Abnormal   Collection Time: 03/02/23  2:19 PM  Result Value Ref Range   Glucose 92 70 - 99 mg/dL   BUN 6 6 - 20 mg/dL   Creatinine, Ser 8.41 0.57 - 1.00 mg/dL   eGFR 660 >63 KZ/SWF/0.93   BUN/Creatinine Ratio 9 9 - 23   Sodium 141 134 - 144 mmol/L   Potassium 4.3 3.5 - 5.2 mmol/L   Chloride 105 96 - 106 mmol/L   CO2 23 20 - 29 mmol/L   Calcium 8.7 8.7 - 10.2 mg/dL   Total Protein 6.7 6.0 - 8.5 g/dL   Albumin 3.6 (L) 4.0 - 5.0 g/dL   Globulin, Total 3.1 1.5 -  4.5 g/dL   Bilirubin Total 0.2 0.0 - 1.2 mg/dL   Alkaline Phosphatase 64 44 - 121 IU/L   AST 12 0 - 40 IU/L   ALT 12 0 - 32 IU/L  HgB A1c     Status: Abnormal   Collection Time: 03/02/23  2:19 PM  Result Value Ref Range   Hgb A1c MFr Bld 6.3 (H) 4.8 - 5.6 %    Comment:          Prediabetes: 5.7 - 6.4          Diabetes: >6.4          Glycemic control for adults with diabetes: <7.0    Est. average glucose Bld gHb Est-mCnc 134 mg/dL     Psychiatric Specialty Exam: Physical Exam  Review of Systems  Weight 291 lb (132 kg).There is no height or weight on file to calculate BMI.  General Appearance: Casual  Eye Contact:  Fair  Speech:  Slow  Volume:  Decreased  Mood:  Euthymic  Affect:  Congruent  Thought Process:  Goal Directed  Orientation:  Full (Time, Place, and Person)  Thought Content:  WDL  Suicidal Thoughts:  No  Homicidal Thoughts:  No  Memory:  Immediate;   Good Recent;   Good Remote;   Fair  Judgement:  Fair  Insight:  Present  Psychomotor Activity:  Decreased  Concentration:  Concentration: Fair and Attention Span: Fair  Recall:  Good  Fund of Knowledge:  Good  Language:  Good  Akathisia:  No  Handed:  Right  AIMS (if indicated):     Assets:  Communication Skills Desire for Improvement Housing Transportation  ADL's:  Intact  Cognition:  WNL  Sleep:  ok     Assessment/Plan: MDD (major depressive disorder), recurrent episode, moderate (HCC) - Plan: brexpiprazole (REXULTI) 1 MG TABS tablet  Anxiety - Plan: brexpiprazole (REXULTI) 1 MG TABS tablet  Mild tetrahydrocannabinol (THC) abuse - Plan: brexpiprazole (REXULTI) 1 MG TABS tablet  GAD (generalized anxiety disorder) - Plan: hydrOXYzine (ATARAX) 25 MG tablet  Patient is stable on current medication.  I discussed blood work results with her.  She understands she need to work on her diet, exercise.  She also like to address her anemia with her PCP and appointment coming up in next week.  I encouraged to  continue therapy with clinical McCoy.  Continue REXULTI 1 mg daily Lexapro 20 mg daily.  Recommend to call us back if she has any question or any concern.  Follow-up  in 3 months   Follow Up Instructions:     I discussed the assessment and treatment plan with the patient. The patient was provided an opportunity to ask questions and all were answered. The patient agreed with the plan and demonstrated an understanding of the instructions.   The patient was advised to call back or seek an in-person evaluation if the symptoms worsen or if the condition fails to improve as anticipated.    Collaboration of Care: Other provider involved in patient's care AEB notes are available in epic to review  Patient/Guardian was advised Release of Information must be obtained prior to any record release in order to collaborate their care with an outside provider. Patient/Guardian was advised if they have not already done so to contact the registration department to sign all necessary forms in order for [place] to release information regarding their care.   Consent: Patient/Guardian gives verbal consent for treatment and assignment of benefits for services provided during this visit. Patient/Guardian expressed understanding and agreed to proceed.     I provided 19 minutes of non face to face time during this encounter.  Note: This document was prepared by [employer] voice dictation technology and any errors that results from this process are unintentional.    Cleotis Nipper, MD 03/08/2023

## 2023-03-09 DIAGNOSIS — F4310 Post-traumatic stress disorder, unspecified: Secondary | ICD-10-CM | POA: Diagnosis not present

## 2023-03-10 DIAGNOSIS — H4603 Optic papillitis, bilateral: Secondary | ICD-10-CM | POA: Diagnosis not present

## 2023-03-10 DIAGNOSIS — R519 Headache, unspecified: Secondary | ICD-10-CM | POA: Diagnosis not present

## 2023-03-15 ENCOUNTER — Encounter: Payer: Self-pay | Admitting: Family Medicine

## 2023-03-15 ENCOUNTER — Ambulatory Visit (INDEPENDENT_AMBULATORY_CARE_PROVIDER_SITE_OTHER): Payer: BLUE CROSS/BLUE SHIELD | Admitting: Family Medicine

## 2023-03-15 VITALS — BP 141/87 | HR 80 | Ht 65.0 in | Wt 294.0 lb

## 2023-03-15 DIAGNOSIS — D509 Iron deficiency anemia, unspecified: Secondary | ICD-10-CM

## 2023-03-15 DIAGNOSIS — R7303 Prediabetes: Secondary | ICD-10-CM | POA: Diagnosis not present

## 2023-03-15 DIAGNOSIS — Z114 Encounter for screening for human immunodeficiency virus [HIV]: Secondary | ICD-10-CM

## 2023-03-15 DIAGNOSIS — R030 Elevated blood-pressure reading, without diagnosis of hypertension: Secondary | ICD-10-CM | POA: Diagnosis not present

## 2023-03-15 DIAGNOSIS — Z23 Encounter for immunization: Secondary | ICD-10-CM | POA: Diagnosis not present

## 2023-03-15 DIAGNOSIS — J302 Other seasonal allergic rhinitis: Secondary | ICD-10-CM | POA: Diagnosis not present

## 2023-03-15 DIAGNOSIS — Z118 Encounter for screening for other infectious and parasitic diseases: Secondary | ICD-10-CM

## 2023-03-15 DIAGNOSIS — Z1159 Encounter for screening for other viral diseases: Secondary | ICD-10-CM

## 2023-03-15 DIAGNOSIS — Z113 Encounter for screening for infections with a predominantly sexual mode of transmission: Secondary | ICD-10-CM | POA: Diagnosis not present

## 2023-03-15 MED ORDER — METFORMIN HCL ER 500 MG PO TB24
500.0000 mg | ORAL_TABLET | Freq: Every day | ORAL | 1 refills | Status: DC
Start: 2023-03-15 — End: 2023-06-16

## 2023-03-15 MED ORDER — VALACYCLOVIR HCL 1 G PO TABS: 1000.0000 mg | ORAL_TABLET | Freq: Every day | ORAL | 0 refills | Status: DC

## 2023-03-15 MED ORDER — FLUTICASONE PROPIONATE 50 MCG/ACT NA SUSP
2.0000 | Freq: Every day | NASAL | 6 refills | Status: AC
Start: 2023-03-15 — End: ?

## 2023-03-15 NOTE — Assessment & Plan Note (Signed)
Bp elevated, repeat on exam today  - mom has hx of htn  - we will continue to monitor - discussed that if she can lose 5-10lbs we can decrease her blood pressure without having to lose weight.

## 2023-03-15 NOTE — Progress Notes (Signed)
New  patient visit   Patient: Julie [name]   DOB: 11-28-2000   22 y.o. Female  MRN: [id number] Visit Date: 03/15/2023  Today's healthcare provider: Charlton Amor, DO   Chief Complaint  Patient presents with   New Patient (Initial Visit)    Establish care STI screening    SUBJECTIVE    Chief Complaint  Patient presents with   New Patient (Initial Visit)    Establish care STI screening   HPI HPI     New Patient (Initial Visit)    Additional comments: Establish care STI screening      Last edited by Roselyn Reef, CMA on 03/15/2023  8:20 AM.      Pt presents to establish care. She is followed by psych and was told at her last visit she was prediabetic and hd anemia. She would like to work these things up further   Also has concerns for hsv 2 and needs valtrex   Review of Systems  Constitutional:  Negative for activity change, fatigue and fever.  Respiratory:  Negative for cough and shortness of breath.   Cardiovascular:  Negative for chest pain.  Gastrointestinal:  Negative for abdominal pain.  Genitourinary:  Negative for difficulty urinating.       Current Meds  Medication Sig   acetaZOLAMIDE (DIAMOX) 250 MG tablet Take 1 tablet (250 mg total) by mouth 2 (two) times daily.   brexpiprazole (REXULTI) 1 MG TABS tablet Take 1 tablet (1 mg total) by mouth daily.   fluticasone (FLONASE) 50 MCG/ACT nasal spray Place 2 sprays into both nostrils daily.   metFORMIN (GLUCOPHAGE-XR) 500 MG 24 hr tablet Take 1 tablet (500 mg total) by mouth daily with breakfast.   valACYclovir (VALTREX) 1000 MG tablet Take 1 tablet (1,000 mg total) by mouth daily for 5 days.    OBJECTIVE    BP (!) 141/87   Pulse 80   Ht 5\' 5"  (1.651 m)   Wt 294 lb (133.4 kg)   SpO2 99%   BMI 48.92 kg/m   Physical Exam Vitals and nursing note reviewed.  Constitutional:      General: She is not in acute distress.    Appearance: Normal appearance.  HENT:     Head: Normocephalic and  atraumatic.     Right Ear: External ear normal.     Left Ear: External ear normal.     Nose: Nose normal.  Eyes:     Conjunctiva/sclera: Conjunctivae normal.  Cardiovascular:     Rate and Rhythm: Normal rate and regular rhythm.  Pulmonary:     Effort: Pulmonary effort is normal.     Breath sounds: Normal breath sounds.  Neurological:     General: No focal deficit present.     Mental Status: She is alert and oriented to person, place, and time.  Psychiatric:        Mood and Affect: Mood normal.        Behavior: Behavior normal.        Thought Content: Thought content normal.        Judgment: Judgment normal.        ASSESSMENT & PLAN    Problem List Items Addressed This Visit       Other   Elevated blood pressure reading    Bp elevated, repeat on exam today  - mom has hx of htn  - we will continue to monitor - discussed that if she can lose 5-10lbs we can decrease  her blood pressure without having to lose weight.       Seasonal allergies   Relevant Medications   fluticasone (FLONASE) 50 MCG/ACT nasal spray   Microcytic anemia    - will order iron panel. Pt has hx of heavy menses and I am wondering if this is the cause of her anemia      Relevant Orders   Fe+TIBC+Fer   Prediabetes    - discussed diet and exercise modifications - A1c 6.3 on previous blood work - have started pt on metformin 500 xr daily  - follow up 3 months for A1c check      Relevant Medications   metFORMIN (GLUCOPHAGE-XR) 500 MG 24 hr tablet   Screening examination for STI    - screening tests done for std testing. Pt is sexually active      Relevant Orders   Hepatitis C antibody   HIV antibody (with reflex)   RPR   Chlamydia/Gonococcus/Trichomonas, NAA   Ct, Ng, Mycoplasmas NAA, Urine   Other Visit Diagnoses     Screening for chlamydial disease    -  Primary   Relevant Orders   Chlamydia/Gonococcus/Trichomonas, NAA   Screening for HIV (human immunodeficiency virus)        Encounter for hepatitis C screening test for low risk patient       Encounter for immunization       Relevant Orders   Flu vaccine trivalent PF, 6mos and older(Flulaval,Afluria,Fluarix,Fluzone) (Completed)   Encounter for immunization       Relevant Orders   Pfizer Comirnaty Covid-19 Vaccine 73yrs & older (Completed)   Tdap vaccine greater than or equal to 7yo IM (Completed)       Return in about 3 months (around 06/14/2023) for prediabetes.      Meds ordered this encounter  Medications   fluticasone (FLONASE) 50 MCG/ACT nasal spray    Sig: Place 2 sprays into both nostrils daily.    Dispense:  16 g    Refill:  6   metFORMIN (GLUCOPHAGE-XR) 500 MG 24 hr tablet    Sig: Take 1 tablet (500 mg total) by mouth daily with breakfast.    Dispense:  90 tablet    Refill:  1   valACYclovir (VALTREX) 1000 MG tablet    Sig: Take 1 tablet (1,000 mg total) by mouth daily for 5 days.    Dispense:  20 tablet    Refill:  0    Orders Placed This Encounter  Procedures   Chlamydia/Gonococcus/Trichomonas, NAA   Flu vaccine trivalent PF, 6mos and older(Flulaval,Afluria,Fluarix,Fluzone)   Pfizer Comirnaty Covid-19 Vaccine 27yrs & older   Tdap vaccine greater than or equal to 7yo IM   Hepatitis C antibody   HIV antibody (with reflex)   RPR   Fe+TIBC+Fer   Ct, Ng, Mycoplasmas NAA, Urine     Charlton Amor, DO  [facility] at [facility] [phone] (phone) [phone] (fax)  [facility]

## 2023-03-15 NOTE — Assessment & Plan Note (Signed)
-   discussed diet and exercise modifications - A1c 6.3 on previous blood work - have started pt on metformin 500 xr daily  - follow up 3 months for A1c check

## 2023-03-15 NOTE — Assessment & Plan Note (Signed)
-   screening tests done for std testing. Pt is sexually active

## 2023-03-15 NOTE — Assessment & Plan Note (Signed)
-   will order iron panel. Pt has hx of heavy menses and I am wondering if this is the cause of her anemia

## 2023-03-16 ENCOUNTER — Other Ambulatory Visit: Payer: Self-pay | Admitting: Family Medicine

## 2023-03-16 DIAGNOSIS — B009 Herpesviral infection, unspecified: Secondary | ICD-10-CM

## 2023-03-16 MED ORDER — FERROUS SULFATE 325 (65 FE) MG PO TBEC
325.0000 mg | DELAYED_RELEASE_TABLET | Freq: Every day | ORAL | 3 refills | Status: DC
Start: 2023-03-16 — End: 2023-03-31

## 2023-03-16 MED ORDER — VALACYCLOVIR HCL 500 MG PO TABS
500.0000 mg | ORAL_TABLET | Freq: Every day | ORAL | 1 refills | Status: DC
Start: 2023-03-16 — End: 2023-04-06

## 2023-03-17 LAB — IRON,TIBC AND FERRITIN PANEL
Ferritin: 49 ng/mL (ref 15–150)
Iron Saturation: 9 % — CL (ref 15–55)
Iron: 25 ug/dL — ABNORMAL LOW (ref 27–159)
Total Iron Binding Capacity: 289 ug/dL (ref 250–450)
UIBC: 264 ug/dL (ref 131–425)

## 2023-03-17 LAB — HEPATITIS C ANTIBODY: Hep C Virus Ab: NONREACTIVE

## 2023-03-17 LAB — CT, NG, MYCOPLASMAS NAA, URINE
Chlamydia trachomatis, NAA: NEGATIVE
Mycoplasma genitalium NAA: NEGATIVE
Mycoplasma hominis NAA: NEGATIVE
Neisseria gonorrhoeae, NAA: NEGATIVE
Ureaplasma spp NAA: NEGATIVE

## 2023-03-17 LAB — RPR: RPR Ser Ql: NONREACTIVE

## 2023-03-17 LAB — HIV ANTIBODY (ROUTINE TESTING W REFLEX): HIV Screen 4th Generation wRfx: NONREACTIVE

## 2023-03-18 NOTE — Progress Notes (Signed)
You are negative for hepatitis C, negative for HIV, negative for syphilis, and negative for gonorrhea, chlamydia and mycoplasma.

## 2023-03-22 NOTE — Progress Notes (Unsigned)
NEUROLOGY FOLLOW UP OFFICE NOTE  TABBY BEASTON [id number]  Assessment/Plan:     1  Idiopathic intracranial hypertension    Restart acetazolamide at 250mg  twice daily (better tolerance) Get repeat eye exam in 8 weeks and will adjust medication accordingly Follow up with PCP regarding elevated blood pressure Follow up with me in 4 to 5 months.     Subjective:  Julie [name] is a 22 year old female who follows up for intracranial hypertension.  UPDATE: Last seen in October 2024.  Advised to restart acetazolamide 250mg  twice daily.  ***.  She had a repeat eye exam at [facility] last month which revealed marginal trace edema with normal visual field testing.  ***   HISTORY: She reports history of dull headaches around the eyes, right worse than left.    They are infrequent but they were daily back in [facility] 2023.  She also endorses visual obscurations particularly when she bends over or then gets back up.  Denies pulsatile tinnitus.  She reports that she gained about 10 lbs over the past year.  She is not on birth control medication.  She saw her optometrist on 10/29/2021 for routine exam.  Her last eye exam was at least 2 years prior.  Exam revealed bilateral disc edema with irregular margins, worse on right, consistent with papilledema.  She was sent to the ED for imaging where MRI of brain and orbits with and without contrast personally reviewed were normal.  She left before an LP could be performed.  Underwent lumbar puncture on 12/09/2021 which revealed an opening pressure of 46 cm water.  MRV of head with and without contrast on 02/03/2022 personally reviewed showed focal stenosis at the junction of the right transverse and sigmoid sinuses but no thrombosis.  Started on acetazolamide 500mg  twice daily.  Initially she decreased dose to once a day because it made her nauseous.   PAST MEDICAL HISTORY: Past Medical History:  Diagnosis Date   Anxiety    CMV (cytomegalovirus  infection) (HCC) 04/13/2018   Depression    Dysmenorrhea    Painful menstrual periods     MEDICATIONS: Current Outpatient Medications on File Prior to Visit  Medication Sig Dispense Refill   acetaZOLAMIDE (DIAMOX) 250 MG tablet Take 1 tablet (250 mg total) by mouth 2 (two) times daily. 60 tablet 5   brexpiprazole (REXULTI) 1 MG TABS tablet Take 1 tablet (1 mg total) by mouth daily. 30 tablet 2   escitalopram (LEXAPRO) 20 MG tablet Take 1 tablet (20 mg total) by mouth daily. 90 tablet 0   ferrous sulfate 325 (65 FE) MG EC tablet Take 1 tablet (325 mg total) by mouth daily with breakfast. 90 tablet 3   fluticasone (FLONASE) 50 MCG/ACT nasal spray Place 2 sprays into both nostrils daily. 16 g 6   hydrOXYzine (ATARAX) 25 MG tablet Take 1 tablet (25 mg total) by mouth 2 (two) times daily as needed for anxiety. (Patient not taking: Reported on 03/15/2023) 30 tablet 0   metFORMIN (GLUCOPHAGE-XR) 500 MG 24 hr tablet Take 1 tablet (500 mg total) by mouth daily with breakfast. 90 tablet 1   valACYclovir (VALTREX) 500 MG tablet Take 1 tablet (500 mg total) by mouth daily. 90 tablet 1   No current facility-administered medications on file prior to visit.     ALLERGIES: No Known Allergies  FAMILY HISTORY: Family History  Problem Relation Age of Onset   Hypertension Mother    Chronic Renal Failure Father  Depression Father    Alcohol abuse Maternal Uncle    Depression Paternal Uncle    Parkinsonism Maternal Grandmother    Dementia Maternal Grandmother    Stroke Paternal Grandmother    Dementia Paternal Grandmother    Depression Paternal Grandmother    Depression Paternal Grandfather       Objective:  *** General: No acute distress.  Patient appears well-groomed.   Head:  Normocephalic/atraumatic Eyes:  Fundi examined but not visualized Neck: supple, no paraspinal tenderness, full range of motion Heart:  Regular rate and rhythm Neurological Exam: alert and oriented.  Speech fluent  and not dysarthric, language intact.  CN II-XII intact. Bulk and tone normal, muscle strength 5/5 throughout.  Sensation to light touch intact.  Deep tendon reflexes 2+ throughout.  Finger to nose testing intact.  Gait normal, Romberg negative.   Shon Millet, DO

## 2023-03-23 ENCOUNTER — Encounter: Payer: Self-pay | Admitting: Neurology

## 2023-03-23 ENCOUNTER — Ambulatory Visit: Payer: BLUE CROSS/BLUE SHIELD | Admitting: Neurology

## 2023-03-23 VITALS — BP 137/84 | HR 99 | Ht 65.0 in | Wt 293.0 lb

## 2023-03-23 DIAGNOSIS — G932 Benign intracranial hypertension: Secondary | ICD-10-CM

## 2023-03-23 NOTE — Patient Instructions (Signed)
Continue acetazolamide 250mg  once daily Follow up with the eye doctor in 1 to 2 months.  Have note sent to me Follow up with me in 6 months.

## 2023-03-29 ENCOUNTER — Encounter: Payer: Self-pay | Admitting: Family Medicine

## 2023-03-31 ENCOUNTER — Other Ambulatory Visit: Payer: Self-pay | Admitting: Family Medicine

## 2023-03-31 MED ORDER — FERROUS SULFATE 325 (65 FE) MG PO TBEC: 325.0000 mg | DELAYED_RELEASE_TABLET | Freq: Every day | ORAL | 3 refills | Status: DC

## 2023-04-04 ENCOUNTER — Other Ambulatory Visit: Payer: Self-pay | Admitting: Family Medicine

## 2023-04-06 ENCOUNTER — Encounter: Payer: Self-pay | Admitting: Family Medicine

## 2023-04-06 DIAGNOSIS — B009 Herpesviral infection, unspecified: Secondary | ICD-10-CM

## 2023-04-06 MED ORDER — VALACYCLOVIR HCL 500 MG PO TABS: 500.0000 mg | ORAL_TABLET | Freq: Every day | ORAL | 1 refills | Status: DC

## 2023-04-27 ENCOUNTER — Other Ambulatory Visit (HOSPITAL_COMMUNITY): Payer: Self-pay | Admitting: Psychiatry

## 2023-04-27 DIAGNOSIS — F411 Generalized anxiety disorder: Secondary | ICD-10-CM

## 2023-05-05 DIAGNOSIS — F4310 Post-traumatic stress disorder, unspecified: Secondary | ICD-10-CM | POA: Diagnosis not present

## 2023-05-25 DIAGNOSIS — R519 Headache, unspecified: Secondary | ICD-10-CM | POA: Diagnosis not present

## 2023-05-25 DIAGNOSIS — H4603 Optic papillitis, bilateral: Secondary | ICD-10-CM | POA: Diagnosis not present

## 2023-05-30 ENCOUNTER — Other Ambulatory Visit (HOSPITAL_COMMUNITY): Payer: Self-pay

## 2023-05-30 DIAGNOSIS — F1210 Cannabis abuse, uncomplicated: Secondary | ICD-10-CM

## 2023-05-30 DIAGNOSIS — F331 Major depressive disorder, recurrent, moderate: Secondary | ICD-10-CM

## 2023-05-30 DIAGNOSIS — F419 Anxiety disorder, unspecified: Secondary | ICD-10-CM

## 2023-05-30 MED ORDER — ESCITALOPRAM OXALATE 20 MG PO TABS: 20.0000 mg | ORAL_TABLET | Freq: Every day | ORAL | 0 refills | Status: DC

## 2023-06-01 DIAGNOSIS — F4310 Post-traumatic stress disorder, unspecified: Secondary | ICD-10-CM | POA: Diagnosis not present

## 2023-06-07 ENCOUNTER — Encounter (HOSPITAL_COMMUNITY): Payer: Self-pay | Admitting: Psychiatry

## 2023-06-07 ENCOUNTER — Telehealth (HOSPITAL_COMMUNITY): Payer: BLUE CROSS/BLUE SHIELD | Admitting: Psychiatry

## 2023-06-07 VITALS — Wt 287.0 lb

## 2023-06-07 DIAGNOSIS — F411 Generalized anxiety disorder: Secondary | ICD-10-CM | POA: Diagnosis not present

## 2023-06-07 DIAGNOSIS — F419 Anxiety disorder, unspecified: Secondary | ICD-10-CM

## 2023-06-07 DIAGNOSIS — F331 Major depressive disorder, recurrent, moderate: Secondary | ICD-10-CM | POA: Diagnosis not present

## 2023-06-07 DIAGNOSIS — F1210 Cannabis abuse, uncomplicated: Secondary | ICD-10-CM | POA: Diagnosis not present

## 2023-06-07 MED ORDER — BREXPIPRAZOLE 1 MG PO TABS
1.0000 mg | ORAL_TABLET | Freq: Every day | ORAL | 2 refills | Status: DC
Start: 2023-06-07 — End: 2023-09-16

## 2023-06-07 MED ORDER — HYDROXYZINE HCL 25 MG PO TABS
25.0000 mg | ORAL_TABLET | Freq: Two times a day (BID) | ORAL | 1 refills | Status: DC | PRN
Start: 2023-06-07 — End: 2023-09-16

## 2023-06-07 MED ORDER — ESCITALOPRAM OXALATE 20 MG PO TABS: 20.0000 mg | ORAL_TABLET | Freq: Every day | ORAL | 2 refills | Status: DC

## 2023-06-07 NOTE — Progress Notes (Signed)
[facility] MD Virtual Progress Note   Patient Location: Home Provider Location: Home Office  I connect with patient by video and verified that I am speaking with correct person by using two identifiers. I discussed the limitations of evaluation and management by telemedicine and the availability of in person appointments. I also discussed with the patient that there may be a patient responsible charge related to this service. The patient expressed understanding and agreed to proceed.  Julie [name] [id number] 22 y.o.  06/07/2023 11:02 AM  History of Present Illness:  Patient is evaluated by video session.  She reported things are going well.  She is compliant with REXULTI, Lexapro and hydroxyzine which she takes as needed for sleep.  She reported sometimes vivid dreams but otherwise mood is stable.  Recently had a visit with neurology for intracranial hypertension.  She is taking acetazolamide is helping her headaches.  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  She wants to start school in January.  She wants to do bachelor and arts.  Patient told they may also help her housing in the future.  Currently she is staying with her mother.  She reported a better relationship with the mother and recently no major anger, agitation, mood swings.  She lost few pounds as watching her calorie intake and started walking regularly.  She is in therapy with Tanika.  She restarted relationship this Thanksgiving with the boyfriend who she knows him for past 10 years.  She has no tremors, shakes or any EPS.  She is still smoke marijuana but had cut down from the past she denies any panic attack or any feeling of hopelessness or worthlessness.  She denies any suicidal thoughts or paranoia.  She has no tremor or shakes or any EPS.  Past psychiatric History: H/O inpatient in October 2019 due to severe depression and plan to kill herself.  Did PHP and IOP. No h/o  mania, psychosis. Wellbutrin did not work and Abilify made tired and restless.  Hydroxyzine helped but discontinued after feeling better.    Outpatient Encounter Medications as of 06/07/2023  Medication Sig   acetaZOLAMIDE (DIAMOX) 250 MG tablet Take 1 tablet (250 mg total) by mouth 2 (two) times daily. (Patient taking differently: Take 250 mg by mouth daily.)   brexpiprazole (REXULTI) 1 MG TABS tablet Take 1 tablet (1 mg total) by mouth daily.   escitalopram (LEXAPRO) 20 MG tablet Take 1 tablet (20 mg total) by mouth daily. [place] to patient appt 12/17   ferrous sulfate 325 (65 FE) MG EC tablet Take 1 tablet (325 mg total) by mouth daily with breakfast.   fluticasone (FLONASE) 50 MCG/ACT nasal spray Place 2 sprays into both nostrils daily.   hydrOXYzine (ATARAX) 25 MG tablet Take 1 tablet (25 mg total) by mouth 2 (two) times daily as needed for anxiety. (Patient not taking: Reported on 03/23/2023)   metFORMIN (GLUCOPHAGE-XR) 500 MG 24 hr tablet Take 1 tablet (500 mg total) by mouth daily with breakfast.   valACYclovir (VALTREX) 500 MG tablet Take 1 tablet (500 mg total) by mouth daily.   No facility-administered encounter medications on file as of 06/07/2023.    Recent Results (from the past 2160 hours)  Hepatitis C antibody     Status: None   Collection Time: 03/15/23  9:21 AM  Result Value Ref Range   Hep C Virus Ab Non Reactive Non Reactive    Comment: HCV antibody alone does not  differentiate between previously resolved infection and active infection. Equivocal and Reactive HCV antibody results should be followed up with an HCV RNA test to support the diagnosis of active HCV infection.   HIV antibody (with reflex)     Status: None   Collection Time: 03/15/23  9:21 AM  Result Value Ref Range   HIV Screen 4th Generation wRfx Non Reactive Non Reactive    Comment: HIV-1/HIV-2 antibodies and HIV-1 p24 antigen were NOT detected. There is no laboratory evidence of HIV infection. HIV  Negative   RPR     Status: None   Collection Time: 03/15/23  9:21 AM  Result Value Ref Range   RPR Ser Ql Non Reactive Non Reactive  Fe+TIBC+Fer     Status: Abnormal   Collection Time: 03/15/23  9:21 AM  Result Value Ref Range   Total Iron Binding Capacity 289 250 - 450 ug/dL   UIBC 016 010 - 932 ug/dL   Iron 25 (L) 27 - 355 ug/dL   Iron Saturation 9 (LL) 15 - 55 %   Ferritin 49 15 - 150 ng/mL  Ct, Ng, Mycoplasmas NAA, Urine     Status: None   Collection Time: 03/15/23  9:21 AM  Result Value Ref Range   Mycoplasma genitalium NAA Negative Negative   Mycoplasma hominis NAA Negative Negative   Ureaplasma spp NAA Negative Negative   Chlamydia trachomatis, NAA Negative Negative   Neisseria gonorrhoeae, NAA Negative Negative     Psychiatric Specialty Exam: Physical Exam  Review of Systems  Weight 287 lb (130.2 kg).There is no height or weight on file to calculate BMI.  General Appearance: Casual  Eye Contact:  Fair  Speech:  Normal Rate  Volume:  Decreased  Mood:  Euthymic  Affect:  Congruent  Thought Process:  Goal Directed  Orientation:  Full (Time, Place, and Person)  Thought Content:  WDL  Suicidal Thoughts:  No  Homicidal Thoughts:  No  Memory:  Immediate;   Good Recent;   Good Remote;   Fair  Judgement:  Intact  Insight:  Present  Psychomotor Activity:  Normal  Concentration:  Concentration: Good and Attention Span: Good  Recall:  Good  Fund of Knowledge:  Good  Language:  Good  Akathisia:  No  Handed:  Right  AIMS (if indicated):     Assets:  Communication Skills Desire for Improvement Housing Social Support Talents/Skills  ADL's:  Intact  Cognition:  WNL  Sleep:  ok but sometimes dream     Assessment/Plan: MDD (major depressive disorder), recurrent episode, moderate (HCC) - Plan: escitalopram (LEXAPRO) 20 MG tablet, brexpiprazole (REXULTI) 1 MG TABS tablet  Anxiety - Plan: escitalopram (LEXAPRO) 20 MG tablet, brexpiprazole (REXULTI) 1 MG TABS  tablet  Mild tetrahydrocannabinol (THC) abuse - Plan: escitalopram (LEXAPRO) 20 MG tablet, brexpiprazole (REXULTI) 1 MG TABS tablet  GAD (generalized anxiety disorder) - Plan: hydrOXYzine (ATARAX) 25 MG tablet  Patient is stable on current medication.  She is taking all her medication regularly.  She is happy that vocational rehab will pay for her schooling and possible for her living expenses.  She is going to start the school in January.  I encouraged to continue therapy with Tanika McCoy.  Continue REXULTI 1 mg daily, Lexapro 20 mg daily and hydroxyzine 25 mg as needed for sleep or anxiety.  She has cut down her cannabis use from the past and her plan is to stop eventually.  Recommended to call us back if she has any question or  any concern.  Follow-up in 3 months.  Follow Up Instructions:     I discussed the assessment and treatment plan with the patient. The patient was provided an opportunity to ask questions and all were answered. The patient agreed with the plan and demonstrated an understanding of the instructions.   The patient was advised to call back or seek an in-person evaluation if the symptoms worsen or if the condition fails to improve as anticipated.    Collaboration of Care: Other provider involved in patient's care AEB notes are available in epic to review  Patient/Guardian was advised Release of Information must be obtained prior to any record release in order to collaborate their care with an outside provider. Patient/Guardian was advised if they have not already done so to contact the registration department to sign all necessary forms in order for [place] to release information regarding their care.   Consent: Patient/Guardian gives verbal consent for treatment and assignment of benefits for services provided during this visit. Patient/Guardian expressed understanding and agreed to proceed.     I provided 25 minutes of non face to face time during this encounter.  Note:  This document was prepared by [employer] voice dictation technology and any errors that results from this process are unintentional.    Cleotis Nipper, MD 06/07/2023

## 2023-06-16 ENCOUNTER — Ambulatory Visit: Payer: BLUE CROSS/BLUE SHIELD | Admitting: Family Medicine

## 2023-06-16 ENCOUNTER — Encounter: Payer: Self-pay | Admitting: Family Medicine

## 2023-06-16 VITALS — BP 125/83 | HR 81 | Ht 65.0 in | Wt 291.0 lb

## 2023-06-16 DIAGNOSIS — R7303 Prediabetes: Secondary | ICD-10-CM

## 2023-06-16 LAB — POCT GLYCOSYLATED HEMOGLOBIN (HGB A1C): Hemoglobin A1C: 5.9 % — AB (ref 4.0–5.6)

## 2023-06-16 MED ORDER — METFORMIN HCL ER 500 MG PO TB24
500.0000 mg | ORAL_TABLET | Freq: Every day | ORAL | 1 refills | Status: DC
Start: 2023-06-16 — End: 2023-09-19

## 2023-06-16 NOTE — Progress Notes (Signed)
Established patient visit   Patient: Julie [name]   DOB: [birth year]   22 y.o. Female  MRN: [id number] Visit Date: 06/16/2023  Today's healthcare provider: Charlton Amor, DO   Chief Complaint  Patient presents with   Medical Management of Chronic Issues    DM last A1C 6.3    SUBJECTIVE    Chief Complaint  Patient presents with   Medical Management of Chronic Issues    DM last A1C 6.3   HPI HPI     Medical Management of Chronic Issues    Additional comments: DM last A1C 6.3      Last edited by Roselyn Reef, CMA on 06/16/2023  9:23 AM.     Pt here for Prediabetes follow up - on metformin 500mg  xr and doing well  - no concerns   Review of Systems  Constitutional:  Negative for activity change, fatigue and fever.  Respiratory:  Negative for cough and shortness of breath.   Cardiovascular:  Negative for chest pain.  Gastrointestinal:  Negative for abdominal pain.  Genitourinary:  Negative for difficulty urinating.       Current Meds  Medication Sig   acetaZOLAMIDE (DIAMOX) 250 MG tablet Take 1 tablet (250 mg total) by mouth 2 (two) times daily. (Patient taking differently: Take 250 mg by mouth daily.)   brexpiprazole (REXULTI) 1 MG TABS tablet Take 1 tablet (1 mg total) by mouth daily.   escitalopram (LEXAPRO) 20 MG tablet Take 1 tablet (20 mg total) by mouth daily.   ferrous sulfate 325 (65 FE) MG EC tablet Take 1 tablet (325 mg total) by mouth daily with breakfast.   fluticasone (FLONASE) 50 MCG/ACT nasal spray Place 2 sprays into both nostrils daily.   hydrOXYzine (ATARAX) 25 MG tablet Take 1 tablet (25 mg total) by mouth 2 (two) times daily as needed for anxiety.   valACYclovir (VALTREX) 500 MG tablet Take 1 tablet (500 mg total) by mouth daily.   [DISCONTINUED] metFORMIN (GLUCOPHAGE-XR) 500 MG 24 hr tablet Take 1 tablet (500 mg total) by mouth daily with breakfast.    OBJECTIVE    BP 125/83 (BP Location: Left Arm, Patient Position: Sitting, Cuff  Size: Large)   Pulse 81   Ht 5\' 5"  (1.651 m)   Wt 291 lb (132 kg)   SpO2 98%   BMI 48.42 kg/m   Physical Exam Vitals reviewed.  Constitutional:      Appearance: She is well-developed.  HENT:     Head: Normocephalic and atraumatic.  Eyes:     Conjunctiva/sclera: Conjunctivae normal.  Cardiovascular:     Rate and Rhythm: Normal rate.  Pulmonary:     Effort: Pulmonary effort is normal.  Skin:    General: Skin is dry.     Coloration: Skin is not pale.  Neurological:     Mental Status: She is alert and oriented to person, place, and time.  Psychiatric:        Behavior: Behavior normal.        ASSESSMENT & PLAN    Problem List Items Addressed This Visit       Other   Prediabetes - Primary   Relevant Medications   metFORMIN (GLUCOPHAGE-XR) 500 MG 24 hr tablet   Other Relevant Orders   POCT HgB A1C (Completed)    Return in about 6 months (around 12/15/2023) for Prediabetes.      Meds ordered this encounter  Medications   metFORMIN (GLUCOPHAGE-XR) 500 MG 24 hr tablet  Sig: Take 1 tablet (500 mg total) by mouth daily with breakfast.    Dispense:  90 tablet    Refill:  1    Orders Placed This Encounter  Procedures   POCT HgB A1C     Charlton Amor, DO  [facility] at [facility] [phone] (phone) [phone] (fax)  [facility]

## 2023-06-30 DIAGNOSIS — F4310 Post-traumatic stress disorder, unspecified: Secondary | ICD-10-CM | POA: Diagnosis not present

## 2023-07-04 ENCOUNTER — Encounter: Payer: Self-pay | Admitting: Family Medicine

## 2023-07-04 ENCOUNTER — Ambulatory Visit: Payer: BC Managed Care – PPO | Admitting: Family Medicine

## 2023-07-04 ENCOUNTER — Other Ambulatory Visit (HOSPITAL_COMMUNITY): Payer: Self-pay | Admitting: Psychiatry

## 2023-07-04 VITALS — BP 137/82 | HR 102 | Ht 65.0 in | Wt 284.2 lb

## 2023-07-04 DIAGNOSIS — F419 Anxiety disorder, unspecified: Secondary | ICD-10-CM

## 2023-07-04 DIAGNOSIS — R030 Elevated blood-pressure reading, without diagnosis of hypertension: Secondary | ICD-10-CM | POA: Diagnosis not present

## 2023-07-04 DIAGNOSIS — Z7251 High risk heterosexual behavior: Secondary | ICD-10-CM | POA: Diagnosis not present

## 2023-07-04 DIAGNOSIS — F331 Major depressive disorder, recurrent, moderate: Secondary | ICD-10-CM

## 2023-07-04 DIAGNOSIS — F1210 Cannabis abuse, uncomplicated: Secondary | ICD-10-CM

## 2023-07-04 DIAGNOSIS — N926 Irregular menstruation, unspecified: Secondary | ICD-10-CM | POA: Insufficient documentation

## 2023-07-04 LAB — POCT URINE PREGNANCY: Preg Test, Ur: NEGATIVE

## 2023-07-04 MED ORDER — FERROUS SULFATE 325 (65 FE) MG PO TBEC: 325.0000 mg | DELAYED_RELEASE_TABLET | Freq: Every day | ORAL | 3 refills | Status: DC

## 2023-07-04 NOTE — Assessment & Plan Note (Signed)
 Blood pressure elevated, repeat is 137/82

## 2023-07-04 NOTE — Progress Notes (Signed)
 Established patient visit   Patient: Julie [name]   DOB: [birth year]   22 y.o. Female  MRN: [id number] Visit Date: 07/04/2023  Today's healthcare provider: Bernice GORMAN Juneau, DO   Chief Complaint  Patient presents with   Possible Pregnancy    Pt comes in today to have a pregnancy test. Pt states has not had a period in 6wks    SUBJECTIVE    Chief Complaint  Patient presents with   Possible Pregnancy    Pt comes in today to have a pregnancy test. Pt states has not had a period in 6wks   HPI HPI     Possible Pregnancy    Additional comments: Pt comes in today to have a pregnancy test. Pt states has not had a period in 6wks      Last edited by Duwaine Riggs, CMA on 07/04/2023 10:59 AM.      Pt presents for pregnancy testing. She started a new relationship and has been having unprotected intercourse. She is not on birth control because she said it made her feel weird and gave her mental health issues she is still working through. She has been using plan B post intercourse but says she is only doing that some of the time. She has not had her period for 6 weeks now.   Review of Systems  Constitutional:  Negative for activity change, fatigue and fever.  Respiratory:  Negative for cough and shortness of breath.   Cardiovascular:  Negative for chest pain.  Gastrointestinal:  Negative for abdominal pain.  Genitourinary:  Negative for difficulty urinating.       Current Meds  Medication Sig   brexpiprazole  (REXULTI ) 1 MG TABS tablet Take 1 tablet (1 mg total) by mouth daily.   escitalopram  (LEXAPRO ) 20 MG tablet Take 1 tablet (20 mg total) by mouth daily.   fluticasone  (FLONASE ) 50 MCG/ACT nasal spray Place 2 sprays into both nostrils daily.   hydrOXYzine  (ATARAX ) 25 MG tablet Take 1 tablet (25 mg total) by mouth 2 (two) times daily as needed for anxiety.   metFORMIN  (GLUCOPHAGE -XR) 500 MG 24 hr tablet Take 1 tablet (500 mg total) by mouth daily with breakfast.    valACYclovir  (VALTREX ) 500 MG tablet Take 1 tablet (500 mg total) by mouth daily.   [DISCONTINUED] ferrous sulfate  325 (65 FE) MG EC tablet Take 1 tablet (325 mg total) by mouth daily with breakfast.    OBJECTIVE    BP 137/82   Pulse (!) 102   Ht 5' 5 (1.651 m)   Wt 284 lb 4 oz (128.9 kg)   SpO2 99%   BMI 47.30 kg/m   Physical Exam Vitals reviewed.  Constitutional:      Appearance: She is well-developed.  HENT:     Head: Normocephalic and atraumatic.  Eyes:     Conjunctiva/sclera: Conjunctivae normal.  Cardiovascular:     Rate and Rhythm: Normal rate.  Pulmonary:     Effort: Pulmonary effort is normal.  Skin:    General: Skin is dry.     Coloration: Skin is not pale.  Neurological:     Mental Status: She is alert and oriented to person, place, and time.  Psychiatric:        Behavior: Behavior normal.        ASSESSMENT & PLAN    Problem List Items Addressed This Visit       Other   Elevated blood pressure reading   Blood pressure elevated, repeat  is 137/82      Missed periods - Primary   - pt says she has not had a period in 6 weeks and has been having unprotected intercourse. She is taking plan b after intercourse because she doesn't want to get pregnant and says she has taken about 8 pills in the last month. Counseled on dangers of unprotected sex and continued plan b use. Pt is not interested in birth control pill but would like nexplanon. Will have pt follow with NP, Joy for placement.  - will get blood work today - upt negative       Relevant Orders   POCT urine pregnancy (Completed)   Beta HCG, Quant   Unprotected sexual intercourse   Relevant Orders   Beta HCG, Quant    No follow-ups on file.      Meds ordered this encounter  Medications   ferrous sulfate  325 (65 FE) MG EC tablet    Sig: Take 1 tablet (325 mg total) by mouth daily with breakfast.    Dispense:  90 tablet    Refill:  3    Orders Placed This Encounter  Procedures   Beta  HCG, Quant   POCT urine pregnancy     Bernice GORMAN Juneau, DO  [facility] at [facility] [phone] (phone) [phone] (fax)  [facility]

## 2023-07-04 NOTE — Assessment & Plan Note (Signed)
-   pt says she has not had a period in 6 weeks and has been having unprotected intercourse. She is taking plan b after intercourse because she doesn't want to get pregnant and says she has taken about 8 pills in the last month. Counseled on dangers of unprotected sex and continued plan b use. Pt is not interested in birth control pill but would like nexplanon. Will have pt follow with NP, Joy for placement.  - will get blood work today - upt negative

## 2023-07-05 ENCOUNTER — Other Ambulatory Visit: Payer: Self-pay | Admitting: Family Medicine

## 2023-07-05 DIAGNOSIS — Z309 Encounter for contraceptive management, unspecified: Secondary | ICD-10-CM

## 2023-07-05 LAB — BETA HCG QUANT (REF LAB): hCG Quant: <1 m[IU]/mL

## 2023-07-28 DIAGNOSIS — F4310 Post-traumatic stress disorder, unspecified: Secondary | ICD-10-CM | POA: Diagnosis not present

## 2023-07-29 DIAGNOSIS — H4603 Optic papillitis, bilateral: Secondary | ICD-10-CM | POA: Diagnosis not present

## 2023-07-29 DIAGNOSIS — R519 Headache, unspecified: Secondary | ICD-10-CM | POA: Diagnosis not present

## 2023-08-02 ENCOUNTER — Telehealth: Payer: Self-pay | Admitting: Neurology

## 2023-08-02 NOTE — Telephone Encounter (Signed)
Julie [name] from [facility] called to let jaffe know Dr.Dunnington took pt off Diamox because of the nerve swelling. She is doing better now and she is not having HA

## 2023-08-09 ENCOUNTER — Ambulatory Visit: Payer: BLUE CROSS/BLUE SHIELD | Admitting: Neurology

## 2023-08-25 DIAGNOSIS — F4310 Post-traumatic stress disorder, unspecified: Secondary | ICD-10-CM | POA: Diagnosis not present

## 2023-09-07 ENCOUNTER — Telehealth (HOSPITAL_BASED_OUTPATIENT_CLINIC_OR_DEPARTMENT_OTHER): Payer: Self-pay | Admitting: Psychiatry

## 2023-09-07 ENCOUNTER — Encounter (HOSPITAL_COMMUNITY): Payer: Self-pay

## 2023-09-07 DIAGNOSIS — Z91199 Patient's noncompliance with other medical treatment and regimen due to unspecified reason: Secondary | ICD-10-CM

## 2023-09-07 NOTE — Progress Notes (Signed)
 Patient is no-show today.  She is not on video platform.  I called twice and left a message to call us back.

## 2023-09-16 ENCOUNTER — Encounter (HOSPITAL_COMMUNITY): Payer: Self-pay | Admitting: Psychiatry

## 2023-09-16 ENCOUNTER — Telehealth (HOSPITAL_COMMUNITY): Admitting: Psychiatry

## 2023-09-16 VITALS — Wt 284.0 lb

## 2023-09-16 DIAGNOSIS — F410 Panic disorder [episodic paroxysmal anxiety] without agoraphobia: Secondary | ICD-10-CM

## 2023-09-16 DIAGNOSIS — F331 Major depressive disorder, recurrent, moderate: Secondary | ICD-10-CM

## 2023-09-16 DIAGNOSIS — F411 Generalized anxiety disorder: Secondary | ICD-10-CM

## 2023-09-16 DIAGNOSIS — F1210 Cannabis abuse, uncomplicated: Secondary | ICD-10-CM | POA: Diagnosis not present

## 2023-09-16 MED ORDER — BREXPIPRAZOLE 1 MG PO TABS: 1.0000 mg | ORAL_TABLET | Freq: Every day | ORAL | 2 refills | Status: DC

## 2023-09-16 MED ORDER — HYDROXYZINE HCL 25 MG PO TABS
25.0000 mg | ORAL_TABLET | Freq: Every day | ORAL | 0 refills | Status: DC | PRN
Start: 2023-09-16 — End: 2023-12-19

## 2023-09-16 MED ORDER — ESCITALOPRAM OXALATE 20 MG PO TABS: 20.0000 mg | ORAL_TABLET | Freq: Every day | ORAL | 2 refills | Status: DC

## 2023-09-16 NOTE — Progress Notes (Signed)
 [facility] MD Virtual Progress Note   Patient Location: Home Provider Location: Home Office  I connect with patient by video and verified that I am speaking with correct person by using two identifiers. I discussed the limitations of evaluation and management by telemedicine and the availability of in person appointments. I also discussed with the patient that there may be a patient responsible charge related to this service. The patient expressed understanding and agreed to proceed.  Julie [name] [id number] 23 y.o.  09/16/2023 8:22 AM  History of Present Illness:  Patient is evaluated by video session.  She reported taking Rexulti and Lexapro but has not taken the hydroxyzine in a while.  She denies any major panic attack.  She started school at [employer] and her plan is to do bachelor in arts.  She denies any tremors, shakes or any EPS.  She is no longer taking acetazolamide as headaches are subsided.  She missed Rexulti for 2 days and she noticed sleeping too much but do not believe is worsening of symptoms.  No she is smoking delta 8 which is over-the-counter.  She also reported cut down and only spoke 2-3 times a week.  She denies any irritability, mania, psychosis, suicidal thoughts.  She recently bought a new car and excited about it.  Her relationship is going very well with the boyfriend.  She is staying with the mother who is very supportive.  Recently she had a blood work and her hemoglobin A1c 5.9.  She was started on metformin and she noticed weight loss but she did not have a scale and has not checked recently.  She wants to keep the Rexulti and Lexapro.  However if needed then she can take the hydroxyzine for panic attack.  She does not feel overwhelmed or anxious around people.  She is in therapy with Tanika McCoy.  Past Psychiatric History: H/O inpatient in October 2019 due to severe depression and plan to kill herself.  Did PHP and IOP. No h/o mania,  psychosis. Wellbutrin did not work and Abilify made tired and restless.  Hydroxyzine helped but discontinued after feeling better.    Outpatient Encounter Medications as of 09/16/2023  Medication Sig   acetaZOLAMIDE (DIAMOX) 250 MG tablet Take 1 tablet (250 mg total) by mouth 2 (two) times daily. (Patient not taking: Reported on 07/04/2023)   brexpiprazole (REXULTI) 1 MG TABS tablet Take 1 tablet (1 mg total) by mouth daily.   escitalopram (LEXAPRO) 20 MG tablet Take 1 tablet (20 mg total) by mouth daily.   ferrous sulfate 325 (65 FE) MG EC tablet Take 1 tablet (325 mg total) by mouth daily with breakfast.   fluticasone (FLONASE) 50 MCG/ACT nasal spray Place 2 sprays into both nostrils daily.   hydrOXYzine (ATARAX) 25 MG tablet Take 1 tablet (25 mg total) by mouth 2 (two) times daily as needed for anxiety.   metFORMIN (GLUCOPHAGE-XR) 500 MG 24 hr tablet Take 1 tablet (500 mg total) by mouth daily with breakfast.   valACYclovir (VALTREX) 500 MG tablet Take 1 tablet (500 mg total) by mouth daily.   No facility-administered encounter medications on file as of 09/16/2023.    Recent Results (from the past 2160 hours)  POCT urine pregnancy     Status: Normal   Collection Time: 07/04/23 10:59 AM  Result Value Ref Range   Preg Test, Ur Negative Negative  Beta HCG, Quant     Status: None   Collection Time: 07/04/23 11:12 AM  Result Value Ref Range   hCG Quant <1 mIU/mL    Comment:                      Female (Non-pregnant)    0 -     5                             (Postmenopausal)  0 -     8                      Female (Pregnant)                      Weeks of Gestation                              3                6 -    71                              4               10 -   750                              5              217 -  7138                              6              158 - 31795                              7             [id number] -[id number]                              8            32065  -[id number]                              0            10272 -536644                             10            [phone] -[phone]                             87            56433 -[phone]                             14            13950 - [phone]  15            12039 - 70971                             16             9040 - 56451                             17             8175 - 55868                             18             8099 - 58176 [employer] CLIA methodology      Psychiatric Specialty Exam: Physical Exam  Review of Systems  Weight 284 lb (128.8 kg).There is no height or weight on file to calculate BMI.  General Appearance: Casual and lying on bed  Eye Contact:  Good  Speech:  Normal Rate  Volume:  Normal  Mood:  Euthymic  Affect:  Congruent  Thought Process:  Goal Directed  Orientation:  Full (Time, Place, and Person)  Thought Content:  WDL  Suicidal Thoughts:  No  Homicidal Thoughts:  No  Memory:  Immediate;   Good Recent;   Good Remote;   Good  Judgement:  Good  Insight:  Good  Psychomotor Activity:  Normal  Concentration:  Concentration: Good and Attention Span: Good  Recall:  Good  Fund of Knowledge:  Good  Language:  Good  Akathisia:  No  Handed:  Right  AIMS (if indicated):     Assets:  Communication Skills Desire for Improvement Housing Social Support Transportation Vocational/Educational  ADL's:  Intact  Cognition:  WNL  Sleep:  sometimes dreams     Assessment/Plan: MDD (major depressive disorder), recurrent episode, moderate (HCC) - Plan: escitalopram (LEXAPRO) 20 MG tablet, brexpiprazole (REXULTI) 1 MG TABS tablet  GAD (generalized anxiety disorder) - Plan: hydrOXYzine (ATARAX) 25 MG tablet  Mild tetrahydrocannabinol (THC) abuse  Panic attacks - Plan: hydrOXYzine (ATARAX) 25 MG tablet  Reviewed blood work results.  Hemoglobin A1c 5.9.  Started metformin by primary care.  Encouraged to continue therapy mechanical required.   Continue Rexulti 1 mg daily and Lexapro 20 mg daily.  Will provide hydroxyzine 25 mg as needed for severe panic attack.  Discussed medication side effects and benefits.  She is smoking delta 8 over-the-counter but significantly cut down from the past.  Recommended to call us back if she is any question or any concern.  Follow-up in 3 months.   Follow Up Instructions:     I discussed the assessment and treatment plan with the patient. The patient was provided an opportunity to ask questions and all were answered. The patient agreed with the plan and demonstrated an understanding of the instructions.   The patient was advised to call back or seek an in-person evaluation if the symptoms worsen or if the condition fails to improve as anticipated.    Collaboration of Care: Other provider involved in patient's care AEB notes are available in epic to review  Patient/Guardian was advised Release of Information must be obtained prior to any record release in order to collaborate their care with an outside provider. Patient/Guardian was advised if they have not already done so to contact  the registration department to sign all necessary forms in order for [place] to release information regarding their care.   Consent: Patient/Guardian gives verbal consent for treatment and assignment of benefits for services provided during this visit. Patient/Guardian expressed understanding and agreed to proceed.     I provided 21 minutes of non face to face time during this encounter.  Note: This document was prepared by [employer] voice dictation technology and any errors that results from this process are unintentional.    Cleotis Nipper, MD 09/16/2023

## 2023-09-17 ENCOUNTER — Encounter: Payer: Self-pay | Admitting: Family Medicine

## 2023-09-17 DIAGNOSIS — R7303 Prediabetes: Secondary | ICD-10-CM

## 2023-09-19 MED ORDER — METFORMIN HCL ER 500 MG PO TB24: 500.0000 mg | ORAL_TABLET | Freq: Every day | ORAL | 1 refills | Status: DC

## 2023-09-19 NOTE — Telephone Encounter (Signed)
 Forwarding message to Dr. Linford Arnold covering Dr. Tamera Punt Requesting rx rf of  Metformin last written 06/16/2023 Last OV 07/04/2023 Upcoming appt 12/19/2023  (Ferrous sulfate should still have refills. Spoke with pharmacy and verified this Will be getting ready for patient pick up )

## 2023-09-20 NOTE — Progress Notes (Signed)
 NEUROLOGY FOLLOW UP OFFICE NOTE  Julie [name] [id number]  Assessment/Plan:      Idiopathic intracranial hypertension, stable    Off acetazolamide.  Continue to monitor. Follow up with [facility] as scheduled, sooner if needed Follow up with me in 6 months.      Subjective:  Julie [name] is a 23 year old female who follows up for intracranial hypertension.  Ophthalmology note reviewed.  UPDATE: Followed up with Dr. Hanley [name] at [facility] on 07/29/2023 which revealed complete resolution of optic nerve swelling.   Acetazolamide was discontinued.  She is feeling well.  No symptoms such as headache, visual disturbance or pulsatile tinnitus.     HISTORY: She reports history of dull headaches around the eyes, right worse than left.    They are infrequent but they were daily back in [facility] 2023.  She also endorses visual obscurations particularly when she bends over or then gets back up.  Denies pulsatile tinnitus.  She reports that she gained about 10 lbs over the past year.  She is not on birth control medication.  She saw her optometrist on 10/29/2021 for routine exam.  Her last eye exam was at least 2 years prior.  Exam revealed bilateral disc edema with irregular margins, worse on right, consistent with papilledema.  She was sent to the ED for imaging where MRI of brain and orbits with and without contrast personally reviewed were normal.  She left before an LP could be performed.  Underwent lumbar puncture on 12/09/2021 which revealed an opening pressure of 46 cm water.  MRV of head with and without contrast on 02/03/2022 personally reviewed showed focal stenosis at the junction of the right transverse and sigmoid sinuses but no thrombosis.  Started on acetazolamide 500mg  twice daily.  Initially she decreased dose to once a day because it made her nauseous.   PAST MEDICAL HISTORY: Past Medical History:  Diagnosis Date   Anxiety    CMV (cytomegalovirus infection)  (HCC) 04/13/2018   Depression    Dysmenorrhea    Painful menstrual periods     MEDICATIONS: Current Outpatient Medications on File Prior to Visit  Medication Sig Dispense Refill   acetaZOLAMIDE (DIAMOX) 250 MG tablet Take 1 tablet (250 mg total) by mouth 2 (two) times daily. (Patient not taking: Reported on 07/04/2023) 60 tablet 5   brexpiprazole (REXULTI) 1 MG TABS tablet Take 1 tablet (1 mg total) by mouth daily. 30 tablet 2   escitalopram (LEXAPRO) 20 MG tablet Take 1 tablet (20 mg total) by mouth daily. 30 tablet 2   ferrous sulfate 325 (65 FE) MG EC tablet Take 1 tablet (325 mg total) by mouth daily with breakfast. 90 tablet 3   fluticasone (FLONASE) 50 MCG/ACT nasal spray Place 2 sprays into both nostrils daily. 16 g 6   hydrOXYzine (ATARAX) 25 MG tablet Take 1 tablet (25 mg total) by mouth daily as needed for anxiety. 30 tablet 0   metFORMIN (GLUCOPHAGE-XR) 500 MG 24 hr tablet Take 1 tablet (500 mg total) by mouth daily with breakfast. 90 tablet 1   valACYclovir (VALTREX) 500 MG tablet Take 1 tablet (500 mg total) by mouth daily. 90 tablet 1   No current facility-administered medications on file prior to visit.     ALLERGIES: No Known Allergies  FAMILY HISTORY: Family History  Problem Relation Age of Onset   Hypertension Mother    Chronic Renal Failure Father    Depression Father    Alcohol abuse  Maternal Uncle    Depression Paternal Uncle    Parkinsonism Maternal Grandmother    Dementia Maternal Grandmother    Stroke Paternal Grandmother    Dementia Paternal Grandmother    Depression Paternal Grandmother    Depression Paternal Grandfather       Objective:  Blood pressure 128/80, pulse 76, height 5\' 5"  (1.651 m), weight 284 lb (128.8 kg), SpO2 98%. General: No acute distress.  Patient appears well-groomed.   Head:  Normocephalic/atraumatic Neck:  Supple.  No paraspinal tenderness.  Full range of motion. Heart:  Regular rate and rhythm. Neuro:  Alert and oriented.   Speech fluent and not dysarthric.  Language intact.  CN II-XII intact.  Bulk and tone normal.  Muscle strength 5/5 throughout.  Deep tendon reflexes 2+ throughout.  Gait normal.  Romberg negative.    Julie Millet, DO  CC:  Julie Hummingbird, DO

## 2023-09-21 ENCOUNTER — Encounter: Payer: Self-pay | Admitting: Neurology

## 2023-09-21 ENCOUNTER — Ambulatory Visit (INDEPENDENT_AMBULATORY_CARE_PROVIDER_SITE_OTHER): Payer: BLUE CROSS/BLUE SHIELD | Admitting: Neurology

## 2023-09-21 VITALS — BP 128/80 | HR 76 | Ht 65.0 in | Wt 284.0 lb

## 2023-09-21 DIAGNOSIS — G932 Benign intracranial hypertension: Secondary | ICD-10-CM | POA: Diagnosis not present

## 2023-10-06 ENCOUNTER — Other Ambulatory Visit: Payer: Self-pay | Admitting: Family Medicine

## 2023-10-06 DIAGNOSIS — B009 Herpesviral infection, unspecified: Secondary | ICD-10-CM

## 2023-10-07 ENCOUNTER — Other Ambulatory Visit: Payer: Self-pay | Admitting: Family Medicine

## 2023-10-07 DIAGNOSIS — B009 Herpesviral infection, unspecified: Secondary | ICD-10-CM

## 2023-10-10 MED ORDER — VALACYCLOVIR HCL 500 MG PO TABS: 500.0000 mg | ORAL_TABLET | Freq: Every day | ORAL | 0 refills | Status: DC

## 2023-10-20 DIAGNOSIS — F4310 Post-traumatic stress disorder, unspecified: Secondary | ICD-10-CM | POA: Diagnosis not present

## 2023-10-27 ENCOUNTER — Encounter: Payer: Self-pay | Admitting: Family Medicine

## 2023-11-10 ENCOUNTER — Other Ambulatory Visit: Payer: Self-pay | Admitting: Physician Assistant

## 2023-11-10 DIAGNOSIS — B009 Herpesviral infection, unspecified: Secondary | ICD-10-CM

## 2023-11-13 ENCOUNTER — Encounter: Payer: Self-pay | Admitting: Family Medicine

## 2023-11-28 DIAGNOSIS — N898 Other specified noninflammatory disorders of vagina: Secondary | ICD-10-CM | POA: Diagnosis not present

## 2023-11-28 DIAGNOSIS — R300 Dysuria: Secondary | ICD-10-CM | POA: Diagnosis not present

## 2023-11-28 DIAGNOSIS — R3589 Other polyuria: Secondary | ICD-10-CM | POA: Diagnosis not present

## 2023-11-30 ENCOUNTER — Encounter (HOSPITAL_COMMUNITY): Payer: Self-pay | Admitting: Psychiatry

## 2023-11-30 ENCOUNTER — Telehealth (HOSPITAL_BASED_OUTPATIENT_CLINIC_OR_DEPARTMENT_OTHER): Admitting: Psychiatry

## 2023-11-30 VITALS — Wt 292.0 lb

## 2023-11-30 DIAGNOSIS — F1210 Cannabis abuse, uncomplicated: Secondary | ICD-10-CM | POA: Diagnosis not present

## 2023-11-30 DIAGNOSIS — F411 Generalized anxiety disorder: Secondary | ICD-10-CM

## 2023-11-30 DIAGNOSIS — F410 Panic disorder [episodic paroxysmal anxiety] without agoraphobia: Secondary | ICD-10-CM

## 2023-11-30 DIAGNOSIS — F331 Major depressive disorder, recurrent, moderate: Secondary | ICD-10-CM | POA: Diagnosis not present

## 2023-11-30 MED ORDER — ESCITALOPRAM OXALATE 20 MG PO TABS: 20.0000 mg | ORAL_TABLET | Freq: Every day | ORAL | 2 refills | Status: DC

## 2023-11-30 MED ORDER — BREXPIPRAZOLE 1 MG PO TABS
1.0000 mg | ORAL_TABLET | Freq: Every day | ORAL | 2 refills | Status: DC
Start: 2023-11-30 — End: 2024-02-29

## 2023-11-30 NOTE — Progress Notes (Signed)
 [facility] MD Virtual Progress Note   Patient Location: Home Provider Location: Home Office  I connect with patient by video and verified that I am speaking with correct person by using two identifiers. I discussed the limitations of evaluation and management by telemedicine and the availability of in person appointments. I also discussed with the patient that there may be a patient responsible charge related to this service. The patient expressed understanding and agreed to proceed.  Julie [name] [id number] 22 y.o.  11/30/2023 11:44 AM  History of Present Illness:  Patient is evaluated by video session.  She is doing much better overall.  She is taking Rexulti  and Lexapro .  She has not taken hydroxyzine  in more than 3 months.  She finished the semester at [employer] and going to start the new semester in August.  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  Her headaches are not as bad.  Recently she had a visit with the neurology but no new medication added.  She finished the treatment for intracranial pressure and no longer taking acetazolamide .  She like to continue Rexulti  and Lexapro  which is keeping her mood stable.  She continues to wape [facility].  She denies any suicidal thoughts or homicidal thoughts.  Her anxiety is under control and denies any major panic attack.  She stays with her mother who is very supportive.  She is in therapy with Tanika.  Her relationship is going okay.  Past Psychiatric History: H/O inpatient in October 2019 due to severe depression and plan to kill herself.  Did PHP and IOP. No h/o mania, psychosis. Wellbutrin  did not work and Abilify  made tired and restless.  Hydroxyzine  helped but discontinued after feeling better.    Outpatient Encounter Medications as of 11/30/2023  Medication Sig   brexpiprazole  (REXULTI ) 1 MG TABS tablet Take 1 tablet (1 mg total) by mouth daily.   escitalopram   (LEXAPRO ) 20 MG tablet Take 1 tablet (20 mg total) by mouth daily.   fluticasone  (FLONASE ) 50 MCG/ACT nasal spray Place 2 sprays into both nostrils daily.   hydrOXYzine  (ATARAX ) 25 MG tablet Take 1 tablet (25 mg total) by mouth daily as needed for anxiety.   metFORMIN  (GLUCOPHAGE -XR) 500 MG 24 hr tablet Take 1 tablet (500 mg total) by mouth daily with breakfast.   valACYclovir  (VALTREX ) 500 MG tablet TAKE 1 TABLET(500 MG) BY MOUTH DAILY   No facility-administered encounter medications on file as of 11/30/2023.    No results found for this or any previous visit (from the past 2160 hours).   Psychiatric Specialty Exam: Physical Exam  Review of Systems  Weight 292 lb (132.5 kg).There is no height or weight on file to calculate BMI.  General Appearance: Casual  Eye Contact:  Good  Speech:  Clear and Coherent  Volume:  Normal  Mood:  Euthymic  Affect:  Congruent  Thought Process:  Goal Directed  Orientation:  Full (Time, Place, and Person)  Thought Content:  WDL  Suicidal Thoughts:  No  Homicidal Thoughts:  No  Memory:  Immediate;   Good Recent;   Good Remote;   Good  Judgement:  Good  Insight:  Present  Psychomotor Activity:  Normal  Concentration:  Concentration: Good and Attention Span: Good  Recall:  Good  Fund of Knowledge:  Good  Language:  Good  Akathisia:  No  Handed:  Right  AIMS (if indicated):     Assets:  Communication  Skills Desire for Improvement Housing Resilience Social Support Talents/Skills  ADL's:  Intact  Cognition:  WNL  Sleep:  ok       11/30/2023   12:16 PM 12/10/2021    8:51 AM 05/03/2018    9:00 AM 04/18/2018   12:51 PM 04/18/2018    9:00 AM  Depression screen PHQ 2/9  Decreased Interest 0 1 1 2 3   Down, Depressed, Hopeless 0 1 2 2 3   PHQ - 2 Score 0 2 3 4 6   Altered sleeping  0 0 1 2  Tired, decreased energy  1 1 2 3   Change in appetite  0 3 2 1   Feeling bad or failure about yourself   2 2 2 3   Trouble concentrating  1 1 1 2   Moving  slowly or fidgety/restless  1 0 1 0  Suicidal thoughts  1 2 2  0  PHQ-9 Score  8 12 15 17   Difficult doing work/chores  Very difficult       Assessment/Plan: MDD (major depressive disorder), recurrent episode, moderate (HCC) - Plan: brexpiprazole  (REXULTI ) 1 MG TABS tablet, escitalopram  (LEXAPRO ) 20 MG tablet  GAD (generalized anxiety disorder) - Plan: brexpiprazole  (REXULTI ) 1 MG TABS tablet, escitalopram  (LEXAPRO ) 20 MG tablet  Mild tetrahydrocannabinol (THC) abuse - Plan: brexpiprazole  (REXULTI ) 1 MG TABS tablet, escitalopram  (LEXAPRO ) 20 MG tablet  Panic attacks - Plan: brexpiprazole  (REXULTI ) 1 MG TABS tablet, escitalopram  (LEXAPRO ) 20 MG tablet  Patient doing better on current medication.  She is no longer taking hydroxyzine  and does not have any major panic attack.  She is able to go to places and does not feel overwhelmed.  Her plan is to gradually cut down therapy since she is able to cope better.  She like to continue current medication.  She has no tremor or shakes or any EPS.  Continue Rexulti  1 mg daily and Lexapro  20 mg daily.  Recommend to call us  back if she has any question or any concern.  Follow-up in 3 months.   Follow Up Instructions:     I discussed the assessment and treatment plan with the patient. The patient was provided an opportunity to ask questions and all were answered. The patient agreed with the plan and demonstrated an understanding of the instructions.   The patient was advised to call back or seek an in-person evaluation if the symptoms worsen or if the condition fails to improve as anticipated.    Collaboration of Care: Other provider involved in patient's care AEB notes are available in epic to review  Patient/Guardian was advised Release of Information must be obtained prior to any record release in order to collaborate their care with an outside provider. Patient/Guardian was advised if they have not already done so to contact the registration  department to sign all necessary forms in order for us  to release information regarding their care.   Consent: Patient/Guardian gives verbal consent for treatment and assignment of benefits for services provided during this visit. Patient/Guardian expressed understanding and agreed to proceed.     Total encounter time 26 minutes which includes face-to-face time, chart reviewed, care coordination, order entry and documentation during this encounter.   Note: This document was prepared by [employer] voice dictation technology and any errors that results from this process are unintentional.    Arturo Late, MD 11/30/2023

## 2023-12-15 ENCOUNTER — Other Ambulatory Visit: Payer: Self-pay | Admitting: Physician Assistant

## 2023-12-15 DIAGNOSIS — B009 Herpesviral infection, unspecified: Secondary | ICD-10-CM

## 2023-12-19 ENCOUNTER — Ambulatory Visit: Payer: BLUE CROSS/BLUE SHIELD | Admitting: Urgent Care

## 2023-12-19 ENCOUNTER — Encounter: Payer: Self-pay | Admitting: Urgent Care

## 2023-12-19 VITALS — BP 132/84 | HR 83 | Resp 19 | Ht 65.0 in | Wt 295.0 lb

## 2023-12-19 DIAGNOSIS — R030 Elevated blood-pressure reading, without diagnosis of hypertension: Secondary | ICD-10-CM | POA: Diagnosis not present

## 2023-12-19 DIAGNOSIS — R7303 Prediabetes: Secondary | ICD-10-CM | POA: Diagnosis not present

## 2023-12-19 DIAGNOSIS — Z309 Encounter for contraceptive management, unspecified: Secondary | ICD-10-CM | POA: Diagnosis not present

## 2023-12-19 DIAGNOSIS — R635 Abnormal weight gain: Secondary | ICD-10-CM | POA: Diagnosis not present

## 2023-12-19 DIAGNOSIS — B009 Herpesviral infection, unspecified: Secondary | ICD-10-CM

## 2023-12-19 DIAGNOSIS — R0681 Apnea, not elsewhere classified: Secondary | ICD-10-CM

## 2023-12-19 DIAGNOSIS — R0683 Snoring: Secondary | ICD-10-CM

## 2023-12-19 DIAGNOSIS — D509 Iron deficiency anemia, unspecified: Secondary | ICD-10-CM

## 2023-12-19 MED ORDER — VALACYCLOVIR HCL 500 MG PO TABS: 500.0000 mg | ORAL_TABLET | Freq: Every day | ORAL | 3 refills | Status: AC

## 2023-12-19 NOTE — Progress Notes (Unsigned)
 Established Patient Office Visit  Subjective:  Patient ID: Julie [name], female    DOB: 06-25-2000  Age: 23 y.o. MRN: [id number]  Chief Complaint  Patient presents with   Transitions Of Care    Est. Care pt would also like to schedule a PAP and IUD placement with you. Also be screened for sleep Apnea    HPI  Discussed the use of AI scribe software for clinical note transcription with the patient, who gave verbal consent to proceed.  History of Present Illness   Julie [name] is a 23 year old female who presents for A1c monitoring and medication review.  Six months ago, her A1c was 5.9, indicating prediabetes. She was started on metformin  500 mg once daily in December or January. She does not monitor her blood sugar at home and experiences no side effects from the medication.  She is currently taking Rexulti , Lexapro  20 mg, hydroxyzine  as needed, Flonase  nasal spray, Valtrex  500 mg once daily for suppression, cetirizine, and ferrous sulfate  for anemia. She has been taking ferrous sulfate  daily for nine months, which causes some upset stomach but no constipation. She takes it first thing in the morning without food.  She has a history of anemia with low iron levels noted in September, though her ferritin was normal. She previously experienced heavy menstrual periods, which have since resolved. She is not on birth control but is interested in a non-hormonal IUD for pregnancy prevention. She has never had an IUD and has not given birth.  She has a history of high blood pressure, which was slightly elevated today. She is not on medication for it and does not check her blood pressure at home. Her blood pressure was normal during a visit in April.  She has concerns about sleep apnea, as she sometimes wakes up with a headache and has been told she snores. A sleep study at age 45 or 26 showed no issues. She sleeps propped up with six pillows.       Patient Active Problem List   Diagnosis  Date Noted   Missed periods 07/04/2023   Unprotected sexual intercourse 07/04/2023   Elevated blood pressure reading 03/15/2023   Seasonal allergies 03/15/2023   Microcytic anemia 03/15/2023   Prediabetes 03/15/2023   Screening examination for STI 03/15/2023   MDD (major depressive disorder), recurrent episode, severe (HCC) 04/11/2018   Past Medical History:  Diagnosis Date   Anxiety    CMV (cytomegalovirus infection) (HCC) 04/13/2018   Depression    Dysmenorrhea    Painful menstrual periods    Past Surgical History:  Procedure Laterality Date   TONSILLECTOMY     Social History   Tobacco Use   Smoking status: Never   Smokeless tobacco: Never  Vaping Use   Vaping status: Never Used  Substance Use Topics   Alcohol use: Never   Drug use: Yes    Types: Marijuana    Comment: Has done this twice and last time 1 week prior      ROS: as noted in HPI  Objective:     BP 132/84 (BP Location: Left Arm, Patient Position: Sitting, Cuff Size: Large)   Pulse 83   Resp 19   Ht 5' 5 (1.651 m)   Wt 295 lb (133.8 kg)   SpO2 99%   BMI 49.09 kg/m  BP Readings from Last 3 Encounters:  12/19/23 132/84  09/26/23 128/80  07/04/23 137/82   Wt Readings from Last 3 Encounters:  12/19/23 295  lb (133.8 kg)  09/26/23 284 lb (128.8 kg)  07/04/23 284 lb 4 oz (128.9 kg)      Physical Exam Vitals and nursing note reviewed.  Constitutional:      General: She is not in acute distress.    Appearance: Normal appearance. She is not ill-appearing, toxic-appearing or diaphoretic.  HENT:     Head: Normocephalic and atraumatic.     Right Ear: Tympanic membrane, ear canal and external ear normal. There is no impacted cerumen.     Left Ear: Tympanic membrane, ear canal and external ear normal. There is no impacted cerumen.     Nose: Nose normal.     Mouth/Throat:     Mouth: Mucous membranes are moist.     Pharynx: Oropharynx is clear. No oropharyngeal exudate or posterior oropharyngeal  erythema.     Comments: Mallampati score 4  Eyes:     General: No scleral icterus.       Right eye: No discharge.        Left eye: No discharge.     Extraocular Movements: Extraocular movements intact.     Pupils: Pupils are equal, round, and reactive to light.   Neck:     Thyroid : No thyroid  mass, thyromegaly or thyroid  tenderness.   Cardiovascular:     Rate and Rhythm: Normal rate and regular rhythm.     Pulses: Normal pulses.     Heart sounds: No murmur heard. Pulmonary:     Effort: Pulmonary effort is normal. No respiratory distress.     Breath sounds: Normal breath sounds. No stridor. No wheezing or rhonchi.  Abdominal:     General: Bowel sounds are normal.   Musculoskeletal:     Cervical back: Normal range of motion and neck supple. No rigidity or tenderness.     Right lower leg: No edema.     Left lower leg: No edema.  Lymphadenopathy:     Cervical: No cervical adenopathy.   Skin:    General: Skin is warm and dry.     Coloration: Skin is not jaundiced.     Findings: No bruising, erythema or rash.   Neurological:     General: No focal deficit present.     Mental Status: She is alert and oriented to person, place, and time.   Psychiatric:        Mood and Affect: Mood normal.        Behavior: Behavior normal.      No results found for any visits on 12/19/23.  Last CBC Lab Results  Component Value Date   WBC 8.3 03/02/2023   HGB 10.0 (L) 03/02/2023   HCT 31.7 (L) 03/02/2023   MCV 77 (L) 03/02/2023   MCH 24.3 (L) 03/02/2023   RDW 14.2 03/02/2023   PLT 365 03/02/2023   Last metabolic panel Lab Results  Component Value Date   GLUCOSE 92 03/02/2023   NA 141 03/02/2023   K 4.3 03/02/2023   CL 105 03/02/2023   CO2 23 03/02/2023   BUN 6 03/02/2023   CREATININE 0.70 03/02/2023   EGFR 124 03/02/2023   CALCIUM 8.7 03/02/2023   PROT 6.7 03/02/2023   ALBUMIN 3.6 (L) 03/02/2023   LABGLOB 3.1 03/02/2023   BILITOT 0.2 03/02/2023   ALKPHOS 64 03/02/2023    AST 12 03/02/2023   ALT 12 03/02/2023   ANIONGAP 9 10/29/2021   Last lipids Lab Results  Component Value Date   CHOL 177 04/11/2018   HDL 40 (L) 04/11/2018  LDLCALC 116 (H) 04/11/2018   TRIG 107 04/11/2018   CHOLHDL 4.4 04/11/2018   Last hemoglobin A1c Lab Results  Component Value Date   HGBA1C 5.9 (A) 06/16/2023   Last thyroid  functions Lab Results  Component Value Date   TSH 2.682 04/11/2018   Last vitamin D No results found for: 25OHVITD2, 25OHVITD3, VD25OH Last vitamin B12 and Folate No results found for: VITAMINB12, FOLATE    The ASCVD Risk score (Arnett DK, et al., 2019) failed to calculate for the following reasons:   The 2019 ASCVD risk score is only valid for ages 32 to 48  Assessment & Plan:  Prediabetes -     CMP14+EGFR -     Hemoglobin A1c -     TSH  HSV (herpes simplex virus) infection -     valACYclovir  HCl; Take 1 tablet (500 mg total) by mouth daily.  Dispense: 90 tablet; Refill: 3  Elevated blood pressure reading -     CMP14+EGFR -     CBC with Differential/Platelet -     TSH  Encounter for contraceptive management, unspecified type -     Ambulatory referral to Obstetrics / Gynecology  Microcytic anemia -     CMP14+EGFR -     CBC with Differential/Platelet -     Iron, TIBC and Ferritin Panel  Weight gain -     Hemoglobin A1c -     TSH  Snoring -     Ambulatory referral to Sleep Studies  Witnessed episode of apnea -     Ambulatory referral to Sleep Studies  Assessment and Plan    Prediabetes A1c was 5.9% six months ago. On metformin  500 mg daily with no side effects. - Order A1c test. - Continue metformin  500 mg once daily. - Adjust treatment if A1c exceeds 6.5%.  Hypertension Blood pressure slightly elevated today, normal in April. No home monitoring or antihypertensive medication. - Recheck blood pressure with correct cuff size before departure - now normal  Iron Deficiency Anemia Taking ferrous sulfate  325  mg daily for nine months. Reports upset stomach. Iron levels low in September, ferritin normal. - Order iron level test. - Advise taking iron supplement with food to reduce stomach upset.  Sleep Apnea Symptoms suggestive of sleep apnea. Previous sleep test normal. High Mallampati score. Discussed home sleep test option. - Refer for home sleep apnea test through [facility].  Contraception Counseling Interested in non-hormonal ParaGard IUD. Discussed differences between hormonal and non-hormonal IUDs. - Refer to gynecology for ParaGard IUD placement.  General Health Maintenance On multiple medications. Not on a multivitamin. Following with specialist for Rexulti  and Lexapro . - Ensure all medications are refilled at [facility] in [facility]. - Advise starting a multivitamin.  Follow-up Will be contacted via MyChart for lab results and recommendations. Regular follow-up every four months for A1c monitoring. - Contact via MyChart with lab results and recommendations. - Schedule follow-up every four months to monitor A1c.         Return in about 4 months (around 04/19/2024).   Benton LITTIE Gave, PA

## 2023-12-19 NOTE — Patient Instructions (Addendum)
 Please continue your metformin  and iron as prescribed. We drew your labs today, will contact you via mychart with recommendations.  Please go to gynecology to discuss paragard IUD placement.  I have ordered a home sleep test through [facility]. Please complete the study as discussed.  Please schedule a four month follow up.

## 2023-12-20 ENCOUNTER — Ambulatory Visit: Payer: Self-pay | Admitting: Urgent Care

## 2023-12-20 LAB — CBC WITH DIFFERENTIAL/PLATELET
Basophils Absolute: 0 10*3/uL (ref 0.0–0.2)
Basos: 1 %
EOS (ABSOLUTE): 0.3 10*3/uL (ref 0.0–0.4)
Eos: 5 %
Hematocrit: 34.6 % (ref 34.0–46.6)
Hemoglobin: 10.4 g/dL — ABNORMAL LOW (ref 11.1–15.9)
Immature Grans (Abs): 0 10*3/uL (ref 0.0–0.1)
Immature Granulocytes: 0 %
Lymphocytes Absolute: 2.5 10*3/uL (ref 0.7–3.1)
Lymphs: 49 %
MCH: 24.7 pg — ABNORMAL LOW (ref 26.6–33.0)
MCHC: 30.1 g/dL — ABNORMAL LOW (ref 31.5–35.7)
MCV: 82 fL (ref 79–97)
Monocytes Absolute: 0.4 10*3/uL (ref 0.1–0.9)
Monocytes: 7 %
Neutrophils Absolute: 2 10*3/uL (ref 1.4–7.0)
Neutrophils: 38 %
Platelets: 355 10*3/uL (ref 150–450)
RBC: 4.21 x10E6/uL (ref 3.77–5.28)
RDW: 13.3 % (ref 11.7–15.4)
WBC: 5.2 10*3/uL (ref 3.4–10.8)

## 2023-12-20 LAB — IRON,TIBC AND FERRITIN PANEL
Ferritin: 34 ng/mL (ref 15–150)
Iron Saturation: 9 % — CL (ref 15–55)
Iron: 28 ug/dL (ref 27–159)
Total Iron Binding Capacity: 296 ug/dL (ref 250–450)
UIBC: 268 ug/dL (ref 131–425)

## 2023-12-20 LAB — CMP14+EGFR
ALT: 12 IU/L (ref 0–32)
AST: 15 IU/L (ref 0–40)
Albumin: 3.8 g/dL — ABNORMAL LOW (ref 4.0–5.0)
Alkaline Phosphatase: 49 IU/L (ref 44–121)
BUN/Creatinine Ratio: 8 — ABNORMAL LOW (ref 9–23)
BUN: 6 mg/dL (ref 6–20)
Bilirubin Total: <0.2 mg/dL (ref 0.0–1.2)
CO2: 19 mmol/L — ABNORMAL LOW (ref 20–29)
Calcium: 8.8 mg/dL (ref 8.7–10.2)
Chloride: 104 mmol/L (ref 96–106)
Creatinine, Ser: 0.71 mg/dL (ref 0.57–1.00)
Globulin, Total: 2.9 g/dL (ref 1.5–4.5)
Glucose: 96 mg/dL (ref 70–99)
Potassium: 4.3 mmol/L (ref 3.5–5.2)
Sodium: 138 mmol/L (ref 134–144)
Total Protein: 6.7 g/dL (ref 6.0–8.5)
eGFR: 121 mL/min/{1.73_m2} (ref >59)

## 2023-12-20 LAB — TSH: TSH: 1.31 u[IU]/mL (ref 0.450–4.500)

## 2023-12-20 LAB — HEMOGLOBIN A1C
Est. average glucose Bld gHb Est-mCnc: 114 mg/dL
Hgb A1c MFr Bld: 5.6 % (ref 4.8–5.6)

## 2023-12-28 ENCOUNTER — Encounter: Payer: Self-pay | Admitting: Urgent Care

## 2023-12-30 NOTE — Telephone Encounter (Signed)
 Yes, this would require an in office visit. Not appropriate for mychart. Please contact patient to schedule. Thank you so much.

## 2024-01-30 ENCOUNTER — Encounter: Payer: Self-pay | Admitting: Urgent Care

## 2024-02-29 ENCOUNTER — Encounter (HOSPITAL_COMMUNITY): Payer: Self-pay | Admitting: Psychiatry

## 2024-02-29 ENCOUNTER — Telehealth (HOSPITAL_BASED_OUTPATIENT_CLINIC_OR_DEPARTMENT_OTHER): Admitting: Psychiatry

## 2024-02-29 VITALS — Wt 295.0 lb

## 2024-02-29 DIAGNOSIS — F331 Major depressive disorder, recurrent, moderate: Secondary | ICD-10-CM

## 2024-02-29 DIAGNOSIS — F411 Generalized anxiety disorder: Secondary | ICD-10-CM | POA: Diagnosis not present

## 2024-02-29 DIAGNOSIS — F410 Panic disorder [episodic paroxysmal anxiety] without agoraphobia: Secondary | ICD-10-CM | POA: Diagnosis not present

## 2024-02-29 DIAGNOSIS — F1210 Cannabis abuse, uncomplicated: Secondary | ICD-10-CM

## 2024-02-29 MED ORDER — ESCITALOPRAM OXALATE 20 MG PO TABS: 20.0000 mg | ORAL_TABLET | Freq: Every day | ORAL | 2 refills | Status: DC

## 2024-02-29 MED ORDER — BREXPIPRAZOLE 1 MG PO TABS: 1.0000 mg | ORAL_TABLET | Freq: Every day | ORAL | 2 refills | Status: DC

## 2024-02-29 NOTE — Progress Notes (Signed)
 [facility] MD Virtual Progress Note   Patient Location: Home Provider Location: Home Office  I connect with patient by video and verified that I am speaking with correct person by using two identifiers. I discussed the limitations of evaluation and management by telemedicine and the availability of in person appointments. I also discussed with the patient that there may be a patient responsible charge related to this service. The patient expressed understanding and agreed to proceed.  Julie [name] [id number] 23 y.o.  02/29/2024 8:50 AM  History of Present Illness:  Patient is evaluated by video session.  She reported things are going okay.  She had a good summer.  She started school.  She is at [employer].  So far no issues and grades are good.  Patient lives with her mother and reported relationship is going well.  She stopped seeing therapist Staci because she felt she does not need it anymore.  Recently she had a sleep study and diagnosed with apnea and waiting for the appointment to get machine.  She has cut down vaping cannabis and this time she may mean it.  She denies any panic attack, crying spells, feeling of hopelessness or worthlessness.  Recently she had a visit with primary care and labs are drawn.  Hemoglobin A1c further improved.  She has anemia and she is taking iron supplements.  Her appetite is okay.  Her weight is unchanged from the past.  Sometimes she struggle with fatigue which could be due to low iron level.  She has not taken hydroxyzine  in a while but is still has refill remaining in case she needed for severe panic attack.  She has no issue going to the public places.  She wants to continue Rexulti  and Lexapro .  Past Psychiatric History: H/O inpatient in October 2019 due to severe depression and plan to kill herself.  Did PHP and IOP. No h/o mania, psychosis. Wellbutrin  did not work and Abilify  made tired and restless.  Hydroxyzine  helped  but discontinued after feeling better.   Past Medical History:  Diagnosis Date   Anxiety    CMV (cytomegalovirus infection) (HCC) 04/13/2018   Depression    Dysmenorrhea    Painful menstrual periods     Outpatient Encounter Medications as of 02/29/2024  Medication Sig   brexpiprazole  (REXULTI ) 1 MG TABS tablet Take 1 tablet (1 mg total) by mouth daily.   cetirizine (ZYRTEC) 10 MG tablet Take 10 mg by mouth daily.   escitalopram  (LEXAPRO ) 20 MG tablet Take 1 tablet (20 mg total) by mouth daily.   fluticasone  (FLONASE ) 50 MCG/ACT nasal spray Place 2 sprays into both nostrils daily.   metFORMIN  (GLUCOPHAGE -XR) 500 MG 24 hr tablet Take 1 tablet (500 mg total) by mouth daily with breakfast.   valACYclovir  (VALTREX ) 500 MG tablet Take 1 tablet (500 mg total) by mouth daily.   No facility-administered encounter medications on file as of 02/29/2024.    Recent Results (from the past 2160 hours)  CMP14+EGFR     Status: Abnormal   Collection Time: 12/19/23 10:30 AM  Result Value Ref Range   Glucose 96 70 - 99 mg/dL   BUN 6 6 - 20 mg/dL   Creatinine, Ser 9.28 0.57 - 1.00 mg/dL   eGFR 878 >40 fO/fpw/8.26   BUN/Creatinine Ratio 8 (L) 9 - 23   Sodium 138 134 - 144 mmol/L   Potassium 4.3 3.5 - 5.2 mmol/L   Chloride 104 96 - 106 mmol/L  CO2 19 (L) 20 - 29 mmol/L   Calcium 8.8 8.7 - 10.2 mg/dL   Total Protein 6.7 6.0 - 8.5 g/dL   Albumin 3.8 (L) 4.0 - 5.0 g/dL   Globulin, Total 2.9 1.5 - 4.5 g/dL   Bilirubin Total <9.7 0.0 - 1.2 mg/dL   Alkaline Phosphatase 49 44 - 121 IU/L   AST 15 0 - 40 IU/L   ALT 12 0 - 32 IU/L  CBC w/Diff/Platelet     Status: Abnormal   Collection Time: 12/19/23 10:30 AM  Result Value Ref Range   WBC 5.2 3.4 - 10.8 x10E3/uL   RBC 4.21 3.77 - 5.28 x10E6/uL   Hemoglobin 10.4 (L) 11.1 - 15.9 g/dL   Hematocrit 65.3 65.9 - 46.6 %   MCV 82 79 - 97 fL   MCH 24.7 (L) 26.6 - 33.0 pg   MCHC 30.1 (L) 31.5 - 35.7 g/dL   RDW 86.6 88.2 - 84.5 %   Platelets 355 150 - 450  x10E3/uL   Neutrophils 38 Not Estab. %   Lymphs 49 Not Estab. %   Monocytes 7 Not Estab. %   Eos 5 Not Estab. %   Basos 1 Not Estab. %   Neutrophils Absolute 2.0 1.4 - 7.0 x10E3/uL   Lymphocytes Absolute 2.5 0.7 - 3.1 x10E3/uL   Monocytes Absolute 0.4 0.1 - 0.9 x10E3/uL   EOS (ABSOLUTE) 0.3 0.0 - 0.4 x10E3/uL   Basophils Absolute 0.0 0.0 - 0.2 x10E3/uL   Immature Granulocytes 0 Not Estab. %   Immature Grans (Abs) 0.0 0.0 - 0.1 x10E3/uL  Fe+TIBC+Fer     Status: Abnormal   Collection Time: 12/19/23 10:30 AM  Result Value Ref Range   Total Iron Binding Capacity 296 250 - 450 ug/dL   UIBC 731 868 - 574 ug/dL   Iron 28 27 - 840 ug/dL   Iron Saturation 9 (LL) 15 - 55 %   Ferritin 34 15 - 150 ng/mL  HgB A1c     Status: None   Collection Time: 12/19/23 10:30 AM  Result Value Ref Range   Hgb A1c MFr Bld 5.6 4.8 - 5.6 %    Comment:          Prediabetes: 5.7 - 6.4          Diabetes: >6.4          Glycemic control for adults with diabetes: <7.0    Est. average glucose Bld gHb Est-mCnc 114 mg/dL  TSH     Status: None   Collection Time: 12/19/23 10:30 AM  Result Value Ref Range   TSH 1.310 0.450 - 4.500 uIU/mL     Psychiatric Specialty Exam: Physical Exam  Review of Systems  Weight 295 lb (133.8 kg).There is no height or weight on file to calculate BMI.  General Appearance: Casual  Eye Contact:  Good  Speech:  Clear and Coherent  Volume:  Normal  Mood:  Euthymic  Affect:  Appropriate  Thought Process:  Goal Directed  Orientation:  Full (Time, Place, and Person)  Thought Content:  Logical  Suicidal Thoughts:  No  Homicidal Thoughts:  No  Memory:  Immediate;   Good Recent;   Good Remote;   Good  Judgement:  Good  Insight:  Good  Psychomotor Activity:  Normal  Concentration:  Concentration: Good and Attention Span: Good  Recall:  Good  Fund of Knowledge:  Good  Language:  Good  Akathisia:  No  Handed:  Right  AIMS (if indicated):  Assets:  Communication  Skills Desire for Improvement Housing Resilience Social Support Talents/Skills Transportation  ADL's:  Intact  Cognition:  WNL  Sleep:  fair, waiting for apnea       11/30/2023   12:16 PM 12/10/2021    8:51 AM 05/03/2018    9:00 AM 04/18/2018   12:51 PM 04/18/2018    9:00 AM  Depression screen PHQ 2/9  Decreased Interest 0 1 1 2 3   Down, Depressed, Hopeless 0 1 2 2 3   PHQ - 2 Score 0 2 3 4 6   Altered sleeping  0 0 1 2  Tired, decreased energy  1 1 2 3   Change in appetite  0 3 2 1   Feeling bad or failure about yourself   2 2 2 3   Trouble concentrating  1 1 1 2   Moving slowly or fidgety/restless  1 0 1 0  Suicidal thoughts  1 2 2  0  PHQ-9 Score  8 12 15 17   Difficult doing work/chores  Very difficult       Assessment/Plan: MDD (major depressive disorder), recurrent episode, moderate (HCC) - Plan: brexpiprazole  (REXULTI ) 1 MG TABS tablet, escitalopram  (LEXAPRO ) 20 MG tablet  GAD (generalized anxiety disorder) - Plan: brexpiprazole  (REXULTI ) 1 MG TABS tablet, escitalopram  (LEXAPRO ) 20 MG tablet  Mild tetrahydrocannabinol (THC) abuse - Plan: brexpiprazole  (REXULTI ) 1 MG TABS tablet, escitalopram  (LEXAPRO ) 20 MG tablet  Panic attacks - Plan: brexpiprazole  (REXULTI ) 1 MG TABS tablet, escitalopram  (LEXAPRO ) 20 MG tablet  Patient is a 23 year old female with history of elevated sugar, sleep issues, anemia, history of elevated blood pressure, major depressive disorder, generalized anxiety disorder, mild cannabis use and panic attack.  Reviewed blood work results and collateral information from other provider.  Hemoglobin A1c improved.  She is concerned about sleep but recently had sleep study and given the diagnosis of apnea and she is waiting for the CPAP machine.  She does not need a new prescription of hydroxyzine  as not having major panic attack and does not need to use it.  She still has refills remaining.  She is no longer seeing therapist because she feels she does not need it.   Her long-term plan is to stop the cannabis and she is working on it.  Discussed medication side effects and benefits.  Continue Rexulti  1 mg daily and Lexapro  20 mg daily.  Recommended to call back if she is any question or any concern.  Follow-up in 3 months.   Follow Up Instructions:     I discussed the assessment and treatment plan with the patient. The patient was provided an opportunity to ask questions and all were answered. The patient agreed with the plan and demonstrated an understanding of the instructions.   The patient was advised to call back or seek an in-person evaluation if the symptoms worsen or if the condition fails to improve as anticipated.    Collaboration of Care: Other provider involved in patient's care AEB notes are available in epic to review  Patient/Guardian was advised Release of Information must be obtained prior to any record release in order to collaborate their care with an outside provider. Patient/Guardian was advised if they have not already done so to contact the registration department to sign all necessary forms in order for us  to release information regarding their care.   Consent: Patient/Guardian gives verbal consent for treatment and assignment of benefits for services provided during this visit. Patient/Guardian expressed understanding and agreed to proceed.     Total  encounter time 17 minutes which includes face-to-face time, chart reviewed, care coordination, order entry and documentation during this encounter.   Note: This document was prepared by [employer] voice dictation technology and any errors that results from this process are unintentional.    Leni ONEIDA Client, MD 02/29/2024

## 2024-03-21 ENCOUNTER — Other Ambulatory Visit: Payer: Self-pay | Admitting: Family Medicine

## 2024-03-21 DIAGNOSIS — R7303 Prediabetes: Secondary | ICD-10-CM

## 2024-04-04 ENCOUNTER — Ambulatory Visit: Admitting: Neurology

## 2024-04-10 ENCOUNTER — Telehealth (HOSPITAL_COMMUNITY): Payer: Self-pay

## 2024-04-10 NOTE — Telephone Encounter (Signed)
 Pt is requesting a refill of Lexapro  20 MG.  escitalopram  (LEXAPRO ) 20 MG tablet 20 mg, Daily         Summary: Take 1 tablet (20 mg total) by mouth daily., Starting Wed 02/29/2024, Until Tue 05/29/2024, Normal Dose, Route, Frequency: 20 mg, Oral, DailyStart: 09/10/2025End: 12/09/2025Ordered On: 09/10/2025Pharmacy: [id number] - [place], [place] - [address] [facility] [address] Associated: Taking: Long-term: Med Note:         Change Directions: Take 1 tablet (20 mg total) by mouth daily. Ordering Department: [facility] [place] Authorized By: Curry Leni DASEN, MD Dispense: 30 tablet Refills: 2 ordered

## 2024-04-10 NOTE — Telephone Encounter (Signed)
 Prescription send on 02/29/24 with two additional refills. Should have enough refills 06/01/24. Please inform patient to contact her pharmacy.

## 2024-04-19 ENCOUNTER — Encounter: Payer: Self-pay | Admitting: Urgent Care

## 2024-04-19 ENCOUNTER — Ambulatory Visit: Admitting: Urgent Care

## 2024-04-19 VITALS — BP 132/80 | HR 88 | Ht 65.0 in | Wt 286.0 lb

## 2024-04-19 DIAGNOSIS — Z23 Encounter for immunization: Secondary | ICD-10-CM

## 2024-04-19 DIAGNOSIS — R7303 Prediabetes: Secondary | ICD-10-CM

## 2024-04-19 DIAGNOSIS — D509 Iron deficiency anemia, unspecified: Secondary | ICD-10-CM | POA: Diagnosis not present

## 2024-04-19 DIAGNOSIS — G4733 Obstructive sleep apnea (adult) (pediatric): Secondary | ICD-10-CM | POA: Diagnosis not present

## 2024-04-19 MED ORDER — AMBULATORY NON FORMULARY MEDICATION: 0 refills | Status: AC

## 2024-04-19 NOTE — Patient Instructions (Addendum)
 Continue all medications as ordered. Will try to get a new DME company for your CPAP machine.  Please schedule your pap smear and follow up in 6 months for routine annual.

## 2024-04-19 NOTE — Progress Notes (Signed)
 Established Patient Office Visit  Subjective:  Patient ID: Julie [name], female    DOB: May 27, 2001  Age: 23 y.o. MRN: [id number]  Chief Complaint  Patient presents with   Follow-up    HPI  Discussed the use of AI scribe software for clinical note transcription with the patient, who gave verbal consent to proceed.  History of Present Illness   A 23 year old female presents for a follow-up visit regarding prediabetes.  She is currently taking metformin  and experiences gastrointestinal side effects only when she does not eat with the medication, which she manages by ensuring she eats when taking it.  Her blood pressure readings at home have been in the 120s to 130s mmHg range. She does not monitor her blood pressure regularly at home.  She underwent a sleep study and recalls being told that she has mild to moderate sleep apnea, with the lowest recorded oxygen saturation at 83%. She has not started using a CPAP machine due to financial constraints. She is amenable to seeing if an alternate DME company may be able to furnish the device for cheaper.  She is currently taking Rexulti , Lexapro , metformin , and iron. She is fasting for lab work today and is receiving a flu vaccine. She has never had a Pap smear and declines to have one today.       Patient Active Problem List   Diagnosis Date Noted   Missed periods 07/04/2023   Unprotected sexual intercourse 07/04/2023   Elevated blood pressure reading 03/15/2023   Seasonal allergies 03/15/2023   Microcytic anemia 03/15/2023   Prediabetes 03/15/2023   Screening examination for STI 03/15/2023   Allergy 03/13/2019   MDD (major depressive disorder), recurrent episode, severe (HCC) 04/11/2018   Snoring 12/25/2015   Past Medical History:  Diagnosis Date   Anxiety    CMV (cytomegalovirus infection) (HCC) 04/13/2018   Depression    Dysmenorrhea    Painful menstrual periods    Past Surgical History:  Procedure Laterality Date    TONSILLECTOMY     Social History   Tobacco Use   Smoking status: Never   Smokeless tobacco: Never  Vaping Use   Vaping status: Never Used  Substance Use Topics   Alcohol use: Never   Drug use: Yes    Types: Marijuana    Comment: Has done this twice and last time 1 week prior      ROS: as noted in HPI  Objective:     BP 132/80   Pulse 88   Ht 5' 5 (1.651 m)   Wt 286 lb (129.7 kg)   SpO2 96%   BMI 47.59 kg/m  BP Readings from Last 3 Encounters:  04/19/24 132/80  12/19/23 132/84  09/26/23 128/80   Wt Readings from Last 3 Encounters:  04/19/24 286 lb (129.7 kg)  12/19/23 295 lb (133.8 kg)  09/26/23 284 lb (128.8 kg)      Physical Exam Vitals and nursing note reviewed.  Constitutional:      General: She is not in acute distress.    Appearance: Normal appearance. She is not ill-appearing, toxic-appearing or diaphoretic.  HENT:     Head: Normocephalic and atraumatic.     Right Ear: External ear normal.     Left Ear: External ear normal.     Nose: Nose normal.     Mouth/Throat:     Mouth: Mucous membranes are moist.     Pharynx: Oropharynx is clear. No oropharyngeal exudate or posterior oropharyngeal erythema.  Comments: Mallampati score 4 Eyes:     General: No scleral icterus.       Right eye: No discharge.        Left eye: No discharge.     Extraocular Movements: Extraocular movements intact.     Pupils: Pupils are equal, round, and reactive to light.  Neck:     Thyroid : No thyroid  mass, thyromegaly or thyroid  tenderness.  Cardiovascular:     Rate and Rhythm: Normal rate and regular rhythm.     Pulses: Normal pulses.     Heart sounds: No murmur heard. Pulmonary:     Effort: Pulmonary effort is normal. No respiratory distress.     Breath sounds: Normal breath sounds. No stridor. No wheezing or rhonchi.  Abdominal:     General: Bowel sounds are normal.  Musculoskeletal:     Cervical back: Normal range of motion and neck supple. No rigidity or  tenderness.  Lymphadenopathy:     Cervical: No cervical adenopathy.  Skin:    General: Skin is warm and dry.     Coloration: Skin is not jaundiced.     Findings: No bruising, erythema or rash.  Neurological:     General: No focal deficit present.     Mental Status: She is alert and oriented to person, place, and time.  Psychiatric:        Mood and Affect: Mood normal.        Behavior: Behavior normal.      No results found for any visits on 04/19/24.  Last CBC Lab Results  Component Value Date   WBC 5.2 12/19/2023   HGB 10.4 (L) 12/19/2023   HCT 34.6 12/19/2023   MCV 82 12/19/2023   MCH 24.7 (L) 12/19/2023   RDW 13.3 12/19/2023   PLT 355 12/19/2023   Last metabolic panel Lab Results  Component Value Date   GLUCOSE 96 12/19/2023   NA 138 12/19/2023   K 4.3 12/19/2023   CL 104 12/19/2023   CO2 19 (L) 12/19/2023   BUN 6 12/19/2023   CREATININE 0.71 12/19/2023   EGFR 121 12/19/2023   CALCIUM 8.8 12/19/2023   PROT 6.7 12/19/2023   ALBUMIN 3.8 (L) 12/19/2023   LABGLOB 2.9 12/19/2023   BILITOT <0.2 12/19/2023   ALKPHOS 49 12/19/2023   AST 15 12/19/2023   ALT 12 12/19/2023   ANIONGAP 9 10/29/2021   Last lipids Lab Results  Component Value Date   CHOL 177 04/11/2018   HDL 40 (L) 04/11/2018   LDLCALC 116 (H) 04/11/2018   TRIG 107 04/11/2018   CHOLHDL 4.4 04/11/2018   Last hemoglobin A1c Lab Results  Component Value Date   HGBA1C 5.6 12/19/2023   Last thyroid  functions Lab Results  Component Value Date   TSH 1.310 12/19/2023   Last vitamin D No results found for: 25OHVITD2, 25OHVITD3, VD25OH Last vitamin B12 and Folate No results found for: VITAMINB12, FOLATE    The ASCVD Risk score (Arnett DK, et al., 2019) failed to calculate for the following reasons:   The 2019 ASCVD risk score is only valid for ages 2 to 15  Assessment & Plan:  Prediabetes -     CBC with Differential/Platelet -     Hemoglobin A1c -     TSH -     Lipid panel -      Comprehensive metabolic panel with GFR  Obstructive sleep apnea syndrome  Microcytic anemia -     CBC with Differential/Platelet  Immunization due -  Flu vaccine trivalent PF, 6mos and older(Flulaval,Afluria,Fluarix,Fluzone)   Assessment and Plan    Hypertension Blood pressure elevated at 153/84 mmHg. Current reading may be inaccurate due to improperly sized cuff.  Prediabetes Managed with metformin , tolerated well with food.  Obstructive sleep apnea, moderate Moderate obstructive sleep apnea with lowest oxygen saturation at 83%. CPAP therapy not started due to financial constraints. Insurance may cover after six-month payment plan. - Explore new DME company for CPAP machine.  Iron deficiency anemia On iron supplementation.  Depression Managed with Rexulti .  General Health Maintenance Receiving flu vaccine today. Refused Pap smear today. Fasting for lab work. - Administer flu vaccine. - Schedule Pap smear for next month.        Return in about 6 months (around 10/18/2024) for Annual Physical.   Benton LITTIE Gave, PA

## 2024-04-20 ENCOUNTER — Ambulatory Visit: Payer: Self-pay | Admitting: Urgent Care

## 2024-04-20 LAB — COMPREHENSIVE METABOLIC PANEL WITH GFR
ALT: 9 IU/L (ref 0–32)
AST: 13 IU/L (ref 0–40)
Albumin: 4.2 g/dL (ref 4.0–5.0)
Alkaline Phosphatase: 56 IU/L (ref 41–116)
BUN/Creatinine Ratio: 9 (ref 9–23)
BUN: 7 mg/dL (ref 6–20)
Bilirubin Total: 0.4 mg/dL (ref 0.0–1.2)
CO2: 18 mmol/L — ABNORMAL LOW (ref 20–29)
Calcium: 9.1 mg/dL (ref 8.7–10.2)
Chloride: 103 mmol/L (ref 96–106)
Creatinine, Ser: 0.78 mg/dL (ref 0.57–1.00)
Globulin, Total: 3.1 g/dL (ref 1.5–4.5)
Glucose: 96 mg/dL (ref 70–99)
Potassium: 4.4 mmol/L (ref 3.5–5.2)
Sodium: 137 mmol/L (ref 134–144)
Total Protein: 7.3 g/dL (ref 6.0–8.5)
eGFR: 108 mL/min/1.73 (ref >59)

## 2024-04-20 LAB — LIPID PANEL
Chol/HDL Ratio: 5 ratio — ABNORMAL HIGH (ref 0.0–4.4)
Cholesterol, Total: 184 mg/dL (ref 100–199)
HDL: 37 mg/dL — ABNORMAL LOW (ref >39)
LDL Chol Calc (NIH): 135 mg/dL — ABNORMAL HIGH (ref 0–99)
Triglycerides: 62 mg/dL (ref 0–149)
VLDL Cholesterol Cal: 12 mg/dL (ref 5–40)

## 2024-04-20 LAB — CBC WITH DIFFERENTIAL/PLATELET
Basophils Absolute: 0.1 x10E3/uL (ref 0.0–0.2)
Basos: 1 %
EOS (ABSOLUTE): 0.3 x10E3/uL (ref 0.0–0.4)
Eos: 5 %
Hematocrit: 34.7 % (ref 34.0–46.6)
Hemoglobin: 10.6 g/dL — ABNORMAL LOW (ref 11.1–15.9)
Immature Grans (Abs): 0 x10E3/uL (ref 0.0–0.1)
Immature Granulocytes: 0 %
Lymphocytes Absolute: 2.7 x10E3/uL (ref 0.7–3.1)
Lymphs: 39 %
MCH: 24.5 pg — ABNORMAL LOW (ref 26.6–33.0)
MCHC: 30.5 g/dL — ABNORMAL LOW (ref 31.5–35.7)
MCV: 80 fL (ref 79–97)
Monocytes Absolute: 0.6 x10E3/uL (ref 0.1–0.9)
Monocytes: 8 %
Neutrophils Absolute: 3.3 x10E3/uL (ref 1.4–7.0)
Neutrophils: 47 %
Platelets: 407 x10E3/uL (ref 150–450)
RBC: 4.32 x10E6/uL (ref 3.77–5.28)
RDW: 13.5 % (ref 11.7–15.4)
WBC: 6.9 x10E3/uL (ref 3.4–10.8)

## 2024-04-20 LAB — HEMOGLOBIN A1C
Est. average glucose Bld gHb Est-mCnc: 117 mg/dL
Hgb A1c MFr Bld: 5.7 % — ABNORMAL HIGH (ref 4.8–5.6)

## 2024-04-20 LAB — TSH: TSH: 1.81 u[IU]/mL (ref 0.450–4.500)

## 2024-05-03 ENCOUNTER — Other Ambulatory Visit (HOSPITAL_COMMUNITY)
Admission: RE | Admit: 2024-05-03 | Discharge: 2024-05-03 | Disposition: A | Source: Ambulatory Visit | Attending: Urgent Care | Admitting: Urgent Care

## 2024-05-03 ENCOUNTER — Ambulatory Visit: Admitting: Urgent Care

## 2024-05-03 ENCOUNTER — Encounter: Payer: Self-pay | Admitting: Urgent Care

## 2024-05-03 VITALS — BP 129/78 | HR 83 | Ht 65.0 in | Wt 286.0 lb

## 2024-05-03 DIAGNOSIS — Z124 Encounter for screening for malignant neoplasm of cervix: Secondary | ICD-10-CM | POA: Diagnosis not present

## 2024-05-03 NOTE — Progress Notes (Unsigned)
 Established Patient Office Visit  Subjective:  Patient ID: Julie [name], female    DOB: 10-04-2000  Age: 23 y.o. MRN: [id number]  Chief Complaint  Patient presents with   Gynecologic Exam    Pt here to update pap smear. No prior paps completed. No gynecologic complaints today.  Gynecologic Exam    Patient Active Problem List   Diagnosis Date Noted   Missed periods 07/04/2023   Unprotected sexual intercourse 07/04/2023   Elevated blood pressure reading 03/15/2023   Seasonal allergies 03/15/2023   Microcytic anemia 03/15/2023   Prediabetes 03/15/2023   Screening examination for STI 03/15/2023   Allergy 03/13/2019   MDD (major depressive disorder), recurrent episode, severe (HCC) 04/11/2018   Snoring 12/25/2015   Past Medical History:  Diagnosis Date   Anxiety    CMV (cytomegalovirus infection) (HCC) 04/13/2018   Depression    Dysmenorrhea    Painful menstrual periods    Past Surgical History:  Procedure Laterality Date   TONSILLECTOMY     Social History   Tobacco Use   Smoking status: Never   Smokeless tobacco: Never  Vaping Use   Vaping status: Never Used  Substance Use Topics   Alcohol use: Never   Drug use: Yes    Types: Marijuana    Comment: Has done this twice and last time 1 week prior      ROS: as noted in HPI  Objective:     BP 129/78   Pulse 83   Ht 5' 5 (1.651 m)   Wt 286 lb (129.7 kg)   SpO2 95%   BMI 47.59 kg/m  BP Readings from Last 3 Encounters:  05/03/24 129/78  04/19/24 132/80  12/19/23 132/84   Wt Readings from Last 3 Encounters:  05/03/24 286 lb (129.7 kg)  04/19/24 286 lb (129.7 kg)  12/19/23 295 lb (133.8 kg)      Physical Exam Exam conducted with a chaperone present.  Genitourinary:    Pubic Area: No rash or pubic lice.      Labia:        Right: No rash, tenderness, lesion or injury.        Left: No rash, tenderness, lesion or injury.      Urethra: No prolapse, urethral pain, urethral swelling or urethral  lesion.     Vagina: Normal. No vaginal discharge or lesions.     Cervix: Normal. No cervical motion tenderness, discharge, friability or lesion.     Uterus: Normal.      Adnexa: Right adnexa normal and left adnexa normal.       Right: No mass, tenderness or fullness.         Left: No mass, tenderness or fullness.       Rectum: Normal.  Lymphadenopathy:     Lower Body: No right inguinal adenopathy. No left inguinal adenopathy.      No results found for any visits on 05/03/24.  Last CBC Lab Results  Component Value Date   WBC 6.9 04/19/2024   HGB 10.6 (L) 04/19/2024   HCT 34.7 04/19/2024   MCV 80 04/19/2024   MCH 24.5 (L) 04/19/2024   RDW 13.5 04/19/2024   PLT 407 04/19/2024      The ASCVD Risk score (Arnett DK, et al., 2019) failed to calculate for the following reasons:   The 2019 ASCVD risk score is only valid for ages 10 to 27  Assessment & Plan:  Cervical cancer screening -     Cytology - PAP  Pap smear updated today. Pt to return in 6 months for routine A1C follow up.  Return in about 6 months (around 10/31/2024).   Benton LITTIE Gave, PA

## 2024-05-04 ENCOUNTER — Ambulatory Visit: Payer: Self-pay | Admitting: Urgent Care

## 2024-05-04 LAB — CYTOLOGY - PAP
Comment: NEGATIVE
Diagnosis: NEGATIVE
High risk HPV: NEGATIVE

## 2024-05-12 ENCOUNTER — Other Ambulatory Visit (HOSPITAL_COMMUNITY): Payer: Self-pay | Admitting: Psychiatry

## 2024-05-12 DIAGNOSIS — F410 Panic disorder [episodic paroxysmal anxiety] without agoraphobia: Secondary | ICD-10-CM

## 2024-05-12 DIAGNOSIS — F331 Major depressive disorder, recurrent, moderate: Secondary | ICD-10-CM

## 2024-05-12 DIAGNOSIS — F411 Generalized anxiety disorder: Secondary | ICD-10-CM

## 2024-05-12 DIAGNOSIS — F1210 Cannabis abuse, uncomplicated: Secondary | ICD-10-CM

## 2024-05-30 ENCOUNTER — Telehealth (HOSPITAL_COMMUNITY): Admitting: Psychiatry

## 2024-06-11 ENCOUNTER — Other Ambulatory Visit (HOSPITAL_COMMUNITY): Payer: Self-pay | Admitting: *Deleted

## 2024-06-11 DIAGNOSIS — F1210 Cannabis abuse, uncomplicated: Secondary | ICD-10-CM

## 2024-06-11 DIAGNOSIS — F410 Panic disorder [episodic paroxysmal anxiety] without agoraphobia: Secondary | ICD-10-CM

## 2024-06-11 DIAGNOSIS — F411 Generalized anxiety disorder: Secondary | ICD-10-CM

## 2024-06-11 DIAGNOSIS — F331 Major depressive disorder, recurrent, moderate: Secondary | ICD-10-CM

## 2024-06-11 MED ORDER — ESCITALOPRAM OXALATE 20 MG PO TABS: 20.0000 mg | ORAL_TABLET | Freq: Every day | ORAL | 0 refills | Status: AC

## 2024-06-25 ENCOUNTER — Telehealth (HOSPITAL_COMMUNITY): Admitting: Psychiatry

## 2024-06-25 ENCOUNTER — Encounter (HOSPITAL_COMMUNITY): Payer: Self-pay | Admitting: Psychiatry

## 2024-06-25 VITALS — Wt 286.0 lb

## 2024-06-25 DIAGNOSIS — F1210 Cannabis abuse, uncomplicated: Secondary | ICD-10-CM

## 2024-06-25 DIAGNOSIS — F410 Panic disorder [episodic paroxysmal anxiety] without agoraphobia: Secondary | ICD-10-CM | POA: Diagnosis not present

## 2024-06-25 DIAGNOSIS — F331 Major depressive disorder, recurrent, moderate: Secondary | ICD-10-CM | POA: Diagnosis not present

## 2024-06-25 DIAGNOSIS — F411 Generalized anxiety disorder: Secondary | ICD-10-CM | POA: Diagnosis not present

## 2024-06-25 MED ORDER — HYDROXYZINE HCL 25 MG PO TABS: 25.0000 mg | ORAL_TABLET | Freq: Every day | ORAL | 0 refills | Status: AC | PRN

## 2024-06-25 MED ORDER — BREXPIPRAZOLE 0.5 MG PO TABS: 0.5000 mg | ORAL_TABLET | Freq: Every day | ORAL | 2 refills | Status: DC

## 2024-06-25 MED ORDER — ESCITALOPRAM OXALATE 10 MG PO TABS: 10.0000 mg | ORAL_TABLET | Freq: Every day | ORAL | 2 refills | Status: AC

## 2024-06-25 NOTE — Progress Notes (Signed)
 " [facility] MD Virtual Progress Note   Patient Location: Home Provider Location: Home Office  I connect with patient by video and verified that I am speaking with correct person by using two identifiers. I discussed the limitations of evaluation and management by telemedicine and the availability of in person appointments. I also discussed with the patient that there may be a patient responsible charge related to this service. The patient expressed understanding and agreed to proceed.  Julie [name] [id number] 24 y.o.  06/25/2024 2:48 PM  History of Present Illness:  Patient is evaluated by video session.  She reported things are going well and now she like to come off from the medication slowly and gradually.  Still not able to find a job but her school is going well.  She is studying arts at [employer].  Her grades are good.  She lives with her mother and she reported relationship with the mother is going very well.  She also had plan to stop the vaping.  She made new urine resolution and this is her last bottle.  She is taking Lexapro  and Rexulti .  Occasionally she takes hydroxyzine  which was given last year but she still has few remaining.  She takes when she feels very nervous or anxious or having any panic attack.  She overall feels things are going okay.  She denies any crying spells, feeling of hopelessness or worthlessness.  She has no tremor or shakes or any EPS.  She denies any paranoia or any hallucination.  Past Psychiatric History: H/O inpatient in October 2019 due to severe depression and plan to kill herself.  Did PHP and IOP. No h/o mania, psychosis. Wellbutrin  did not work and Abilify  made tired and restless.  Hydroxyzine  helped but discontinued after feeling better.   Past Medical History:  Diagnosis Date   Anxiety    CMV (cytomegalovirus infection) (HCC) 04/13/2018   Depression    Dysmenorrhea    Painful menstrual periods     Outpatient Encounter  Medications as of 06/25/2024  Medication Sig   AMBULATORY NON FORMULARY MEDICATION Continuous positive airway pressure (CPAP) machine set on AutoPAP (4-20 cmH2O), with all supplemental supplies as needed.   brexpiprazole  (REXULTI ) 1 MG TABS tablet Take 1 tablet (1 mg total) by mouth daily.   cetirizine (ZYRTEC) 10 MG tablet Take 10 mg by mouth daily.   escitalopram  (LEXAPRO ) 20 MG tablet Take 1 tablet (20 mg total) by mouth daily.   ferrous sulfate  325 (65 FE) MG EC tablet Take 1 tablet by mouth daily.   fluticasone  (FLONASE ) 50 MCG/ACT nasal spray Place 2 sprays into both nostrils daily.   metFORMIN  (GLUCOPHAGE -XR) 500 MG 24 hr tablet TAKE 1 TABLET(500 MG) BY MOUTH DAILY WITH BREAKFAST   valACYclovir  (VALTREX ) 500 MG tablet Take 1 tablet (500 mg total) by mouth daily.   No facility-administered encounter medications on file as of 06/25/2024.    Recent Results (from the past 2160 hours)  CBC with Differential/Platelet     Status: Abnormal   Collection Time: 04/19/24 10:16 AM  Result Value Ref Range   WBC 6.9 3.4 - 10.8 x10E3/uL   RBC 4.32 3.77 - 5.28 x10E6/uL   Hemoglobin 10.6 (L) 11.1 - 15.9 g/dL   Hematocrit 65.2 65.9 - 46.6 %   MCV 80 79 - 97 fL   MCH 24.5 (L) 26.6 - 33.0 pg   MCHC 30.5 (L) 31.5 - 35.7 g/dL   RDW 86.4 88.2 - 84.5 %  Platelets 407 150 - 450 x10E3/uL   Neutrophils 47 Not Estab. %   Lymphs 39 Not Estab. %   Monocytes 8 Not Estab. %   Eos 5 Not Estab. %   Basos 1 Not Estab. %   Neutrophils Absolute 3.3 1.4 - 7.0 x10E3/uL   Lymphocytes Absolute 2.7 0.7 - 3.1 x10E3/uL   Monocytes Absolute 0.6 0.1 - 0.9 x10E3/uL   EOS (ABSOLUTE) 0.3 0.0 - 0.4 x10E3/uL   Basophils Absolute 0.1 0.0 - 0.2 x10E3/uL   Immature Granulocytes 0 Not Estab. %   Immature Grans (Abs) 0.0 0.0 - 0.1 x10E3/uL  Hemoglobin A1c     Status: Abnormal   Collection Time: 04/19/24 10:16 AM  Result Value Ref Range   Hgb A1c MFr Bld 5.7 (H) 4.8 - 5.6 %    Comment:          Prediabetes: 5.7 - 6.4           Diabetes: >6.4          Glycemic control for adults with diabetes: <7.0    Est. average glucose Bld gHb Est-mCnc 117 mg/dL  TSH     Status: None   Collection Time: 04/19/24 10:16 AM  Result Value Ref Range   TSH 1.810 0.450 - 4.500 uIU/mL  Lipid panel     Status: Abnormal   Collection Time: 04/19/24 10:16 AM  Result Value Ref Range   Cholesterol, Total 184 100 - 199 mg/dL   Triglycerides 62 0 - 149 mg/dL   HDL 37 (L) >60 mg/dL   VLDL Cholesterol Cal 12 5 - 40 mg/dL   LDL Chol Calc (NIH) 864 (H) 0 - 99 mg/dL   Chol/HDL Ratio 5.0 (H) 0.0 - 4.4 ratio    Comment:                                   T. Chol/HDL Ratio                                             Men  Women                               1/2 Avg.Risk  3.4    3.3                                   Avg.Risk  5.0    4.4                                2X Avg.Risk  9.6    7.1                                3X Avg.Risk 23.4   11.0   Comprehensive metabolic panel with GFR     Status: Abnormal   Collection Time: 04/19/24 10:16 AM  Result Value Ref Range   Glucose 96 70 - 99 mg/dL   BUN 7 6 - 20 mg/dL   Creatinine, Ser 9.21 0.57 - 1.00 mg/dL   eGFR 891 >40 fO/fpw/8.26   BUN/Creatinine  Ratio 9 9 - 23   Sodium 137 134 - 144 mmol/L   Potassium 4.4 3.5 - 5.2 mmol/L   Chloride 103 96 - 106 mmol/L   CO2 18 (L) 20 - 29 mmol/L   Calcium 9.1 8.7 - 10.2 mg/dL   Total Protein 7.3 6.0 - 8.5 g/dL   Albumin 4.2 4.0 - 5.0 g/dL   Globulin, Total 3.1 1.5 - 4.5 g/dL   Bilirubin Total 0.4 0.0 - 1.2 mg/dL   Alkaline Phosphatase 56 41 - 116 IU/L   AST 13 0 - 40 IU/L   ALT 9 0 - 32 IU/L  Cytology - PAP     Status: None   Collection Time: 05/03/24 10:08 AM  Result Value Ref Range   High risk HPV Negative    Adequacy      Satisfactory for evaluation; transformation zone component PRESENT.   Diagnosis      - Negative for intraepithelial lesion or malignancy (NILM)   Comment Normal Reference Range HPV - Negative      Psychiatric Specialty  Exam: Physical Exam  Review of Systems  There were no vitals taken for this visit.There is no height or weight on file to calculate BMI.  General Appearance: Casual  Eye Contact:  Good  Speech:  Clear and Coherent  Volume:  Normal  Mood:  Euthymic  Affect:  Appropriate  Thought Process:  Goal Directed  Orientation:  Full (Time, Place, and Person)  Thought Content:  Logical  Suicidal Thoughts:  No  Homicidal Thoughts:  No  Memory:  Immediate;   Good Recent;   Good Remote;   Good  Judgement:  Good  Insight:  Good  Psychomotor Activity:  Normal  Concentration:  Concentration: Good and Attention Span: Good  Recall:  Good  Fund of Knowledge:  Good  Language:  Good  Akathisia:  No  Handed:  Right  AIMS (if indicated):     Assets:  Communication Skills Desire for Improvement Housing Resilience Social Support Talents/Skills Transportation  ADL's:  Intact  Cognition:  WNL  Sleep:  fair, waiting for apnea       11/30/2023   12:16 PM 12/10/2021    8:51 AM 05/03/2018    9:00 AM 04/18/2018   12:51 PM 04/18/2018    9:00 AM  Depression screen PHQ 2/9  Decreased Interest 0 1 1 2 3   Down, Depressed, Hopeless 0 1 2 2 3   PHQ - 2 Score 0 2 3 4 6   Altered sleeping  0 0 1 2  Tired, decreased energy  1 1 2 3   Change in appetite  0 3 2 1   Feeling bad or failure about yourself   2 2 2 3   Trouble concentrating  1 1 1 2   Moving slowly or fidgety/restless  1 0 1 0  Suicidal thoughts  1 2 2  0  PHQ-9 Score  8  12  15  17    Difficult doing work/chores  Very difficult        Data saved with a previous flowsheet row definition    Assessment/Plan: Panic attacks - Plan: Brexpiprazole  (REXULTI ) 0.5 MG TABS, escitalopram  (LEXAPRO ) 10 MG tablet  MDD (major depressive disorder), recurrent episode, moderate (HCC) - Plan: Brexpiprazole  (REXULTI ) 0.5 MG TABS, escitalopram  (LEXAPRO ) 10 MG tablet, hydrOXYzine  (ATARAX ) 25 MG tablet  GAD (generalized anxiety disorder) - Plan: Brexpiprazole   (REXULTI ) 0.5 MG TABS, escitalopram  (LEXAPRO ) 10 MG tablet, hydrOXYzine  (ATARAX ) 25 MG tablet  Mild tetrahydrocannabinol (THC) abuse - Plan: Brexpiprazole  (  REXULTI ) 0.5 MG TABS, escitalopram  (LEXAPRO ) 10 MG tablet, hydrOXYzine  (ATARAX ) 25 MG tablet  Patient is a 24 year old female with history of elevated sugar, sleep issues, anemia, history of elevated blood pressure, major depressive disorder, generalized anxiety disorder, mild cannabis use and panic attack.  Patient like to come off from the medication and thinking to tapering off.  She has been doing very well for past 7 years with the medication but also want to try to come off.  Discussed risk benefits and pros and cons.  Cut down the Rexulti  from 1 mg to 0.5 mg and Lexapro  from 20 mg to 10 mg.  I will provide a new prescription of hydroxyzine  as old prescription is not expired.  Encouraged if symptoms coming back then she should call back and contact us .  Otherwise we will follow-up in 3 months.  She is determined this time to stop the cannabis use.  Follow Up Instructions:     I discussed the assessment and treatment plan with the patient. The patient was provided an opportunity to ask questions and all were answered. The patient agreed with the plan and demonstrated an understanding of the instructions.   The patient was advised to call back or seek an in-person evaluation if the symptoms worsen or if the condition fails to improve as anticipated.    Collaboration of Care: Other provider involved in patient's care AEB notes are available in epic to review  Patient/Guardian was advised Release of Information must be obtained prior to any record release in order to collaborate their care with an outside provider. Patient/Guardian was advised if they have not already done so to contact the registration department to sign all necessary forms in order for us  to release information regarding their care.   Consent: Patient/Guardian gives verbal  consent for treatment and assignment of benefits for services provided during this visit. Patient/Guardian expressed understanding and agreed to proceed.     Total encounter time 18 minutes which includes face-to-face time, chart reviewed, care coordination, order entry and documentation during this encounter.   Note: This document was prepared by [employer] voice dictation technology and any errors that results from this process are unintentional.    Leni ONEIDA Client, MD 06/25/2024   "

## 2024-07-18 ENCOUNTER — Ambulatory Visit: Admitting: Urgent Care

## 2024-07-18 ENCOUNTER — Encounter: Payer: Self-pay | Admitting: Urgent Care

## 2024-07-18 VITALS — BP 119/85 | HR 104 | Ht 65.0 in | Wt 272.0 lb

## 2024-07-18 DIAGNOSIS — R41840 Attention and concentration deficit: Secondary | ICD-10-CM | POA: Diagnosis not present

## 2024-07-18 DIAGNOSIS — H6991 Unspecified Eustachian tube disorder, right ear: Secondary | ICD-10-CM

## 2024-07-18 DIAGNOSIS — G5711 Meralgia paresthetica, right lower limb: Secondary | ICD-10-CM | POA: Diagnosis not present

## 2024-07-18 MED ORDER — AMPHETAMINE-DEXTROAMPHET ER 10 MG PO CP24: 10.0000 mg | ORAL_CAPSULE | Freq: Every day | ORAL | 0 refills | Status: AC

## 2024-07-18 NOTE — Progress Notes (Unsigned)
 SABRA

## 2024-07-18 NOTE — Patient Instructions (Signed)
 Please start Adderall once tablet daily. Monitor heart rate and blood pressure.  Please do the exercises for meralgia paresthetica. You can purchase OTC lidocaine  patches to help.  Please follow up with ENT.  Return in 3 months, sooner if needed

## 2024-07-18 NOTE — Progress Notes (Unsigned)
 "  Established Patient Office Visit  Subjective:  Patient ID: Julie [name], female    DOB: 2000/11/26  Age: 24 y.o. MRN: [id number]  Chief Complaint  Patient presents with   Numbness    Outer right thigh periodically    HPI  Patient Active Problem List   Diagnosis Date Noted   Missed periods 07/04/2023   Unprotected sexual intercourse 07/04/2023   Elevated blood pressure reading 03/15/2023   Seasonal allergies 03/15/2023   Microcytic anemia 03/15/2023   Prediabetes 03/15/2023   Screening examination for STI 03/15/2023   Allergy 03/13/2019   MDD (major depressive disorder), recurrent episode, severe (HCC) 04/11/2018   Snoring 12/25/2015   Past Medical History:  Diagnosis Date   Anxiety    CMV (cytomegalovirus infection) (HCC) 04/13/2018   Depression    Dysmenorrhea    Painful menstrual periods    Past Surgical History:  Procedure Laterality Date   TONSILLECTOMY     Social History[1]    ROS: as noted in HPI  Objective:     BP 119/85   Pulse (!) 104   Ht 5' 5 (1.651 m)   Wt 272 lb (123.4 kg)   SpO2 95%   BMI 45.26 kg/m  BP Readings from Last 3 Encounters:  07/18/24 119/85  05/03/24 129/78  04/19/24 132/80   Wt Readings from Last 3 Encounters:  07/18/24 272 lb (123.4 kg)  05/03/24 286 lb (129.7 kg)  04/19/24 286 lb (129.7 kg)      Physical Exam Vitals and nursing note reviewed. Exam conducted with a chaperone present.  Constitutional:      General: She is not in acute distress.    Appearance: Normal appearance. She is obese. She is not ill-appearing, toxic-appearing or diaphoretic.  HENT:     Head: Normocephalic and atraumatic.  Neurological:     Mental Status: She is alert.      No results found for any visits on 07/18/24.  Last CBC Lab Results  Component Value Date   WBC 6.9 04/19/2024   HGB 10.6 (L) 04/19/2024   HCT 34.7 04/19/2024   MCV 80 04/19/2024   MCH 24.5 (L) 04/19/2024   RDW 13.5 04/19/2024   PLT 407 04/19/2024   Last  metabolic panel Lab Results  Component Value Date   GLUCOSE 96 04/19/2024   NA 137 04/19/2024   K 4.4 04/19/2024   CL 103 04/19/2024   CO2 18 (L) 04/19/2024   BUN 7 04/19/2024   CREATININE 0.78 04/19/2024   EGFR 108 04/19/2024   CALCIUM 9.1 04/19/2024   PROT 7.3 04/19/2024   ALBUMIN 4.2 04/19/2024   LABGLOB 3.1 04/19/2024   BILITOT 0.4 04/19/2024   ALKPHOS 56 04/19/2024   AST 13 04/19/2024   ALT 9 04/19/2024   ANIONGAP 9 10/29/2021   Last lipids Lab Results  Component Value Date   CHOL 184 04/19/2024   HDL 37 (L) 04/19/2024   LDLCALC 135 (H) 04/19/2024   TRIG 62 04/19/2024   CHOLHDL 5.0 (H) 04/19/2024   Last hemoglobin A1c Lab Results  Component Value Date   HGBA1C 5.7 (H) 04/19/2024   Last thyroid  functions Lab Results  Component Value Date   TSH 1.810 04/19/2024   Last vitamin D No results found for: 25OHVITD2, 25OHVITD3, VD25OH Last vitamin B12 and Folate No results found for: VITAMINB12, FOLATE    The ASCVD Risk score (Arnett DK, et al., 2019) failed to calculate for the following reasons:   The 2019 ASCVD risk score is only  valid for ages 41 to 94   * - Cholesterol units were assumed  Assessment & Plan:  There are no diagnoses linked to this encounter.   No follow-ups on file.   Benton LITTIE Gave, PA     [1]  Social History Tobacco Use   Smoking status: Never   Smokeless tobacco: Never  Vaping Use   Vaping status: Never Used  Substance Use Topics   Alcohol use: Never   Drug use: Yes    Types: Marijuana    Comment: Has done this twice and last time 1 week prior   "

## 2024-07-25 ENCOUNTER — Encounter: Payer: Self-pay | Admitting: Urgent Care

## 2024-08-06 ENCOUNTER — Institutional Professional Consult (permissible substitution) (INDEPENDENT_AMBULATORY_CARE_PROVIDER_SITE_OTHER): Admitting: Physician Assistant

## 2024-09-24 ENCOUNTER — Telehealth (HOSPITAL_COMMUNITY): Admitting: Psychiatry

## 2024-10-16 ENCOUNTER — Ambulatory Visit: Admitting: Urgent Care

## 2024-10-18 ENCOUNTER — Encounter: Admitting: Urgent Care

## 2024-10-31 ENCOUNTER — Ambulatory Visit: Admitting: Urgent Care
